# Patient Record
Sex: Female | Born: 1970 | Race: White | Hispanic: No | Marital: Married | Smoking: Former smoker
Health system: Southern US, Community
[De-identification: ages and names within clinical notes are randomized; demographics above are authoritative.]

## PROBLEM LIST (undated history)

## (undated) DIAGNOSIS — D649 Anemia, unspecified: Secondary | ICD-10-CM

## (undated) DIAGNOSIS — E119 Type 2 diabetes mellitus without complications: Secondary | ICD-10-CM

## (undated) DIAGNOSIS — I10 Essential (primary) hypertension: Secondary | ICD-10-CM

## (undated) DIAGNOSIS — E039 Hypothyroidism, unspecified: Secondary | ICD-10-CM

## (undated) DIAGNOSIS — E7800 Pure hypercholesterolemia, unspecified: Secondary | ICD-10-CM

## (undated) DIAGNOSIS — E079 Disorder of thyroid, unspecified: Secondary | ICD-10-CM

## (undated) DIAGNOSIS — K5790 Diverticulosis of intestine, part unspecified, without perforation or abscess without bleeding: Secondary | ICD-10-CM

## (undated) DIAGNOSIS — J45909 Unspecified asthma, uncomplicated: Secondary | ICD-10-CM

## (undated) DIAGNOSIS — K640 First degree hemorrhoids: Secondary | ICD-10-CM

## (undated) DIAGNOSIS — J189 Pneumonia, unspecified organism: Secondary | ICD-10-CM

## (undated) DIAGNOSIS — IMO0001 Reserved for inherently not codable concepts without codable children: Secondary | ICD-10-CM

## (undated) DIAGNOSIS — K219 Gastro-esophageal reflux disease without esophagitis: Secondary | ICD-10-CM

## (undated) DIAGNOSIS — R011 Cardiac murmur, unspecified: Secondary | ICD-10-CM

## (undated) DIAGNOSIS — E282 Polycystic ovarian syndrome: Secondary | ICD-10-CM

## (undated) HISTORY — DX: Anemia, unspecified: D64.9

## (undated) HISTORY — DX: Unspecified asthma, uncomplicated: J45.909

## (undated) HISTORY — DX: Type 2 diabetes mellitus without complications: E11.9

## (undated) HISTORY — PX: SESMOIDECTOMY: SHX6205

## (undated) HISTORY — DX: Essential (primary) hypertension: I10

## (undated) HISTORY — DX: Disorder of thyroid, unspecified: E07.9

## (undated) HISTORY — DX: Polycystic ovarian syndrome: E28.2

## (undated) HISTORY — DX: First degree hemorrhoids: K64.0

## (undated) HISTORY — DX: Diverticulosis of intestine, part unspecified, without perforation or abscess without bleeding: K57.90

## (undated) HISTORY — DX: Reserved for inherently not codable concepts without codable children: IMO0001

## (undated) HISTORY — DX: Gastro-esophageal reflux disease without esophagitis: K21.9

## (undated) HISTORY — DX: Morbid (severe) obesity due to excess calories: E66.01

## (undated) HISTORY — DX: Pure hypercholesterolemia, unspecified: E78.00

---

## 1997-08-21 HISTORY — PX: CHOLECYSTECTOMY: SHX55

## 2003-08-18 HISTORY — PX: DILATION AND CURETTAGE OF UTERUS: SHX78

## 2005-08-21 HISTORY — PX: BILATERAL CARPAL TUNNEL RELEASE: SHX6508

## 2006-08-01 ENCOUNTER — Ambulatory Visit: Payer: Self-pay | Admitting: Unknown Physician Specialty

## 2006-08-03 LAB — HM MAMMOGRAPHY

## 2008-02-11 ENCOUNTER — Ambulatory Visit: Payer: Self-pay | Admitting: General Practice

## 2008-03-18 ENCOUNTER — Ambulatory Visit: Payer: Self-pay | Admitting: General Practice

## 2010-10-16 ENCOUNTER — Ambulatory Visit: Payer: Self-pay | Admitting: Pediatrics

## 2011-07-03 DIAGNOSIS — E039 Hypothyroidism, unspecified: Secondary | ICD-10-CM | POA: Insufficient documentation

## 2011-07-03 DIAGNOSIS — J45909 Unspecified asthma, uncomplicated: Secondary | ICD-10-CM | POA: Insufficient documentation

## 2011-07-03 DIAGNOSIS — E119 Type 2 diabetes mellitus without complications: Secondary | ICD-10-CM | POA: Insufficient documentation

## 2011-07-03 DIAGNOSIS — K219 Gastro-esophageal reflux disease without esophagitis: Secondary | ICD-10-CM | POA: Insufficient documentation

## 2011-07-03 DIAGNOSIS — E282 Polycystic ovarian syndrome: Secondary | ICD-10-CM | POA: Insufficient documentation

## 2011-07-03 DIAGNOSIS — E7800 Pure hypercholesterolemia, unspecified: Secondary | ICD-10-CM | POA: Insufficient documentation

## 2011-07-03 HISTORY — DX: Pure hypercholesterolemia, unspecified: E78.00

## 2012-01-25 ENCOUNTER — Ambulatory Visit: Payer: Self-pay | Admitting: Family Medicine

## 2013-01-06 ENCOUNTER — Ambulatory Visit: Payer: Self-pay

## 2013-03-25 ENCOUNTER — Ambulatory Visit: Payer: Self-pay

## 2013-07-11 ENCOUNTER — Encounter: Payer: Self-pay | Admitting: Podiatry

## 2013-07-11 ENCOUNTER — Ambulatory Visit (INDEPENDENT_AMBULATORY_CARE_PROVIDER_SITE_OTHER): Payer: 59 | Admitting: Podiatry

## 2013-07-11 ENCOUNTER — Ambulatory Visit (INDEPENDENT_AMBULATORY_CARE_PROVIDER_SITE_OTHER): Payer: 59

## 2013-07-11 VITALS — BP 135/85 | HR 85 | Resp 16 | Ht 69.0 in | Wt 260.0 lb

## 2013-07-11 DIAGNOSIS — M79671 Pain in right foot: Secondary | ICD-10-CM

## 2013-07-11 DIAGNOSIS — M79609 Pain in unspecified limb: Secondary | ICD-10-CM

## 2013-07-11 DIAGNOSIS — M722 Plantar fascial fibromatosis: Secondary | ICD-10-CM

## 2013-07-11 MED ORDER — TRIAMCINOLONE ACETONIDE 10 MG/ML IJ SUSP
5.0000 mg | Freq: Once | INTRAMUSCULAR | Status: AC
  Administered 2013-07-11: 5 mg via INTRA_ARTICULAR

## 2013-07-11 MED ORDER — DICLOFENAC SODIUM 75 MG PO TBEC: 75.0000 mg | DELAYED_RELEASE_TABLET | Freq: Two times a day (BID) | ORAL | Status: DC
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
## 2013-07-11 NOTE — Patient Instructions (Signed)
Plantar Fasciitis (Heel Spur Syndrome) with Rehab The plantar fascia is a fibrous, ligament-like, soft-tissue structure that spans the bottom of the foot. Plantar fasciitis is a condition that causes pain in the foot due to inflammation of the tissue. SYMPTOMS   Pain and tenderness on the underneath side of the foot.  Pain that worsens with standing or walking. CAUSES  Plantar fasciitis is caused by irritation and injury to the plantar fascia on the underneath side of the foot. Common mechanisms of injury include:  Direct trauma to bottom of the foot.  Damage to a small nerve that runs under the foot where the main fascia attaches to the heel bone.  Stress placed on the plantar fascia due to bone spurs. RISK INCREASES WITH:   Activities that place stress on the plantar fascia (running, jumping, pivoting, or cutting).  Poor strength and flexibility.  Improperly fitted shoes.  Tight calf muscles.  Flat feet.  Failure to warm-up properly before activity.  Obesity. PREVENTION  Warm up and stretch properly before activity.  Allow for adequate recovery between workouts.  Maintain physical fitness:  Strength, flexibility, and endurance.  Cardiovascular fitness.  Maintain a health body weight.  Avoid stress on the plantar fascia.  Wear properly fitted shoes, including arch supports for individuals who have flat feet. PROGNOSIS  If treated properly, then the symptoms of plantar fasciitis usually resolve without surgery. However, occasionally surgery is necessary. RELATED COMPLICATIONS   Recurrent symptoms that may result in a chronic condition.  Problems of the lower back that are caused by compensating for the injury, such as limping.  Pain or weakness of the foot during push-off following surgery.  Chronic inflammation, scarring, and partial or complete fascia tear, occurring more often from repeated injections. TREATMENT  Treatment initially involves the use of  ice and medication to help reduce pain and inflammation. The use of strengthening and stretching exercises may help reduce pain with activity, especially stretches of the Achilles tendon. These exercises may be performed at home or with a therapist. Your caregiver may recommend that you use heel cups of arch supports to help reduce stress on the plantar fascia. Occasionally, corticosteroid injections are given to reduce inflammation. If symptoms persist for greater than 6 months despite non-surgical (conservative), then surgery may be recommended.  MEDICATION   If pain medication is necessary, then nonsteroidal anti-inflammatory medications, such as aspirin and ibuprofen, or other minor pain relievers, such as acetaminophen, are often recommended.  Do not take pain medication within 7 days before surgery.  Prescription pain relievers may be given if deemed necessary by your caregiver. Use only as directed and only as much as you need.  Corticosteroid injections may be given by your caregiver. These injections should be reserved for the most serious cases, because they may only be given a certain number of times. HEAT AND COLD  Cold treatment (icing) relieves pain and reduces inflammation. Cold treatment should be applied for 10 to 15 minutes every 2 to 3 hours for inflammation and pain and immediately after any activity that aggravates your symptoms. Use ice packs or massage the area with a piece of ice (ice massage).  Heat treatment may be used prior to performing the stretching and strengthening activities prescribed by your caregiver, physical therapist, or athletic trainer. Use a heat pack or soak the injury in warm water. SEEK IMMEDIATE MEDICAL CARE IF:  Treatment seems to offer no benefit, or the condition worsens.  Any medications produce adverse side effects. EXERCISES RANGE   OF MOTION (ROM) AND STRETCHING EXERCISES - Plantar Fasciitis (Heel Spur Syndrome) These exercises may help you  when beginning to rehabilitate your injury. Your symptoms may resolve with or without further involvement from your physician, physical therapist or athletic trainer. While completing these exercises, remember:   Restoring tissue flexibility helps normal motion to return to the joints. This allows healthier, less painful movement and activity.  An effective stretch should be held for at least 30 seconds.  A stretch should never be painful. You should only feel a gentle lengthening or release in the stretched tissue. RANGE OF MOTION - Toe Extension, Flexion  Sit with your right / left leg crossed over your opposite knee.  Grasp your toes and gently pull them back toward the top of your foot. You should feel a stretch on the bottom of your toes and/or foot.  Hold this stretch for __________ seconds.  Now, gently pull your toes toward the bottom of your foot. You should feel a stretch on the top of your toes and or foot.  Hold this stretch for __________ seconds. Repeat __________ times. Complete this stretch __________ times per day.  RANGE OF MOTION - Ankle Dorsiflexion, Active Assisted  Remove shoes and sit on a chair that is preferably not on a carpeted surface.  Place right / left foot under knee. Extend your opposite leg for support.  Keeping your heel down, slide your right / left foot back toward the chair until you feel a stretch at your ankle or calf. If you do not feel a stretch, slide your bottom forward to the edge of the chair, while still keeping your heel down.  Hold this stretch for __________ seconds. Repeat __________ times. Complete this stretch __________ times per day.  STRETCH  Gastroc, Standing  Place hands on wall.  Extend right / left leg, keeping the front knee somewhat bent.  Slightly point your toes inward on your back foot.  Keeping your right / left heel on the floor and your knee straight, shift your weight toward the wall, not allowing your back to  arch.  You should feel a gentle stretch in the right / left calf. Hold this position for __________ seconds. Repeat __________ times. Complete this stretch __________ times per day. STRETCH  Soleus, Standing  Place hands on wall.  Extend right / left leg, keeping the other knee somewhat bent.  Slightly point your toes inward on your back foot.  Keep your right / left heel on the floor, bend your back knee, and slightly shift your weight over the back leg so that you feel a gentle stretch deep in your back calf.  Hold this position for __________ seconds. Repeat __________ times. Complete this stretch __________ times per day. STRETCH  Gastrocsoleus, Standing  Note: This exercise can place a lot of stress on your foot and ankle. Please complete this exercise only if specifically instructed by your caregiver.   Place the ball of your right / left foot on a step, keeping your other foot firmly on the same step.  Hold on to the wall or a rail for balance.  Slowly lift your other foot, allowing your body weight to press your heel down over the edge of the step.  You should feel a stretch in your right / left calf.  Hold this position for __________ seconds.  Repeat this exercise with a slight bend in your right / left knee. Repeat __________ times. Complete this stretch __________ times per day.    STRENGTHENING EXERCISES - Plantar Fasciitis (Heel Spur Syndrome)  These exercises may help you when beginning to rehabilitate your injury. They may resolve your symptoms with or without further involvement from your physician, physical therapist or athletic trainer. While completing these exercises, remember:   Muscles can gain both the endurance and the strength needed for everyday activities through controlled exercises.  Complete these exercises as instructed by your physician, physical therapist or athletic trainer. Progress the resistance and repetitions only as guided. STRENGTH - Towel  Curls  Sit in a chair positioned on a non-carpeted surface.  Place your foot on a towel, keeping your heel on the floor.  Pull the towel toward your heel by only curling your toes. Keep your heel on the floor.  If instructed by your physician, physical therapist or athletic trainer, add ____________________ at the end of the towel. Repeat __________ times. Complete this exercise __________ times per day. STRENGTH - Ankle Inversion  Secure one end of a rubber exercise band/tubing to a fixed object (table, pole). Loop the other end around your foot just before your toes.  Place your fists between your knees. This will focus your strengthening at your ankle.  Slowly, pull your big toe up and in, making sure the band/tubing is positioned to resist the entire motion.  Hold this position for __________ seconds.  Have your muscles resist the band/tubing as it slowly pulls your foot back to the starting position. Repeat __________ times. Complete this exercises __________ times per day.  Document Released: 08/07/2005 Document Revised: 10/30/2011 Document Reviewed: 11/19/2008 [employer] Patient Information 2014 [employer]. Plantar Fasciitis Plantar fasciitis is a common condition that causes foot pain. It is soreness (inflammation) of the band of tough fibrous tissue on the bottom of the foot that runs from the heel bone (calcaneus) to the ball of the foot. The cause of this soreness may be from excessive standing, poor fitting shoes, running on hard surfaces, being overweight, having an abnormal walk, or overuse (this is common in runners) of the painful foot or feet. It is also common in aerobic exercise dancers and ballet dancers. SYMPTOMS  Most people with plantar fasciitis complain of:  Severe pain in the morning on the bottom of their foot especially when taking the first steps out of bed. This pain recedes after a few minutes of walking.  Severe pain is experienced also during walking  following a long period of inactivity.  Pain is worse when walking barefoot or up stairs DIAGNOSIS   Your caregiver will diagnose this condition by examining and feeling your foot.  Special tests such as X-rays of your foot, are usually not needed. PREVENTION   Consult a sports medicine professional before beginning a new exercise program.  Walking programs offer a good workout. With walking there is a lower chance of overuse injuries common to runners. There is less impact and less jarring of the joints.  Begin all new exercise programs slowly. If problems or pain develop, decrease the amount of time or distance until you are at a comfortable level.  Wear good shoes and replace them regularly.  Stretch your foot and the heel cords at the back of the ankle (Achilles tendon) both before and after exercise.  Run or exercise on even surfaces that are not hard. For example, asphalt is better than pavement.  Do not run barefoot on hard surfaces.  If using a treadmill, vary the incline.  Do not continue to workout if you have foot or joint   problems. Seek professional help if they do not improve. HOME CARE INSTRUCTIONS   Avoid activities that cause you pain until you recover.  Use ice or cold packs on the problem or painful areas after working out.  Only take over-the-counter or prescription medicines for pain, discomfort, or fever as directed by your caregiver.  Soft shoe inserts or athletic shoes with air or gel sole cushions may be helpful.  If problems continue or become more severe, consult a sports medicine caregiver or your own health care provider. Cortisone is a potent anti-inflammatory medication that may be injected into the painful area. You can discuss this treatment with your caregiver. MAKE SURE YOU:   Understand these instructions.  Will watch your condition.  Will get help right away if you are not doing well or get worse. Document Released: 05/02/2001 Document  Revised: 10/30/2011 Document Reviewed: 07/01/2008 Exit[employer] Patient Information 2014 [employer].  
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
## 2013-07-25 ENCOUNTER — Ambulatory Visit (INDEPENDENT_AMBULATORY_CARE_PROVIDER_SITE_OTHER): Payer: 59 | Admitting: Podiatry

## 2013-07-25 ENCOUNTER — Encounter: Payer: Self-pay | Admitting: Podiatry

## 2013-07-25 VITALS — BP 155/91 | HR 82 | Resp 16 | Ht 69.0 in | Wt 265.0 lb

## 2013-07-25 DIAGNOSIS — M722 Plantar fascial fibromatosis: Secondary | ICD-10-CM

## 2013-07-25 MED ORDER — TRIAMCINOLONE ACETONIDE 10 MG/ML IJ SUSP
10.0000 mg | Freq: Once | INTRAMUSCULAR | Status: AC
  Administered 2013-07-25: 10 mg

## 2013-07-26 NOTE — Progress Notes (Signed)
Subjective:     Patient ID: Amber Stanley, female   DOB: 07/18/1971, 42 y.o.   MRN: [id number]  HPI patient states IM improved but not well as far as my right arch goes   Review of Systems     Objective:   Physical Exam Neurovascular status intact with pain in the right arch distal to the insertion noted right foot still present but improved    Assessment:     Improving plantar fasciitis right with inflammation still present    Plan:     Reinjected the plantar fascia 3 mg Kenalog 5 mg Xylocaine Marcaine mixture and advised on stretching exercises ice and supportive shoes. Reappoint if indicate

## 2014-12-04 ENCOUNTER — Other Ambulatory Visit: Payer: Self-pay | Admitting: Family Medicine

## 2014-12-04 DIAGNOSIS — Z0000 Encounter for general adult medical examination without abnormal findings: Secondary | ICD-10-CM

## 2015-01-04 ENCOUNTER — Ambulatory Visit
Admission: RE | Admit: 2015-01-04 | Discharge: 2015-01-04 | Disposition: A | Discharge status: Home / Self Care | Payer: 59 | Source: Ambulatory Visit | Attending: Family Medicine | Admitting: Family Medicine

## 2015-01-04 DIAGNOSIS — Z1231 Encounter for screening mammogram for malignant neoplasm of breast: Secondary | ICD-10-CM | POA: Insufficient documentation

## 2015-01-04 DIAGNOSIS — Z0000 Encounter for general adult medical examination without abnormal findings: Secondary | ICD-10-CM

## 2015-03-30 ENCOUNTER — Other Ambulatory Visit: Payer: Self-pay | Admitting: Family Medicine

## 2015-03-30 NOTE — Telephone Encounter (Signed)
Pt saw Dr. Jeananne Rama for physical in Feb; routing Rx request to him

## 2015-03-30 NOTE — Telephone Encounter (Signed)
Routing to provider  

## 2015-05-18 ENCOUNTER — Other Ambulatory Visit: Payer: Self-pay | Admitting: Unknown Physician Specialty

## 2015-05-18 NOTE — Telephone Encounter (Signed)
Your patient.  Thanks 

## 2015-08-26 ENCOUNTER — Encounter: Payer: Self-pay | Admitting: Family Medicine

## 2015-08-26 ENCOUNTER — Ambulatory Visit (INDEPENDENT_AMBULATORY_CARE_PROVIDER_SITE_OTHER): Payer: 59 | Admitting: Family Medicine

## 2015-08-26 VITALS — BP 156/82 | HR 77 | Temp 97.5°F | Ht 68.0 in | Wt 253.0 lb

## 2015-08-26 DIAGNOSIS — E119 Type 2 diabetes mellitus without complications: Secondary | ICD-10-CM | POA: Diagnosis not present

## 2015-08-26 DIAGNOSIS — Z794 Long term (current) use of insulin: Secondary | ICD-10-CM

## 2015-08-26 DIAGNOSIS — R062 Wheezing: Secondary | ICD-10-CM

## 2015-08-26 DIAGNOSIS — IMO0001 Reserved for inherently not codable concepts without codable children: Secondary | ICD-10-CM

## 2015-08-26 DIAGNOSIS — E669 Obesity, unspecified: Secondary | ICD-10-CM

## 2015-08-26 DIAGNOSIS — J208 Acute bronchitis due to other specified organisms: Secondary | ICD-10-CM | POA: Diagnosis not present

## 2015-08-26 DIAGNOSIS — R030 Elevated blood-pressure reading, without diagnosis of hypertension: Secondary | ICD-10-CM

## 2015-08-26 DIAGNOSIS — J069 Acute upper respiratory infection, unspecified: Secondary | ICD-10-CM | POA: Diagnosis not present

## 2015-08-26 HISTORY — DX: Morbid (severe) obesity due to excess calories: E66.01

## 2015-08-26 MED ORDER — PREDNISONE 10 MG PO TABS: ORAL_TABLET | ORAL | Status: DC

## 2015-08-26 MED ORDER — ALBUTEROL SULFATE HFA 108 (90 BASE) MCG/ACT IN AERS: 2.0000 | INHALATION_SPRAY | RESPIRATORY_TRACT | Status: DC | PRN

## 2015-08-26 MED ORDER — ADVAIR DISKUS 100-50 MCG/DOSE IN AEPB: 1.0000 | INHALATION_SPRAY | Freq: Two times a day (BID) | RESPIRATORY_TRACT | Status: DC

## 2015-08-26 NOTE — Patient Instructions (Addendum)
Try vitamin C (orange juice if not diabetic or vitamin C tablets) and drink green tea to help your immune system during your illness Get plenty of rest and hydration  Try to use PLAIN allergy or cold medicine without the decongestant Avoid: phenylephrine, phenylpropanolamine, and pseudoephredine Try Mucinex DM for cough and to help loosen phlegm  Call us if needed  Your goal blood pressure is less than 130 mmHg on top. Try to follow the DASH guidelines (DASH stands for Dietary Approaches to Stop Hypertension) Try to limit the sodium in your diet.  Ideally, consume less than 1.5 grams (less than 1,500mg ) per day. Do not add salt when cooking or at the table.  Check the sodium amount on labels when shopping, and choose items lower in sodium when given a choice. Avoid or limit foods that already contain a lot of sodium. Eat a diet rich in fruits and vegetables and whole grains. Keep an eye on your blood pressure and call us if not at goal  [place] stands for "Dietary Approaches to Stop Hypertension." The DASH eating plan is a healthy eating plan that has been shown to reduce high blood pressure (hypertension). Additional health benefits may include reducing the risk of type 2 diabetes mellitus, heart disease, and stroke. The DASH eating plan may also help with weight loss. WHAT DO I NEED TO KNOW ABOUT THE DASH EATING PLAN? For the DASH eating plan, you will follow these general guidelines:  Choose foods with a percent daily value for sodium of less than 5% (as listed on the food label).  Use salt-free seasonings or herbs instead of table salt or sea salt.  Check with your health care provider or pharmacist before using salt substitutes.  Eat lower-sodium products, often labeled as "lower sodium" or "no salt added."  Eat fresh foods.  Eat more vegetables, fruits, and low-fat dairy products.  Choose whole grains. Look for the word "whole" as the first word in the ingredient  list.  Choose fish and skinless chicken or [place] more often than red meat. Limit fish, poultry, and meat to 6 oz (170 g) each day.  Limit sweets, desserts, sugars, and sugary drinks.  Choose heart-healthy fats.  Limit cheese to 1 oz (28 g) per day.  Eat more home-cooked food and less restaurant, buffet, and fast food.  Limit fried foods.  Cook foods using methods other than frying.  Limit canned vegetables. If you do use them, rinse them well to decrease the sodium.  When eating at a restaurant, ask that your food be prepared with less salt, or no salt if possible. WHAT FOODS CAN I EAT? Seek help from a dietitian for individual calorie needs. Grains Whole grain or whole wheat bread. Brown rice. Whole grain or whole wheat pasta. Quinoa, bulgur, and whole grain cereals. Low-sodium cereals. Corn or whole wheat flour tortillas. Whole grain cornbread. Whole grain crackers. Low-sodium crackers. Vegetables Fresh or frozen vegetables (raw, steamed, roasted, or grilled). Low-sodium or reduced-sodium tomato and vegetable juices. Low-sodium or reduced-sodium tomato sauce and paste. Low-sodium or reduced-sodium canned vegetables.  Fruits All fresh, canned (in natural juice), or frozen fruits. Meat and Other Protein Products Ground beef (85% or leaner), grass-fed beef, or beef trimmed of fat. Skinless chicken or [place]. Ground chicken or [place]. Pork trimmed of fat. All fish and seafood. Eggs. Dried beans, peas, or lentils. Unsalted nuts and seeds. Unsalted canned beans. Dairy Low-fat dairy products, such as skim or 1% milk, 2% or reduced-fat  cheeses, low-fat ricotta or cottage cheese, or plain low-fat yogurt. Low-sodium or reduced-sodium cheeses. Fats and Oils Tub margarines without trans fats. Light or reduced-fat mayonnaise and salad dressings (reduced sodium). Avocado. Safflower, olive, or canola oils. Natural peanut or almond butter. Other Unsalted popcorn and pretzels. The items listed  above may not be a complete list of recommended foods or beverages. Contact your dietitian for more options. WHAT FOODS ARE NOT RECOMMENDED? Grains White bread. White pasta. White rice. Refined cornbread. Bagels and croissants. Crackers that contain trans fat. Vegetables Creamed or fried vegetables. Vegetables in a cheese sauce. Regular canned vegetables. Regular canned tomato sauce and paste. Regular tomato and vegetable juices. Fruits Dried fruits. Canned fruit in light or heavy syrup. Fruit juice. Meat and Other Protein Products Fatty cuts of meat. Ribs, chicken wings, bacon, sausage, bologna, salami, chitterlings, fatback, hot dogs, bratwurst, and packaged luncheon meats. Salted nuts and seeds. Canned beans with salt. Dairy Whole or 2% milk, cream, half-and-half, and cream cheese. Whole-fat or sweetened yogurt. Full-fat cheeses or blue cheese. Nondairy creamers and whipped toppings. Processed cheese, cheese spreads, or cheese curds. Condiments Onion and garlic salt, seasoned salt, table salt, and sea salt. Canned and packaged gravies. Worcestershire sauce. Tartar sauce. Barbecue sauce. Teriyaki sauce. Soy sauce, including reduced sodium. Steak sauce. Fish sauce. Oyster sauce. Cocktail sauce. Horseradish. Ketchup and mustard. Meat flavorings and tenderizers. Bouillon cubes. Hot sauce. Tabasco sauce. Marinades. Taco seasonings. Relishes. Fats and Oils Butter, stick margarine, lard, shortening, ghee, and bacon fat. Coconut, palm kernel, or palm oils. Regular salad dressings. Other Pickles and olives. Salted popcorn and pretzels. The items listed above may not be a complete list of foods and beverages to avoid. Contact your dietitian for more information. WHERE CAN I FIND MORE INFORMATION? National Heart, Lung, and Blood Institute: travelstabloid.com   This information is not intended to replace advice given to you by your health care provider. Make sure you  discuss any questions you have with your health care provider.   Document Released: 07/27/2011 Document Revised: 08/28/2014 Document Reviewed: 06/11/2013 Elsevier Interactive Patient Education [employer].

## 2015-08-26 NOTE — Assessment & Plan Note (Signed)
Encouragement given, glad to hear she is going to get back into Weight Watchers

## 2015-08-26 NOTE — Progress Notes (Signed)
BP 156/82 mmHg  Pulse 77  Temp(Src) 97.5 F (36.4 C)  Ht 5\' 8"  (1.727 m)  Wt 253 lb (114.76 kg)  BMI 38.48 kg/m2  SpO2 98%  LMP 08/25/2015 (Exact Date)   Subjective:    Patient ID: Amber Stanley, female    DOB: Jan 04, 1971, 45 y.o.   MRN: [id number]  HPI: Amber Stanley is a 45 y.o. female  Chief Complaint  Patient presents with  . URI    Sick since Christmas Eve, started out as a snotty head, but now moved into chest, coughing. No fever.   She got sick christmas Eve, started just as "snot"; she gets nighttime snot; now tight and stuff in her chest and lungs; coughing and carrying on; she is wheezing; using her rescue inhaler every four hours No travel anywhere Creeping crud crept through her house before Christmas; her daughter goes to daycare (she is 56 years old) She denies body aches She denies rash Ears are not bothering her but tubes underneath are; no sore throat now, just from drainage; having some headache  She is taking tylenol for sinus/allergy she thinks; can't remember the name; does think it has a decongestant in it; she doesn't usually have high blood pressure; I reveiwed Care Everywhere; her blood pressure at endocrinologist's office in August was 112/70; she says she was really working on her obesity then, and was working [employer]; she has slacked off on that, but is going to get back into [employer] again  She has been on steroids before and they don't affect her sugars, just keep her from sleeping  She has asthma and has Advair on order, but she's been out of it; she probably hasn't had it since October or so; she knows now she can't go off of that inhaler; Malachy Mood sees her for this; got a flu shot this year  Relevant past medical, surgical, family and social history reviewed and updated as indicated Past Medical History  Diagnosis Date  . Diabetes mellitus without complication ([place])   . Asthma   . Reflux   . Hypertension   . Thyroid disease     Past Surgical History  Procedure Laterality Date  . Cesarean section    . Cholecystectomy    . Bilateral carpal tunnel release    . Sesmoidectomy      removed from foot  . Dilation and curettage of uterus     Social History  Substance Use Topics  . Smoking status: Former Smoker -- 1.50 packs/day for 20 years    Types: Cigarettes    Quit date: 09/10/2005  . Smokeless tobacco: Never Used     Comment: quit 7 years ago   . Alcohol Use: No   Interim medical history since our last visit reviewed.  Allergies and medications reviewed and updated.  Review of Systems Per HPI unless specifically indicated above     Objective:    BP 156/82 mmHg  Pulse 77  Temp(Src) 97.5 F (36.4 C)  Ht 5\' 8"  (1.727 m)  Wt 253 lb (114.76 kg)  BMI 38.48 kg/m2  SpO2 98%  LMP 08/25/2015 (Exact Date)  Wt Readings from Last 3 Encounters:  08/26/15 253 lb (114.76 kg)  07/25/13 265 lb (120.203 kg)  07/11/13 260 lb (117.935 kg)    Today's Vitals   08/26/15 1035 08/26/15 1056  BP: 156/88 156/82  Pulse: 82 77  Temp: 97.5 F (36.4 C)   Height: 5\' 8"  (1.727 m)   Weight: 253 lb (  114.76 kg)   SpO2: 98%     Physical Exam  Constitutional: She appears well-developed and well-nourished.  obese  HENT:  Head: Normocephalic and atraumatic.  Right Ear: External ear normal. No drainage. Tympanic membrane is not erythematous.  Left Ear: External ear normal. No drainage. Tympanic membrane is not erythematous.  Nose: Rhinorrhea (scant clear) present.  Mouth/Throat: Oropharynx is clear and moist. Mucous membranes are not dry. No oropharyngeal exudate, posterior oropharyngeal edema or posterior oropharyngeal erythema.  Eyes: EOM are normal. Right eye exhibits no discharge. Left eye exhibits no discharge. No scleral icterus.  Cardiovascular: Normal rate and regular rhythm.   Pulmonary/Chest: Effort normal. No respiratory distress. She has wheezes. She has no rales.  Abdominal: She exhibits no distension.   Lymphadenopathy:    She has no cervical adenopathy.  Neurological: She is alert.  Skin: Skin is warm and dry. No rash noted. No erythema.  Psychiatric: She has a normal mood and affect.      Assessment & Plan:   Problem List Items Addressed This Visit      Endocrine   Type 2 diabetes mellitus ([place])    Patient reports that steroid tapers do not routinely cause her sugars to skyrocket; will start with 50 mg taper and work down      Relevant Medications   glucagon (GLUCAGON EMERGENCY) 1 MG injection   insulin aspart (NOVOLOG) 100 UNIT/ML injection     Other   Obesity    Encouragement given, glad to hear she is going to get back into [employer]      Relevant Medications   glucagon (GLUCAGON EMERGENCY) 1 MG injection   insulin aspart (NOVOLOG) 100 UNIT/ML injection    Other Visit Diagnoses    Upper respiratory infection    -  Primary    explained viral etiology; no need for antibiotics at this time; see AVS; she is contagious, so use caution    Acute viral bronchitis        explained viral, no antibiotic needed; rest, hydration, watch for s/s of secondary bacterial infection, call if needed    Wheezing        continue inhalers; explained Advair best used regularly, not prn; rx given; short course of steroid taper given; call if needed    Elevated blood pressure        suspect related to her decongestant, as last BP at endo was normal; check BP, follow DASH guidelines; no cold medicine worth a stroke; call if not to goal        Follow up plan: No Follow-up on file.  An after-visit summary was printed and given to the patient at [facility].  Please see the patient instructions which may contain other information and recommendations beyond what is mentioned above in the assessment and plan.  Meds ordered this encounter  Medications  . Flaxseed MISC    Sig: Take by mouth daily.  Marland Kitchen glucagon (GLUCAGON EMERGENCY) 1 MG injection    Sig: Inject into the muscle.  Marland Kitchen  DISCONTD: ADVAIR DISKUS 100-50 MCG/DOSE AEPB    Sig:   . insulin aspart (NOVOLOG) 100 UNIT/ML injection    Sig: To use with insulin pump; dispense 18 vials for 3 month supply  . DISCONTD: albuterol (PROAIR HFA) 108 (90 Base) MCG/ACT inhaler    Sig:   . ADVAIR DISKUS 100-50 MCG/DOSE AEPB    Sig: Inhale 1 puff into the lungs 2 (two) times daily.    Dispense:  60 each  Refill:  1  . albuterol (PROAIR HFA) 108 (90 Base) MCG/ACT inhaler    Sig: Inhale 2 puffs into the lungs every 4 (four) hours as needed for wheezing or shortness of breath.    Dispense:  3 Inhaler    Refill:  0  . predniSONE (DELTASONE) 10 MG tablet    Sig: Take five pills by mouth today, then 4 on day 2, then 3 on day 3, then 2 on day 4, and 1 on day 5; take with food    Dispense:  15 tablet    Refill:  0

## 2015-08-26 NOTE — Assessment & Plan Note (Signed)
Patient reports that steroid tapers do not routinely cause her sugars to skyrocket; will start with 50 mg taper and work down

## 2015-11-19 ENCOUNTER — Other Ambulatory Visit: Payer: Self-pay | Admitting: Family Medicine

## 2016-01-07 ENCOUNTER — Other Ambulatory Visit: Payer: Self-pay | Admitting: Family Medicine

## 2016-01-19 ENCOUNTER — Encounter: Payer: Self-pay | Admitting: Family Medicine

## 2016-01-19 ENCOUNTER — Ambulatory Visit (INDEPENDENT_AMBULATORY_CARE_PROVIDER_SITE_OTHER): Payer: 59 | Admitting: Family Medicine

## 2016-01-19 VITALS — BP 118/68 | HR 83 | Temp 98.2°F | Resp 20 | Ht 68.0 in | Wt 267.1 lb

## 2016-01-19 DIAGNOSIS — Z1239 Encounter for other screening for malignant neoplasm of breast: Secondary | ICD-10-CM

## 2016-01-19 DIAGNOSIS — R5383 Other fatigue: Secondary | ICD-10-CM

## 2016-01-19 DIAGNOSIS — Z0000 Encounter for general adult medical examination without abnormal findings: Secondary | ICD-10-CM | POA: Diagnosis not present

## 2016-01-19 DIAGNOSIS — E7800 Pure hypercholesterolemia, unspecified: Secondary | ICD-10-CM | POA: Diagnosis not present

## 2016-01-19 DIAGNOSIS — E038 Other specified hypothyroidism: Secondary | ICD-10-CM | POA: Diagnosis not present

## 2016-01-19 DIAGNOSIS — E119 Type 2 diabetes mellitus without complications: Secondary | ICD-10-CM

## 2016-01-19 DIAGNOSIS — E034 Atrophy of thyroid (acquired): Secondary | ICD-10-CM

## 2016-01-19 NOTE — Patient Instructions (Addendum)
We'll get labs done today If you have not heard anything from my staff in a week about any orders/referrals/studies from today, please contact us here to follow-up [phone] Please do call to schedule your mammogram; the number to schedule one at either [facility] or [facility] is [phone]  Health Maintenance, Female Adopting a healthy lifestyle and getting preventive care can go a long way to promote health and wellness. Talk with your health care provider about what schedule of regular examinations is right for you. This is a good chance for you to check in with your provider about disease prevention and staying healthy. In between checkups, there are plenty of things you can do on your own. Experts have done a lot of research about which lifestyle changes and preventive measures are most likely to keep you healthy. Ask your health care provider for more information. WEIGHT AND DIET  Eat a healthy diet  Be sure to include plenty of vegetables, fruits, low-fat dairy products, and lean protein.  Do not eat a lot of foods high in solid fats, added sugars, or salt.  Get regular exercise. This is one of the most important things you can do for your health.  Most adults should exercise for at least 150 minutes each week. The exercise should increase your heart rate and make you sweat (moderate-intensity exercise).  Most adults should also do strengthening exercises at least twice a week. This is in addition to the moderate-intensity exercise.  Maintain a healthy weight  Body mass index (BMI) is a measurement that can be used to identify possible weight problems. It estimates body fat based on height and weight. Your health care provider can help determine your BMI and help you achieve or maintain a healthy weight.  For females 39 years of age and older:   A BMI below 18.5 is considered underweight.  A BMI of 18.5 to 24.9 is normal.  A BMI of 25 to  29.9 is considered overweight.  A BMI of 30 and above is considered obese.  Watch levels of cholesterol and blood lipids  You should start having your blood tested for lipids and cholesterol at 45 years of age, then have this test every 5 years.  You may need to have your cholesterol levels checked more often if:  Your lipid or cholesterol levels are high.  You are older than 45 years of age.  You are at high risk for heart disease.  CANCER SCREENING   Lung Cancer  Lung cancer screening is recommended for adults 32-62 years old who are at high risk for lung cancer because of a history of smoking.  A yearly low-dose CT scan of the lungs is recommended for people who:  Currently smoke.  Have quit within the past 15 years.  Have at least a 30-pack-year history of smoking. A pack year is smoking an average of one pack of cigarettes a day for 1 year.  Yearly screening should continue until it has been 15 years since you quit.  Yearly screening should stop if you develop a health problem that would prevent you from having lung cancer treatment.  Breast Cancer  Practice breast self-awareness. This means understanding how your breasts normally appear and feel.  It also means doing regular breast self-exams. Let your health care provider know about any changes, no matter how small.  If you are in your 20s or 30s, you should have a clinical breast exam (CBE) by a health  care provider every 1-3 years as part of a regular health exam.  If you are 48 or older, have a CBE every year. Also consider having a breast X-ray (mammogram) every year.  If you have a family history of breast cancer, talk to your health care provider about genetic screening.  If you are at high risk for breast cancer, talk to your health care provider about having an MRI and a mammogram every year.  Breast cancer gene (BRCA) assessment is recommended for women who have family members with BRCA-related  cancers. BRCA-related cancers include:  Breast.  Ovarian.  Tubal.  Peritoneal cancers.  Results of the assessment will determine the need for genetic counseling and BRCA1 and BRCA2 testing. Cervical Cancer Your health care provider may recommend that you be screened regularly for cancer of the pelvic organs (ovaries, uterus, and vagina). This screening involves a pelvic examination, including checking for microscopic changes to the surface of your cervix (Pap test). You may be encouraged to have this screening done every 3 years, beginning at age 9.  For women ages 16-65, health care providers may recommend pelvic exams and Pap testing every 3 years, or they may recommend the Pap and pelvic exam, combined with testing for human papilloma virus (HPV), every 5 years. Some types of HPV increase your risk of cervical cancer. Testing for HPV may also be done on women of any age with unclear Pap test results.  Other health care providers may not recommend any screening for nonpregnant women who are considered low risk for pelvic cancer and who do not have symptoms. Ask your health care provider if a screening pelvic exam is right for you.  If you have had past treatment for cervical cancer or a condition that could lead to cancer, you need Pap tests and screening for cancer for at least 20 years after your treatment. If Pap tests have been discontinued, your risk factors (such as having a new sexual partner) need to be reassessed to determine if screening should resume. Some women have medical problems that increase the chance of getting cervical cancer. In these cases, your health care provider may recommend more frequent screening and Pap tests. Colorectal Cancer  This type of cancer can be detected and often prevented.  Routine colorectal cancer screening usually begins at 45 years of age and continues through 45 years of age.  Your health care provider may recommend screening at an earlier  age if you have risk factors for colon cancer.  Your health care provider may also recommend using home test kits to check for hidden blood in the stool.  A small camera at the end of a tube can be used to examine your colon directly (sigmoidoscopy or colonoscopy). This is done to check for the earliest forms of colorectal cancer.  Routine screening usually begins at age 36.  Direct examination of the colon should be repeated every 5-10 years through 45 years of age. However, you may need to be screened more often if early forms of precancerous polyps or small growths are found. Skin Cancer  Check your skin from head to toe regularly.  Tell your health care provider about any new moles or changes in moles, especially if there is a change in a mole's shape or color.  Also tell your health care provider if you have a mole that is larger than the size of a pencil eraser.  Always use sunscreen. Apply sunscreen liberally and repeatedly throughout the day.  Protect  yourself by wearing long sleeves, pants, a wide-brimmed hat, and sunglasses whenever you are outside. HEART DISEASE, DIABETES, AND HIGH BLOOD PRESSURE   High blood pressure causes heart disease and increases the risk of stroke. High blood pressure is more likely to develop in:  People who have blood pressure in the high end of the normal range (130-139/85-89 mm Hg).  People who are overweight or obese.  People who are African American.  If you are 2-2 years of age, have your blood pressure checked every 3-5 years. If you are 30 years of age or older, have your blood pressure checked every year. You should have your blood pressure measured twice--once when you are at a hospital or clinic, and once when you are not at a hospital or clinic. Record the average of the two measurements. To check your blood pressure when you are not at a hospital or clinic, you can use:  An automated blood pressure machine at a pharmacy.  A home  blood pressure monitor.  If you are between 66 years and 91 years old, ask your health care provider if you should take aspirin to prevent strokes.  Have regular diabetes screenings. This involves taking a blood sample to check your fasting blood sugar level.  If you are at a normal weight and have a low risk for diabetes, have this test once every three years after 45 years of age.  If you are overweight and have a high risk for diabetes, consider being tested at a younger age or more often. PREVENTING INFECTION  Hepatitis B  If you have a higher risk for hepatitis B, you should be screened for this virus. You are considered at high risk for hepatitis B if:  You were born in a country where hepatitis B is common. Ask your health care provider which countries are considered high risk.  Your parents were born in a high-risk country, and you have not been immunized against hepatitis B (hepatitis B vaccine).  You have HIV or AIDS.  You use needles to inject street drugs.  You live with someone who has hepatitis B.  You have had sex with someone who has hepatitis B.  You get hemodialysis treatment.  You take certain medicines for conditions, including cancer, organ transplantation, and autoimmune conditions. Hepatitis C  Blood testing is recommended for:  Everyone born from [birth year] through 1965.  Anyone with known risk factors for hepatitis C. Sexually transmitted infections (STIs)  You should be screened for sexually transmitted infections (STIs) including gonorrhea and chlamydia if:  You are sexually active and are younger than 45 years of age.  You are older than 45 years of age and your health care provider tells you that you are at risk for this type of infection.  Your sexual activity has changed since you were last screened and you are at an increased risk for chlamydia or gonorrhea. Ask your health care provider if you are at risk.  If you do not have HIV, but are at  risk, it may be recommended that you take a prescription medicine daily to prevent HIV infection. This is called pre-exposure prophylaxis (PrEP). You are considered at risk if:  You are sexually active and do not regularly use condoms or know the HIV status of your partner(s).  You take drugs by injection.  You are sexually active with a partner who has HIV. Talk with your health care provider about whether you are at high risk of being infected with  HIV. If you choose to begin PrEP, you should first be tested for HIV. You should then be tested every 3 months for as long as you are taking PrEP.  PREGNANCY   If you are premenopausal and you may become pregnant, ask your health care provider about preconception counseling.  If you may become pregnant, take 400 to 800 micrograms (mcg) of folic acid every day.  If you want to prevent pregnancy, talk to your health care provider about birth control (contraception). OSTEOPOROSIS AND MENOPAUSE   Osteoporosis is a disease in which the bones lose minerals and strength with aging. This can result in serious bone fractures. Your risk for osteoporosis can be identified using a bone density scan.  If you are 71 years of age or older, or if you are at risk for osteoporosis and fractures, ask your health care provider if you should be screened.  Ask your health care provider whether you should take a calcium or vitamin D supplement to lower your risk for osteoporosis.  Menopause may have certain physical symptoms and risks.  Hormone replacement therapy may reduce some of these symptoms and risks. Talk to your health care provider about whether hormone replacement therapy is right for you.  HOME CARE INSTRUCTIONS   Schedule regular health, dental, and eye exams.  Stay current with your immunizations.   Do not use any tobacco products including cigarettes, chewing tobacco, or electronic cigarettes.  If you are pregnant, do not drink alcohol.  If  you are breastfeeding, limit how much and how often you drink alcohol.  Limit alcohol intake to no more than 1 drink per day for nonpregnant women. One drink equals 12 ounces of beer, 5 ounces of wine, or 1 ounces of hard liquor.  Do not use street drugs.  Do not share needles.  Ask your health care provider for help if you need support or information about quitting drugs.  Tell your health care provider if you often feel depressed.  Tell your health care provider if you have ever been abused or do not feel safe at home.   This information is not intended to replace advice given to you by your health care provider. Make sure you discuss any questions you have with your health care provider.   Document Released: 02/20/2011 Document Revised: 08/28/2014 Document Reviewed: 07/09/2013 Elsevier Interactive Patient Education [employer].

## 2016-01-19 NOTE — Assessment & Plan Note (Addendum)
Check today; weight loss and healthy eating encouraged

## 2016-01-19 NOTE — Assessment & Plan Note (Addendum)
Check labs at patient's request

## 2016-01-19 NOTE — Progress Notes (Signed)
Patient ID: Amber Stanley, female   DOB: 1971/03/18, 45 y.o.   MRN: [id number]   Subjective:   Amber Stanley is a 45 y.o. female here for a complete physical exam  Interim issues since last visit: none  USPSTF grade A and B recommendations Alcohol: no Depression: Depression screen [facility] 2/9 01/19/2016  Decreased Interest 0  Down, Depressed, Hopeless 0  PHQ - 2 Score 0   Hypertension: well-controlled Obesity: combination of things; not sure about thyroid; tsh normal but not getting t3 and t4 checked; gets bored easily with weight loss programs Tobacco use: quit 10 years HIV, hep B, hep C: politely declined STD testing and prevention (chl/gon/syphilis): through GYN, she's good Lipids: today Glucose: today Colorectal cancer: no 1st degree with colon cancer; one cousin younger has had colon cancer; no one else in the family, and cousin's father died of colon cancer, not related to patient Breast cancer: our office; no lumps BRCA gene screening: no fam hx of ovarian or breast cancer Intimate partner violence: no Cervical cancer screening: through GYN Lung cancer: n/a Osteoporosis: n/a Fall prevention/vitamin D: good fall precautions; not taking vit D AAA: n/a Aspirin: n/a Diet: good diet Exercise: nothing regular, but does walk some; sitting or standing at work Skin cancer: no worrisome moles  Past Medical History  Diagnosis Date  . Diabetes mellitus without complication ([place])   . Asthma   . Reflux   . Hypertension   . Thyroid disease    Past Surgical History  Procedure Laterality Date  . Cesarean section    . Cholecystectomy    . Bilateral carpal tunnel release    . Sesmoidectomy      removed from foot  . Dilation and curettage of uterus     Family History  Problem Relation Age of Onset  . Diabetes Mother   . Hypertension Mother   . Heart disease Sister   . Cancer Brother     leukemia  . Stroke Maternal Grandmother   . Diabetes Paternal Grandmother   .  Hypertension Paternal Grandmother   . Stroke Paternal Grandmother   . COPD Neg Hx    Social History  Substance Use Topics  . Smoking status: Former Smoker -- 1.50 packs/day for 20 years    Types: Cigarettes    Quit date: 09/10/2005  . Smokeless tobacco: Never Used     Comment: quit 7 years ago   . Alcohol Use: No   Review of Systems  Constitutional: Negative for fever and chills.  HENT: Negative for hearing loss.   Eyes: Negative for visual disturbance.  Respiratory: Negative for shortness of breath and wheezing.   Cardiovascular: Negative for chest pain and leg swelling.  Gastrointestinal: Negative for blood in stool.  Endocrine: Positive for polydipsia (with sugars) and polyuria.  Genitourinary: Negative for hematuria.  Allergic/Immunologic: Negative for food allergies.  Hematological: Does not bruise/bleed easily.   Objective:   Filed Vitals:   01/19/16 0820  BP: 118/68  Pulse: 83  Temp: 98.2 F (36.8 C)  Resp: 20  Height: 5' 8"  (1.727 m)  Weight: 267 lb 1 oz (121.139 kg)  SpO2: 97%   Body mass index is 40.62 kg/(m^2). Wt Readings from Last 3 Encounters:  01/19/16 267 lb 1 oz (121.139 kg)  08/26/15 253 lb (114.76 kg)  07/25/13 265 lb (120.203 kg)   Physical Exam  Constitutional: She appears well-developed and well-nourished.  Morbidly obese  HENT:  Head: Normocephalic and atraumatic.  Right Ear: Hearing, tympanic  membrane, external ear and ear canal normal.  Left Ear: Hearing, tympanic membrane, external ear and ear canal normal.  Eyes: Conjunctivae and EOM are normal. Right eye exhibits no hordeolum. Left eye exhibits no hordeolum. No scleral icterus.  Neck: Carotid bruit is not present. No thyromegaly present.  Cardiovascular: Normal rate, regular rhythm, S1 normal, S2 normal and normal heart sounds.   No extrasystoles are present.  Pulmonary/Chest: Effort normal and breath sounds normal. No respiratory distress.  Abdominal: Soft. Normal appearance and  bowel sounds are normal. She exhibits no distension, no abdominal bruit, no pulsatile midline mass and no mass. There is no hepatosplenomegaly. There is no tenderness. No hernia.  Musculoskeletal: Normal range of motion. She exhibits no edema.  Lymphadenopathy:       Head (right side): No submandibular adenopathy present.       Head (left side): No submandibular adenopathy present.    She has no cervical adenopathy.    She has no axillary adenopathy.  Neurological: She is alert. She displays no tremor. No cranial nerve deficit. She exhibits normal muscle tone. Gait normal.  Reflex Scores:      Patellar reflexes are 2+ on the right side and 2+ on the left side. Skin: Skin is warm and dry. No bruising and no ecchymosis noted. No cyanosis. No pallor.  Psychiatric: Her speech is normal and behavior is normal. Thought content normal. Her mood appears not anxious. She does not exhibit a depressed mood.   Diabetic Foot Form - Detailed   Diabetic Foot Exam - detailed  Diabetic Foot exam was performed with the following findings:  Yes 01/19/2016  8:33 AM  Visual Foot Exam completed.:  Yes  Are the toenails long?:  No  Are the toenails thick?:  No  Are the toenails ingrown?:  No  Normal Range of Motion:  Yes    Pulse Foot Exam completed.:  Yes  Right Dorsalis Pedis:  Present Left Dorsalis Pedis:  Present  Sensory Foot Exam Completed.:  Yes  Swelling:  No  Semmes-Weinstein Monofilament Test  R Site 1-Great Toe:  Pos L Site 1-Great Toe:  Pos  R Site 4:  Pos L Site 4:  Pos  R Site 5:  Pos L Site 5:  Pos       Assessment/Plan:   Problem List Items Addressed This Visit      Endocrine   Adult hypothyroidism    Check labs at patient's request      Relevant Orders   T4, free (Completed)   TSH (Completed)   T3, free (Completed)   Type 2 diabetes mellitus ([place])    Managed by endo; foot exam done today        Other   Fatigue   Relevant Orders   VITAMIN D 25 Hydroxy (Vit-D Deficiency,  Fractures) (Completed)   Vitamin B12 (Completed)   CBC with Differential/Platelet (Completed)   Hypercholesterolemia    Check today; weight loss and healthy eating encouraged      Relevant Orders   Lipid Panel w/o Chol/HDL Ratio (Completed)   Morbid obesity ([place])    See AVS; encouragement given to lose weight      Preventative health care - Primary    USPSTF grade A and B recommendations reviewed with patient; age-appropriate recommendations, preventive care, screening tests, etc discussed and encouraged; healthy living encouraged; see AVS for patient education given to patient      Relevant Orders   Comprehensive metabolic panel (Completed)   Lipid Panel w/o  Chol/HDL Ratio (Completed)    Other Visit Diagnoses    Breast cancer screening        mammogram ordered; other gyn care through gyn office    Relevant Orders    MM DIGITAL SCREENING BILATERAL        Meds ordered this encounter  Medications  . Flaxseed Oil OIL    Sig:    Orders Placed This Encounter  Procedures  . MM DIGITAL SCREENING BILATERAL    Order Specific Question:  Reason for Exam (SYMPTOM  OR DIAGNOSIS REQUIRED)    Answer:  screening    Order Specific Question:  Is the patient pregnant?    Answer:  No    Order Specific Question:  Preferred imaging location?    Answer:  [facility]  . T4, free  . TSH  . T3, free  . Comprehensive metabolic panel    Order Specific Question:  Has the patient fasted?    Answer:  Yes  . Lipid Panel w/o Chol/HDL Ratio    Order Specific Question:  Has the patient fasted?    Answer:  Yes  . VITAMIN D 25 Hydroxy (Vit-D Deficiency, Fractures)  . Vitamin B12  . CBC with Differential/Platelet    Follow up plan: Return in about 1 year (around 01/18/2017) for complete physical. An after-visit summary was printed and given to the patient at [place].  Please see the patient instructions which may contain other information and recommendations beyond what is mentioned above  in the assessment and plan.

## 2016-01-20 LAB — COMPREHENSIVE METABOLIC PANEL
ALT: 26 IU/L (ref 0–32)
AST: 20 IU/L (ref 0–40)
Albumin/Globulin Ratio: 1.6 (ref 1.2–2.2)
Albumin: 4.1 g/dL (ref 3.5–5.5)
Alkaline Phosphatase: 57 IU/L (ref 39–117)
BUN/Creatinine Ratio: 10 (ref 9–23)
BUN: 6 mg/dL (ref 6–24)
Bilirubin Total: 0.2 mg/dL (ref 0.0–1.2)
CALCIUM: 9.1 mg/dL (ref 8.7–10.2)
CHLORIDE: 99 mmol/L (ref 96–106)
CO2: 23 mmol/L (ref 18–29)
CREATININE: 0.61 mg/dL (ref 0.57–1.00)
GFR, EST AFRICAN AMERICAN: 128 mL/min/{1.73_m2} (ref >59)
GFR, EST NON AFRICAN AMERICAN: 111 mL/min/{1.73_m2} (ref >59)
GLUCOSE: 158 mg/dL — AB (ref 65–99)
Globulin, Total: 2.6 g/dL (ref 1.5–4.5)
Potassium: 4.6 mmol/L (ref 3.5–5.2)
Sodium: 138 mmol/L (ref 134–144)
TOTAL PROTEIN: 6.7 g/dL (ref 6.0–8.5)

## 2016-01-20 LAB — CBC WITH DIFFERENTIAL/PLATELET
BASOS ABS: 0.1 10*3/uL (ref 0.0–0.2)
Basos: 1 %
EOS (ABSOLUTE): 0.3 10*3/uL (ref 0.0–0.4)
Eos: 5 %
HEMOGLOBIN: 11.9 g/dL (ref 11.1–15.9)
Hematocrit: 36.1 % (ref 34.0–46.6)
Immature Grans (Abs): 0 10*3/uL (ref 0.0–0.1)
Immature Granulocytes: 0 %
LYMPHS ABS: 2.4 10*3/uL (ref 0.7–3.1)
Lymphs: 35 %
MCH: 27.5 pg (ref 26.6–33.0)
MCHC: 33 g/dL (ref 31.5–35.7)
MCV: 84 fL (ref 79–97)
MONOCYTES: 5 %
Monocytes Absolute: 0.3 10*3/uL (ref 0.1–0.9)
NEUTROS ABS: 3.6 10*3/uL (ref 1.4–7.0)
Neutrophils: 54 %
Platelets: 271 10*3/uL (ref 150–379)
RBC: 4.32 x10E6/uL (ref 3.77–5.28)
RDW: 14.2 % (ref 12.3–15.4)
WBC: 6.7 10*3/uL (ref 3.4–10.8)

## 2016-01-20 LAB — LIPID PANEL W/O CHOL/HDL RATIO
Cholesterol, Total: 161 mg/dL (ref 100–199)
HDL: 28 mg/dL — AB (ref >39)
LDL CALC: 96 mg/dL (ref 0–99)
Triglycerides: 183 mg/dL — ABNORMAL HIGH (ref 0–149)
VLDL CHOLESTEROL CAL: 37 mg/dL (ref 5–40)

## 2016-01-20 LAB — VITAMIN D 25 HYDROXY (VIT D DEFICIENCY, FRACTURES): VIT D 25 HYDROXY: 24.4 ng/mL — AB (ref 30.0–100.0)

## 2016-01-20 LAB — T4, FREE: Free T4: 1.2 ng/dL (ref 0.82–1.77)

## 2016-01-20 LAB — TSH: TSH: 1.53 u[IU]/mL (ref 0.450–4.500)

## 2016-01-20 LAB — T3, FREE: T3 FREE: 2.4 pg/mL (ref 2.0–4.4)

## 2016-01-20 LAB — VITAMIN B12: VITAMIN B 12: 405 pg/mL (ref 211–946)

## 2016-01-20 NOTE — Assessment & Plan Note (Signed)
USPSTF grade A and B recommendations reviewed with patient; age-appropriate recommendations, preventive care, screening tests, etc discussed and encouraged; healthy living encouraged; see AVS for patient education given to patient  

## 2016-01-20 NOTE — Assessment & Plan Note (Signed)
Managed by endo; foot exam done today

## 2016-01-20 NOTE — Assessment & Plan Note (Signed)
See AVS; encouragement given to lose weight

## 2016-02-07 ENCOUNTER — Ambulatory Visit
Admission: RE | Admit: 2016-02-07 | Discharge: 2016-02-07 | Disposition: A | Discharge status: Home / Self Care | Payer: 59 | Source: Ambulatory Visit | Attending: Family Medicine | Admitting: Family Medicine

## 2016-02-07 DIAGNOSIS — Z1231 Encounter for screening mammogram for malignant neoplasm of breast: Secondary | ICD-10-CM | POA: Diagnosis present

## 2016-02-23 LAB — HM PAP SMEAR: HM Pap smear: NEGATIVE

## 2016-03-10 DIAGNOSIS — Z6841 Body Mass Index (BMI) 40.0 and over, adult: Secondary | ICD-10-CM

## 2016-04-17 DIAGNOSIS — S62101A Fracture of unspecified carpal bone, right wrist, initial encounter for closed fracture: Secondary | ICD-10-CM | POA: Insufficient documentation

## 2016-05-15 ENCOUNTER — Encounter: Payer: Self-pay | Admitting: Family Medicine

## 2016-05-15 ENCOUNTER — Ambulatory Visit (INDEPENDENT_AMBULATORY_CARE_PROVIDER_SITE_OTHER): Payer: 59 | Admitting: Family Medicine

## 2016-05-15 DIAGNOSIS — Z23 Encounter for immunization: Secondary | ICD-10-CM

## 2016-05-15 NOTE — Progress Notes (Signed)
BP 140/86   Pulse 94   Temp 98.5 F (36.9 C) (Oral)   Resp 14   Ht 5\' 8"  (1.727 m)   Wt 252 lb 9.6 oz (114.6 kg)   LMP 05/14/2016   SpO2 98%   BMI 38.41 kg/m    Subjective:    Patient ID: Amber Stanley, female    DOB: 1971/06/17, 45 y.o.   MRN: [id number]  HPI: Amber Stanley is a 45 y.o. female  Chief Complaint  Patient presents with  . paperwork   Here for paperwork Her job requires weight loss; her BMI is higher than their target so she is here to discuss this She has lost 15 pounds in just under 4 months; has been trying to lose weight, just couldn't reach their cut-off in time; she cut out her carbs She drinks diet drinks; drinks coffee, half and half creamer, sugar free pumpkin spice creamer Not eating late at night Does skip breakfast She lives with someone who doesn't need to lose weight Her mother cooks low carb Patient loves cheese She has an ellipitcal at the house and walks at the house With cutting out things, that doesn't last; she did that because her A1c was too high; Dr. Gabriel Carina is working on that with her; she is doing Victoza too; her sugar dropped; she went on vacation and it messed her up and she gained back She is left-handed and broke her left wrist, sprained her right wrist and bruised tailbone; out of cast just today, now wearing brace  Depression screen [facility] 2/9 05/15/2016 01/19/2016  Decreased Interest 0 0  Down, Depressed, Hopeless 0 0  PHQ - 2 Score 0 0   Relevant past medical, surgical, family and social history reviewed Past Medical History:  Diagnosis Date  . Asthma   . Diabetes mellitus without complication ([place])   . Hypercholesterolemia 07/03/2011  . Hypertension   . Morbid obesity ([place]) 08/26/2015  . Reflux   . Thyroid disease    Past Surgical History:  Procedure Laterality Date  . BILATERAL CARPAL TUNNEL RELEASE    . CESAREAN SECTION    . CHOLECYSTECTOMY    . DILATION AND CURETTAGE OF UTERUS    . SESMOIDECTOMY     removed  from foot   Social History  Substance Use Topics  . Smoking status: Former Smoker    Packs/day: 1.50    Years: 20.00    Types: Cigarettes    Quit date: 09/10/2005  . Smokeless tobacco: Never Used     Comment: quit 7 years ago   . Alcohol use No   Interim medical history since last visit reviewed. Allergies and medications reviewed  Review of Systems Per HPI unless specifically indicated above     Objective:    BP 140/86   Pulse 94   Temp 98.5 F (36.9 C) (Oral)   Resp 14   Ht 5\' 8"  (1.727 m)   Wt 252 lb 9.6 oz (114.6 kg)   LMP 05/14/2016   SpO2 98%   BMI 38.41 kg/m   Wt Readings from Last 3 Encounters:  05/15/16 252 lb 9.6 oz (114.6 kg)  01/19/16 267 lb 1 oz (121.1 kg)  08/26/15 253 lb (114.8 kg)    Physical Exam  Constitutional: She appears well-developed and well-nourished. No distress.  Eyes: EOM are normal. No scleral icterus.  Neck: No thyromegaly present.  Cardiovascular: Normal rate and regular rhythm.   Pulmonary/Chest: Effort normal and breath sounds normal.  Abdominal: She  exhibits no distension.  Musculoskeletal:       Left wrist: She exhibits decreased range of motion. She exhibits no swelling and no deformity.  Skin: No pallor.  Psychiatric: She has a normal mood and affect. Her behavior is normal. Judgment and thought content normal.   Lab Results  Component Value Date   TSH 1.530 01/19/2016      Assessment & Plan:   Problem List Items Addressed This Visit      Other   Morbid obesity ([place])    Talked with patient about weight loss, target BMI; hurdles she faces with losing weight, support offered; troubleshooting discussed; see AVS for guidance; she will try to lose 1 pound per week; see form completed for work      Relevant Medications   Liraglutide (VICTOZA) 18 MG/3ML SOPN   HUMALOG 100 UNIT/ML injection    Other Visit Diagnoses    Needs flu shot    -  Primary   Relevant Orders   Flu Vaccine QUAD 36+ mos PF IM (Fluarix & Fluzone  Quad PF) (Completed)      Follow up plan: Return in about 12 weeks (around 08/07/2016) for follow-up.  An after-visit summary was printed and given to the patient at [place].  Please see the patient instructions which may contain other information and recommendations beyond what is mentioned above in the assessment and plan.  Meds ordered this encounter  Medications  . omeprazole (PRILOSEC) 20 MG capsule    Sig: Take 20 mg by mouth daily.  . Liraglutide (VICTOZA) 18 MG/3ML SOPN    Sig: Inject 0.6 mg into the skin daily. 1.8 units  . HUMALOG 100 UNIT/ML injection    Sig: 100 Units. pump  . Cholecalciferol (VITAMIN D3) 5000 UNIT/ML LIQD    Sig: Take 5,000 mg by mouth. 2x week  . cyanocobalamin (V-R VITAMIN B-12) 500 MCG tablet    Sig: Take 1,000 mg by mouth daily.    Orders Placed This Encounter  Procedures  . Flu Vaccine QUAD 36+ mos PF IM (Fluarix & Fluzone Quad PF)   Face-to-face time with patient was more than 15 minutes, >50% time spent counseling and coordination of care

## 2016-05-15 NOTE — Patient Instructions (Signed)
Check out the information at familydoctor.org entitled "Nutrition for Weight Loss: What You Need to Know about Fad Diets" Try to lose between 1-2 pounds per week by taking in fewer calories and burning off more calories You can succeed by limiting portions, limiting foods dense in calories and fat, becoming more active, and drinking 8 glasses of water a day (64 ounces) Don't skip meals, especially breakfast, as skipping meals may alter your metabolism Do not use over-the-counter weight loss pills or gimmicks that claim rapid weight loss A healthy BMI (or body mass index) is between 18.5 and 24.9 You can calculate your ideal BMI at the NIH website http://www.nhlbi.nih.gov/health/educational/lose_wt/BMI/bmicalc.htm  

## 2016-05-17 ENCOUNTER — Encounter: Payer: Self-pay | Admitting: Family Medicine

## 2016-05-17 NOTE — Assessment & Plan Note (Signed)
Talked with patient about weight loss, target BMI; hurdles she faces with losing weight, support offered; troubleshooting discussed; see AVS for guidance; she will try to lose 1 pound per week; see form completed for work

## 2016-05-24 ENCOUNTER — Other Ambulatory Visit: Payer: Self-pay | Admitting: Family Medicine

## 2016-06-06 ENCOUNTER — Other Ambulatory Visit: Payer: Self-pay | Admitting: Family Medicine

## 2016-06-06 NOTE — Telephone Encounter (Signed)
Rx sent 

## 2016-06-06 NOTE — Telephone Encounter (Signed)
RX requests for a patient of yours!

## 2016-08-10 ENCOUNTER — Encounter: Payer: Self-pay | Admitting: Family Medicine

## 2016-08-10 ENCOUNTER — Ambulatory Visit (INDEPENDENT_AMBULATORY_CARE_PROVIDER_SITE_OTHER): Payer: 59 | Admitting: Family Medicine

## 2016-08-10 DIAGNOSIS — E7800 Pure hypercholesterolemia, unspecified: Secondary | ICD-10-CM

## 2016-08-10 DIAGNOSIS — Z23 Encounter for immunization: Secondary | ICD-10-CM

## 2016-08-10 DIAGNOSIS — Z6837 Body mass index (BMI) 37.0-37.9, adult: Secondary | ICD-10-CM

## 2016-08-10 DIAGNOSIS — Z862 Personal history of diseases of the blood and blood-forming organs and certain disorders involving the immune mechanism: Secondary | ICD-10-CM | POA: Diagnosis not present

## 2016-08-10 DIAGNOSIS — E039 Hypothyroidism, unspecified: Secondary | ICD-10-CM | POA: Diagnosis not present

## 2016-08-10 DIAGNOSIS — E119 Type 2 diabetes mellitus without complications: Secondary | ICD-10-CM

## 2016-08-10 DIAGNOSIS — Z6830 Body mass index (BMI) 30.0-30.9, adult: Secondary | ICD-10-CM | POA: Insufficient documentation

## 2016-08-10 DIAGNOSIS — E6609 Other obesity due to excess calories: Secondary | ICD-10-CM

## 2016-08-10 DIAGNOSIS — E669 Obesity, unspecified: Secondary | ICD-10-CM | POA: Insufficient documentation

## 2016-08-10 DIAGNOSIS — IMO0001 Reserved for inherently not codable concepts without codable children: Secondary | ICD-10-CM

## 2016-08-10 NOTE — Progress Notes (Signed)
BP 122/68   Pulse 93   Temp 97.9 F (36.6 C)   Resp 16   Wt 249 lb (112.9 kg)   LMP 08/06/2016   SpO2 96%   BMI 37.86 kg/m    Subjective:    Patient ID: Amber Stanley, female    DOB: 1971/08/05, 45 y.o.   MRN: [id number]  HPI: Amber Stanley is a 45 y.o. female  Chief Complaint  Patient presents with  . Follow-up    12 weeks    Patient has been working on weight loss See last note for more documentation, what she has been trying She sees Dr. 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 Asthma   . Diabetes mellitus without complication ([place])   . Hypercholesterolemia 07/03/2011  . Hypertension   . Morbid obesity ([place]) 08/26/2015  . Obesity 08/10/2016  . Reflux   . Thyroid disease    Past Surgical History:  Procedure Laterality Date  . BILATERAL CARPAL TUNNEL RELEASE    . CESAREAN SECTION    . CHOLECYSTECTOMY    . DILATION AND CURETTAGE OF UTERUS    . SESMOIDECTOMY     removed from foot   Family History  Problem Relation Age of Onset  . Diabetes Mother   . Hypertension Mother   . Heart disease Sister   . Cancer Brother     leukemia  . Stroke Maternal Grandmother   . Diabetes Paternal Grandmother   . Hypertension Paternal  Grandmother   . Stroke Paternal Grandmother   . COPD Neg Hx    Mother: MI at age 93, maternal uncle stroke at 84, maternal uncle kidney failure at age 67  Social History  Substance Use Topics  . Smoking status: Former Smoker    Packs/day: 1.50    Years: 20.00    Types: Cigarettes    Quit date: 09/10/2005  . Smokeless tobacco: Never Used     Comment: quit 7 years ago   . Alcohol use No   Interim medical history since last visit reviewed. Allergies and medications reviewed  Review of Systems Per HPI unless specifically indicated above     Objective:    BP 122/68   Pulse 93   Temp 97.9 F (36.6 C)   Resp 16   Wt 249 lb (112.9 kg)   LMP 08/06/2016   SpO2 96%   BMI 37.86 kg/m   Wt Readings from Last 3 Encounters:  08/10/16 249 lb (112.9 kg)  05/15/16 252 lb 9.6 oz (114.6 kg)  01/19/16 267 lb 1 oz (121.1 kg)    Physical Exam  Constitutional: She appears well-developed and well-nourished. No distress.  Eyes: EOM are normal. No scleral  icterus.  Neck: No thyromegaly present.  Cardiovascular: Normal rate and regular rhythm.   Pulmonary/Chest: Effort normal and breath sounds normal.  Abdominal: She exhibits no distension.  Skin: No pallor.  Psychiatric: She has a normal mood and affect. Her behavior is normal. Judgment and thought content normal. Her mood appears not anxious.   Diabetic Foot Form - Detailed   Diabetic Foot Exam - detailed Diabetic Foot exam was performed with the following findings:  Yes 08/10/2016  9:48 PM  Visual Foot Exam completed.:  Yes  Are the toenails ingrown?:  No Normal Range of Motion:  Yes Pulse Foot Exam completed.:  Yes  Right Dorsalis Pedis:  Present Left Dorsalis Pedis:  Present  Sensory Foot Exam Completed.:  Yes Semmes-Weinstein Monofilament Test R Site 1-Great Toe:  Pos L Site 1-Great Toe:  Pos  R Site 4:  Pos L Site 4:  Pos  R Site 5:  Pos L Site 5:  Pos           Assessment & Plan:   Problem List Items Addressed This  Visit      Endocrine   Type 2 diabetes mellitus ([place])    Monitored by endo      Adult hypothyroidism    Monitored by endo        Other   Obesity    Encouragement given      Need for pneumococcal vaccination    PCV-13 offered and given today; PPSV-23 UTD      Relevant Orders   Pneumococcal conjugate vaccine 13-valent IM (Completed)   Hypercholesterolemia    Goal under 100 at least and but under 44 will be ideal given family history and diagnosis of diabetes; limit saturated fatas      Hx of microcytic hypochromic anemia    Check CBC and ferritin      Relevant Orders   CBC with Differential/Platelet (Completed)   Ferritin (Completed)   Iron Binding Cap (TIBC) (Completed)   Iron and TIBC      Follow up plan: Return in about 3 months (around 11/08/2016) for monitoring.  An after-visit summary was printed and given to the patient at [place].  Please see the patient instructions which may contain other information and recommendations beyond what is mentioned above in the assessment and plan.  No orders of the defined types were placed in this encounter.   Orders Placed This Encounter  Procedures  . Pneumococcal conjugate vaccine 13-valent IM  . CBC with Differential/Platelet  . Ferritin  . Iron Binding Cap (TIBC)  . Iron and TIBC

## 2016-08-10 NOTE — Assessment & Plan Note (Signed)
PCV-13 offered and given today; PPSV-23 UTD

## 2016-08-10 NOTE — Patient Instructions (Addendum)
Try to limit saturated fats in your diet (bologna, hot dogs, barbeque, cheeseburgers, hamburgers, steak, bacon, sausage, cheese, etc.) and get more fresh fruits, vegetables, and whole grains Fat-free or reduced fat dressing or little bit of olive oil Keep up the great effort at exercise Hydration is important We'll get labs today If you have not heard anything from my staff in a week about any orders/referrals/studies from today, please contact us here to follow-up [phone] Return in 3 months

## 2016-08-10 NOTE — Assessment & Plan Note (Signed)
Check CBC and ferritin 

## 2016-08-10 NOTE — Assessment & Plan Note (Signed)
Monitored by endo 

## 2016-08-10 NOTE — Assessment & Plan Note (Signed)
Goal under 100 at least and but under 70 will be ideal given family history and diagnosis of diabetes; limit saturated fatas

## 2016-08-10 NOTE — Assessment & Plan Note (Signed)
Encouragement given 

## 2016-08-11 LAB — CBC WITH DIFFERENTIAL/PLATELET
BASOS: 1 %
Basophils Absolute: 0 10*3/uL (ref 0.0–0.2)
EOS (ABSOLUTE): 0.2 10*3/uL (ref 0.0–0.4)
EOS: 3 %
HEMATOCRIT: 33.6 % — AB (ref 34.0–46.6)
HEMOGLOBIN: 11 g/dL — AB (ref 11.1–15.9)
IMMATURE GRANS (ABS): 0 10*3/uL (ref 0.0–0.1)
IMMATURE GRANULOCYTES: 0 %
LYMPHS: 41 %
Lymphocytes Absolute: 3.2 10*3/uL — ABNORMAL HIGH (ref 0.7–3.1)
MCH: 24.8 pg — ABNORMAL LOW (ref 26.6–33.0)
MCHC: 32.7 g/dL (ref 31.5–35.7)
MCV: 76 fL — AB (ref 79–97)
MONOCYTES: 7 %
MONOS ABS: 0.5 10*3/uL (ref 0.1–0.9)
NEUTROS PCT: 48 %
Neutrophils Absolute: 3.7 10*3/uL (ref 1.4–7.0)
Platelets: 340 10*3/uL (ref 150–379)
RBC: 4.43 x10E6/uL (ref 3.77–5.28)
RDW: 16.3 % — ABNORMAL HIGH (ref 12.3–15.4)
WBC: 7.7 10*3/uL (ref 3.4–10.8)

## 2016-08-11 LAB — FERRITIN: FERRITIN: 7 ng/mL — AB (ref 15–150)

## 2016-08-11 LAB — IRON AND TIBC
IRON: 27 ug/dL (ref 27–159)
Iron Saturation: 7 % — CL (ref 15–55)
TIBC: 384 ug/dL (ref 250–450)
UIBC: 357 ug/dL (ref 131–425)

## 2016-08-15 ENCOUNTER — Telehealth: Payer: Self-pay | Admitting: Family Medicine

## 2016-08-15 DIAGNOSIS — K219 Gastro-esophageal reflux disease without esophagitis: Secondary | ICD-10-CM

## 2016-08-15 DIAGNOSIS — D509 Iron deficiency anemia, unspecified: Secondary | ICD-10-CM | POA: Insufficient documentation

## 2016-08-15 DIAGNOSIS — D508 Other iron deficiency anemias: Secondary | ICD-10-CM

## 2016-08-15 MED ORDER — POLYSACCHARIDE IRON COMPLEX 150 MG PO CAPS: 150.0000 mg | ORAL_CAPSULE | Freq: Every day | ORAL | 1 refills | Status: DC

## 2016-08-15 NOTE — Assessment & Plan Note (Signed)
Refer for colonoscopy 

## 2016-08-15 NOTE — Telephone Encounter (Signed)
I talked with patient about low iron She is taking omeprazole She can go for a few days without taking Skip PPI Wed and Sundays Start iron pills; every other day at first and then to every day if tolerated Refer for colonoscopy (45 yo obese, iron deficiency anemia)

## 2016-08-15 NOTE — Assessment & Plan Note (Signed)
Chronic PPI therapy; may not be absorbing iron; refer to GI

## 2016-08-21 DIAGNOSIS — K5790 Diverticulosis of intestine, part unspecified, without perforation or abscess without bleeding: Secondary | ICD-10-CM

## 2016-08-21 HISTORY — DX: Diverticulosis of intestine, part unspecified, without perforation or abscess without bleeding: K57.90

## 2016-08-25 ENCOUNTER — Other Ambulatory Visit: Payer: Self-pay | Admitting: *Deleted

## 2016-08-29 ENCOUNTER — Other Ambulatory Visit: Payer: Self-pay | Admitting: *Deleted

## 2016-08-29 ENCOUNTER — Ambulatory Visit (INDEPENDENT_AMBULATORY_CARE_PROVIDER_SITE_OTHER): Payer: 59 | Admitting: Gastroenterology

## 2016-08-29 ENCOUNTER — Encounter: Payer: Self-pay | Admitting: Gastroenterology

## 2016-08-29 VITALS — BP 118/72 | HR 84 | Temp 97.4°F | Wt 251.0 lb

## 2016-08-29 DIAGNOSIS — D508 Other iron deficiency anemias: Secondary | ICD-10-CM | POA: Diagnosis not present

## 2016-08-29 NOTE — Progress Notes (Signed)
Gastroenterology Consultation  Referring Provider:     Arnetha Courser, MD Primary Care Physician:  Enid Derry, MD Primary Gastroenterologist:  Dr. Jonathon Bellows  Reason for Consultation:     Iron deficiency anemia         HPI:   Amber Stanley is a 46 y.o. y/o female referred for consultation & management  by Dr. Enid Derry, MD.     She has been referred for iron deficiency anemia. Labs 07/2016 Iron %saturation 7, ferritin 7. Hb 11, MCV 76  She was diagnosed in 07/2016 with anemia. She says that last diagnosed with anemia as a teenager. No changes in diet . Intentionally lost 18 lbs . No cancers in her family except leukemia in her brother. When she has wiped after a bowel movement , she has seen a few drops of blood , on a single occasion.   She has her periods, one every month, lasts 4 days, changes 3-4 pads not heavily soaked. No hematemesis, no blood in her urine, no nasal bleeds, no celiac disease in family. She is on oral iron presently . Does not each much meat .    Past Medical History:  Diagnosis Date  . Asthma   . Diabetes mellitus without complication ([place])   . Hypercholesterolemia 07/03/2011  . Hypertension   . Morbid obesity ([place]) 08/26/2015  . Obesity 08/10/2016  . Reflux   . Thyroid disease     Past Surgical History:  Procedure Laterality Date  . BILATERAL CARPAL TUNNEL RELEASE    . CESAREAN SECTION    . CHOLECYSTECTOMY    . DILATION AND CURETTAGE OF UTERUS    . SESMOIDECTOMY     removed from foot    Prior to Admission medications   Medication Sig Start Date End Date Taking? Authorizing Provider  ADVAIR DISKUS 100-50 MCG/DOSE AEPB Use 1 puff by mouth twice  daily 11/21/15  Yes Guadalupe Maple, MD  albuterol (PROAIR HFA) 108 (90 Base) MCG/ACT inhaler Inhale 2 puffs into the lungs every 4 (four) hours as needed for wheezing or shortness of breath. 08/26/15  Yes Arnetha Courser, MD  APPLE CIDER VINEGAR PO Take by mouth.   Yes Historical Provider, MD    Cholecalciferol (VITAMIN D3) 5000 UNIT/ML LIQD Take 5,000 mg by mouth. 2x week   Yes Historical Provider, MD  cyanocobalamin (V-R VITAMIN B-12) 500 MCG tablet Take 1,000 mg by mouth daily.   Yes Historical Provider, MD  HUMALOG 100 UNIT/ML injection 100 Units. pump 04/28/16  Yes Historical Provider, MD  Insulin Infusion Pump Supplies (PARADIGM RESERVOIR 3ML) MISC  06/04/13  Yes Historical Provider, MD  iron polysaccharides (NU-IRON) 150 MG capsule Take 1 capsule (150 mg total) by mouth daily. 08/15/16  Yes Arnetha Courser, MD  levothyroxine (SYNTHROID, LEVOTHROID) 75 MCG tablet Take 75 mcg by mouth daily before breakfast.  06/04/13  Yes Historical Provider, MD  Liraglutide (VICTOZA) 18 MG/3ML SOPN Inject 0.6 mg into the skin daily. 1.8 units 03/10/16 03/10/17 Yes Historical Provider, MD  metFORMIN (GLUCOPHAGE) 1000 MG tablet Take 1,000 mg by mouth 2 (two) times daily with a meal.  05/02/13  Yes Historical Provider, MD  metoprolol succinate (TOPROL-XL) 50 MG 24 hr tablet TAKE 1 TABLET BY MOUTH  DAILY 06/06/16  Yes Arnetha Courser, MD  montelukast (SINGULAIR) 10 MG tablet TAKE 1 TABLET BY MOUTH  DAILY 06/06/16  Yes Arnetha Courser, MD  omeprazole (PRILOSEC) 20 MG capsule Take 20 mg by mouth daily.  Yes Historical Provider, MD  ONE TOUCH ULTRA TEST test strip  07/08/13  Yes Historical Provider, MD    Family History  Problem Relation Age of Onset  . Diabetes Mother   . Hypertension Mother   . Heart disease Sister   . Cancer Brother     leukemia  . Stroke Maternal Grandmother   . Diabetes Paternal Grandmother   . Hypertension Paternal Grandmother   . Stroke Paternal Grandmother   . COPD Neg Hx      Social History  Substance Use Topics  . Smoking status: Former Smoker    Packs/day: 1.50    Years: 20.00    Types: Cigarettes    Quit date: 09/10/2005  . Smokeless tobacco: Never Used     Comment: quit 7 years ago   . Alcohol use No    Allergies as of 08/29/2016 - Review Complete 08/29/2016   Allergen Reaction Noted  . Adhesive [tape] Itching 07/11/2013    Review of Systems:    All systems reviewed and negative except where noted in HPI.   Physical Exam:  BP 118/72   Pulse 84   Temp 97.4 F (36.3 C)   Wt 251 lb (113.9 kg)   LMP 08/06/2016   BMI 38.16 kg/m  Patient's last menstrual period was 08/06/2016. Psych:  Alert and cooperative. Normal mood and affect. General:   Alert,  Well-developed, well-nourished, pleasant and cooperative in NAD Head:  Normocephalic and atraumatic. Eyes:  Sclera clear, no icterus.   Conjunctiva pink. Ears:  Normal auditory acuity. Nose:  No deformity, discharge, or lesions. Mouth:  No deformity or lesions,oropharynx pink & moist. Neck:  Supple; no masses or thyromegaly. Lungs:  Respirations even and unlabored.  Clear throughout to auscultation.   No wheezes, crackles, or rhonchi. No acute distress. Heart:  Regular rate and rhythm; no murmurs, clicks, rubs, or gallops. Abdomen:  Normal bowel sounds.  No bruits.  Soft, non-tender and non-distended without masses, hepatosplenomegaly or hernias noted.  No guarding or rebound tenderness.    Psych:  Alert and cooperative. Normal mood and affect.  Imaging Studies: No results found.  Assessment and Plan:   Amber Stanley is a 46 y.o. y/o female has been referred for iron deficiency anemia with no overt blood loss  Plan  1. EGD+ colonoscopy and if negative will need small bowel capsule study.   Follow up in 2 months  Dr Jonathon Bellows MD

## 2016-08-31 LAB — URINALYSIS, ROUTINE W REFLEX MICROSCOPIC
BILIRUBIN UA: NEGATIVE
Glucose, UA: NEGATIVE
KETONES UA: NEGATIVE
Leukocytes, UA: NEGATIVE
Nitrite, UA: NEGATIVE
PH UA: 6 (ref 5.0–7.5)
PROTEIN UA: NEGATIVE
RBC UA: NEGATIVE
Specific Gravity, UA: 1.011 (ref 1.005–1.030)
UUROB: 0.2 mg/dL (ref 0.2–1.0)

## 2016-08-31 LAB — FOLATE: Folate: 12 ng/mL (ref >3.0)

## 2016-08-31 LAB — CELIAC DISEASE PANEL
Endomysial IgA: NEGATIVE
IgA/Immunoglobulin A, Serum: 212 mg/dL (ref 87–352)
Transglutaminase IgA: <2 U/mL (ref 0–3)

## 2016-08-31 LAB — VITAMIN B12: Vitamin B-12: 950 pg/mL (ref 232–1245)

## 2016-09-04 ENCOUNTER — Telehealth: Payer: Self-pay

## 2016-09-04 NOTE — Telephone Encounter (Signed)
The notification/prior authorization case information was transmitted on 09/04/2016 at 10:02 AM CST. The notification/prior authorization reference number is [id number].

## 2016-09-05 ENCOUNTER — Encounter: Payer: Self-pay | Admitting: Anesthesiology

## 2016-09-05 ENCOUNTER — Ambulatory Visit
Admission: RE | Admit: 2016-09-05 | Discharge: 2016-09-05 | Disposition: A | Discharge status: Home / Self Care | Payer: 59 | Source: Ambulatory Visit | Attending: Gastroenterology | Admitting: Gastroenterology

## 2016-09-05 ENCOUNTER — Encounter
Admission: RE | Disposition: A | Discharge status: Home / Self Care | Payer: Self-pay | Source: Ambulatory Visit | Attending: Gastroenterology

## 2016-09-05 ENCOUNTER — Ambulatory Visit: Payer: 59 | Admitting: Anesthesiology

## 2016-09-05 DIAGNOSIS — Z9049 Acquired absence of other specified parts of digestive tract: Secondary | ICD-10-CM | POA: Diagnosis not present

## 2016-09-05 DIAGNOSIS — J45909 Unspecified asthma, uncomplicated: Secondary | ICD-10-CM | POA: Diagnosis not present

## 2016-09-05 DIAGNOSIS — I10 Essential (primary) hypertension: Secondary | ICD-10-CM | POA: Diagnosis not present

## 2016-09-05 DIAGNOSIS — E079 Disorder of thyroid, unspecified: Secondary | ICD-10-CM | POA: Diagnosis not present

## 2016-09-05 DIAGNOSIS — D509 Iron deficiency anemia, unspecified: Secondary | ICD-10-CM | POA: Insufficient documentation

## 2016-09-05 DIAGNOSIS — Z79899 Other long term (current) drug therapy: Secondary | ICD-10-CM | POA: Insufficient documentation

## 2016-09-05 DIAGNOSIS — Z833 Family history of diabetes mellitus: Secondary | ICD-10-CM | POA: Diagnosis not present

## 2016-09-05 DIAGNOSIS — Z91048 Other nonmedicinal substance allergy status: Secondary | ICD-10-CM | POA: Insufficient documentation

## 2016-09-05 DIAGNOSIS — K648 Other hemorrhoids: Secondary | ICD-10-CM | POA: Diagnosis not present

## 2016-09-05 DIAGNOSIS — Z794 Long term (current) use of insulin: Secondary | ICD-10-CM | POA: Insufficient documentation

## 2016-09-05 DIAGNOSIS — Z806 Family history of leukemia: Secondary | ICD-10-CM | POA: Diagnosis not present

## 2016-09-05 DIAGNOSIS — K640 First degree hemorrhoids: Secondary | ICD-10-CM | POA: Diagnosis not present

## 2016-09-05 DIAGNOSIS — Z87891 Personal history of nicotine dependence: Secondary | ICD-10-CM | POA: Diagnosis not present

## 2016-09-05 DIAGNOSIS — Z6836 Body mass index (BMI) 36.0-36.9, adult: Secondary | ICD-10-CM | POA: Diagnosis not present

## 2016-09-05 DIAGNOSIS — K5730 Diverticulosis of large intestine without perforation or abscess without bleeding: Secondary | ICD-10-CM

## 2016-09-05 DIAGNOSIS — Z823 Family history of stroke: Secondary | ICD-10-CM | POA: Diagnosis not present

## 2016-09-05 DIAGNOSIS — E7800 Pure hypercholesterolemia, unspecified: Secondary | ICD-10-CM | POA: Diagnosis not present

## 2016-09-05 DIAGNOSIS — K219 Gastro-esophageal reflux disease without esophagitis: Secondary | ICD-10-CM | POA: Insufficient documentation

## 2016-09-05 DIAGNOSIS — E119 Type 2 diabetes mellitus without complications: Secondary | ICD-10-CM | POA: Insufficient documentation

## 2016-09-05 DIAGNOSIS — Z8249 Family history of ischemic heart disease and other diseases of the circulatory system: Secondary | ICD-10-CM | POA: Diagnosis not present

## 2016-09-05 HISTORY — PX: COLONOSCOPY WITH PROPOFOL: SHX5780

## 2016-09-05 HISTORY — PX: ESOPHAGOGASTRODUODENOSCOPY (EGD) WITH PROPOFOL: SHX5813

## 2016-09-05 LAB — GLUCOSE, CAPILLARY
GLUCOSE-CAPILLARY: 86 mg/dL (ref 65–99)
GLUCOSE-CAPILLARY: 94 mg/dL (ref 65–99)

## 2016-09-05 SURGERY — ESOPHAGOGASTRODUODENOSCOPY (EGD) WITH PROPOFOL
Anesthesia: General

## 2016-09-05 MED ORDER — PHENYLEPHRINE HCL 10 MG/ML IJ SOLN
INTRAMUSCULAR | Status: DC | PRN
  Administered 2016-09-05: 100 ug via INTRAVENOUS

## 2016-09-05 MED ORDER — LACTATED RINGERS IV SOLN
INTRAVENOUS | Status: DC | PRN
  Administered 2016-09-05: 09:00:00 via INTRAVENOUS

## 2016-09-05 MED ORDER — PROPOFOL 10 MG/ML IV BOLUS
INTRAVENOUS | Status: DC | PRN
  Administered 2016-09-05: 50 mg via INTRAVENOUS
  Administered 2016-09-05: 60 mg via INTRAVENOUS
  Administered 2016-09-05: 80 mg via INTRAVENOUS

## 2016-09-05 MED ORDER — PROPOFOL 500 MG/50ML IV EMUL
INTRAVENOUS | Status: AC
  Filled 2016-09-05: qty 50

## 2016-09-05 MED ORDER — PROPOFOL 500 MG/50ML IV EMUL
INTRAVENOUS | Status: DC | PRN
  Administered 2016-09-05: 185 ug/kg/min via INTRAVENOUS

## 2016-09-05 MED ORDER — IPRATROPIUM-ALBUTEROL 0.5-2.5 (3) MG/3ML IN SOLN
3.0000 mL | Freq: Once | RESPIRATORY_TRACT | Status: AC
  Administered 2016-09-05: 3 mL via RESPIRATORY_TRACT
  Filled 2016-09-05: qty 3

## 2016-09-05 MED ORDER — SODIUM CHLORIDE 0.9 % IV SOLN
INTRAVENOUS | Status: DC
  Administered 2016-09-05: 1000 mL via INTRAVENOUS

## 2016-09-05 NOTE — Anesthesia Postprocedure Evaluation (Signed)
Anesthesia Post Note  Patient: Amber Stanley  Procedure(s) Performed: Procedure(s) (LRB): ESOPHAGOGASTRODUODENOSCOPY (EGD) WITH PROPOFOL (N/A) COLONOSCOPY WITH PROPOFOL (N/A)  Patient location during evaluation: Endoscopy Anesthesia Type: General Level of consciousness: awake and alert Pain management: pain level controlled Vital Signs Assessment: post-procedure vital signs reviewed and stable Respiratory status: spontaneous breathing, nonlabored ventilation, respiratory function stable and patient connected to nasal cannula oxygen Cardiovascular status: blood pressure returned to baseline and stable Postop Assessment: no signs of nausea or vomiting Anesthetic complications: no     Last Vitals:  Vitals:   09/05/16 0953 09/05/16 1003  BP: 108/81 117/87  Pulse: 88 87  Resp: 12 15  Temp:      Last Pain:  Vitals:   09/05/16 0933  TempSrc: Tympanic                 Precious Haws Kenly Henckel

## 2016-09-05 NOTE — Op Note (Signed)
[facility] Gastroenterology Patient Name: Amber Stanley Procedure Date: 09/05/2016 8:44 AM MRN: [id number] Account #: [id number] Date of Birth: 1971-01-17 Admit Type: Outpatient Age: 45 Room: [facility] ENDO ROOM 1 Gender: Female Note Status: Finalized Procedure:            Colonoscopy Indications:          Iron deficiency anemia Providers:            Jonathon Bellows MD, MD Referring MD:         Arnetha Courser (Referring MD) Medicines:            Monitored Anesthesia Care Complications:        No immediate complications. Procedure:            Pre-Anesthesia Assessment:                       - Prior to the procedure, a History and Physical was                        performed, and patient medications, allergies and                        sensitivities were reviewed. The patient's tolerance of                        previous anesthesia was reviewed.                       - The risks and benefits of the procedure and the                        sedation options and risks were discussed with the                        patient. All questions were answered and informed                        consent was obtained.                       - The risks and benefits of the procedure and the                        sedation options and risks were discussed with the                        patient. All questions were answered and informed                        consent was obtained.                       - ASA Grade Assessment: III - A patient with severe                        systemic disease.                       After obtaining informed consent, the colonoscope was  passed under direct vision. Throughout the procedure,                        the patient's blood pressure, pulse, and oxygen                        saturations were monitored continuously. The                        Colonoscope was introduced through the anus and                        advanced to  the the terminal ileum. The quality of the                        bowel preparation was excellent. Findings:      Two small-mouthed diverticula were found in the sigmoid colon.      Non-bleeding internal hemorrhoids were found during retroflexion. The       hemorrhoids were small and Grade I (internal hemorrhoids that do not       prolapse).      The exam was otherwise without abnormality on direct and retroflexion       views. Impression:           - Diverticulosis in the sigmoid colon.                       - Non-bleeding internal hemorrhoids.                       - The examination was otherwise normal on direct and                        retroflexion views.                       - No specimens collected. Recommendation:       - Patient has a contact number available for                        emergencies. The signs and symptoms of potential                        delayed complications were discussed with the patient.                        Return to normal activities tomorrow. Written discharge                        instructions were provided to the patient.                       - Resume previous diet.                       - Continue present medications.                       - Discharge patient to home (with escort).                       - To visualize the small bowel, perform video  capsule                        endoscopy in 3 months.                       - Return to my office as previously scheduled. Procedure Code(s):    --- Professional ---                       [phone], Colonoscopy, flexible; diagnostic, including                        collection of specimen(s) by brushing or washing, when                        performed (separate procedure) Diagnosis Code(s):    --- Professional ---                       K64.0, First degree hemorrhoids                       D50.9, Iron deficiency anemia, unspecified                       K57.30, Diverticulosis of large intestine  without                        perforation or abscess without bleeding CPT copyright 2016 American Medical Association. All rights reserved. The codes documented in this report are preliminary and upon coder review may  be revised to meet current compliance requirements. Jonathon Bellows, MD Jonathon Bellows MD, MD 09/05/2016 9:29:22 AM This report has been signed electronically. Number of Addenda: 0 Note Initiated On: 09/05/2016 8:44 AM Scope Withdrawal Time: 0 hours 13 minutes 53 seconds  Total Procedure Duration: 0 hours 18 minutes 29 seconds       [facility]

## 2016-09-05 NOTE — Transfer of Care (Signed)
Immediate Anesthesia Transfer of Care Note  Patient: Amber Stanley  Procedure(s) Performed: Procedure(s): ESOPHAGOGASTRODUODENOSCOPY (EGD) WITH PROPOFOL (N/A) COLONOSCOPY WITH PROPOFOL (N/A)  Patient Location: PACU  Anesthesia Type:General  Level of Consciousness: awake and sedated  Airway & Oxygen Therapy: Patient Spontanous Breathing and Patient connected to nasal cannula oxygen  Post-op Assessment: Report given to RN and Post -op Vital signs reviewed and stable  Post vital signs: Reviewed and stable  Last Vitals:  Vitals:   09/05/16 0826 09/05/16 0933  BP: 124/86   Pulse: 88 100  Resp: 16 13  Temp:  36.3 C    Last Pain:  Vitals:   09/05/16 0933  TempSrc: Tympanic         Complications: No apparent anesthesia complications

## 2016-09-05 NOTE — H&P (Signed)
Amber Bellows MD [address] [place], [place] [address] Phone: [phone] Fax : [phone]  Primary Care Physician:  Enid Derry, MD Primary Gastroenterologist:  Dr. Jonathon Stanley   Pre-Procedure History & Physical: HPI:  Amber Stanley is a 46 y.o. female is here for an endoscopy and colonoscopy.   Past Medical History:  Diagnosis Date  . Asthma   . Diabetes mellitus without complication ([place])   . Hypercholesterolemia 07/03/2011  . Hypertension   . Morbid obesity ([place]) 08/26/2015  . Obesity 08/10/2016  . Reflux   . Thyroid disease     Past Surgical History:  Procedure Laterality Date  . BILATERAL CARPAL TUNNEL RELEASE    . CESAREAN SECTION    . CHOLECYSTECTOMY    . DILATION AND CURETTAGE OF UTERUS    . SESMOIDECTOMY     removed from foot    Prior to Admission medications   Medication Sig Start Date End Date Taking? Authorizing Provider  ADVAIR DISKUS 100-50 MCG/DOSE AEPB Use 1 puff by mouth twice  daily 11/21/15  Yes Guadalupe Maple, MD  albuterol (PROAIR HFA) 108 (90 Base) MCG/ACT inhaler Inhale 2 puffs into the lungs every 4 (four) hours as needed for wheezing or shortness of breath. 08/26/15  Yes Arnetha Courser, MD  APPLE CIDER VINEGAR PO Take by mouth.   Yes Historical Provider, MD  Cholecalciferol (VITAMIN D3) 5000 UNIT/ML LIQD Take 5,000 mg by mouth. 2x week   Yes Historical Provider, MD  cyanocobalamin (V-R VITAMIN B-12) 500 MCG tablet Take 1,000 mg by mouth daily.   Yes Historical Provider, MD  HUMALOG 100 UNIT/ML injection 100 Units. pump 04/28/16  Yes Historical Provider, MD  Insulin Infusion Pump Supplies (PARADIGM RESERVOIR 3ML) MISC  06/04/13  Yes Historical Provider, MD  iron polysaccharides (NU-IRON) 150 MG capsule Take 1 capsule (150 mg total) by mouth daily. 08/15/16  Yes Arnetha Courser, MD  levothyroxine (SYNTHROID, LEVOTHROID) 75 MCG tablet Take 75 mcg by mouth daily before breakfast.  06/04/13  Yes Historical Provider, MD  Liraglutide (VICTOZA)  18 MG/3ML SOPN Inject 0.6 mg into the skin daily. 1.8 units 03/10/16 03/10/17 Yes Historical Provider, MD  metFORMIN (GLUCOPHAGE) 1000 MG tablet Take 1,000 mg by mouth 2 (two) times daily with a meal.  05/02/13  Yes Historical Provider, MD  metoprolol succinate (TOPROL-XL) 50 MG 24 hr tablet TAKE 1 TABLET BY MOUTH  DAILY 06/06/16  Yes Arnetha Courser, MD  montelukast (SINGULAIR) 10 MG tablet TAKE 1 TABLET BY MOUTH  DAILY 06/06/16  Yes Arnetha Courser, MD  omeprazole (PRILOSEC) 20 MG capsule Take 20 mg by mouth daily.   Yes Historical Provider, MD  ONE TOUCH ULTRA TEST test strip  07/08/13  Yes Historical Provider, MD    Allergies as of 08/29/2016 - Review Complete 08/29/2016  Allergen Reaction Noted  . Adhesive [tape] Itching 07/11/2013    Family History  Problem Relation Age of Onset  . Diabetes Mother   . Hypertension Mother   . Heart disease Sister   . Cancer Brother     leukemia  . Stroke Maternal Grandmother   . Diabetes Paternal Grandmother   . Hypertension Paternal Grandmother   . Stroke Paternal Grandmother   . COPD Neg Hx     Social History   Social History  . Marital status: Married    Spouse name: N/A  . Number of children: N/A  . Years of education: N/A   Occupational History  . Not on file.  Social History Main Topics  . Smoking status: Former Smoker    Packs/day: 1.50    Years: 20.00    Types: Cigarettes    Quit date: 09/10/2005  . Smokeless tobacco: Never Used     Comment: quit 7 years ago   . Alcohol use No  . Drug use: No  . Sexual activity: Not on file   Other Topics Concern  . Not on file   Social History Narrative  . No narrative on file    Review of Systems: See HPI, otherwise negative ROS  Physical Exam: LMP 08/06/2016  General:   Alert,  pleasant and cooperative in NAD Head:  Normocephalic and atraumatic. Neck:  Supple; no masses or thyromegaly. Lungs:  Clear throughout to auscultation.    Heart:  Regular rate and rhythm. Abdomen:   Soft, nontender and nondistended. Normal bowel sounds, without guarding, and without rebound.   Neurologic:  Alert and  oriented x4;  grossly normal neurologically.  Impression/Plan: Amber Stanley is here for an endoscopy and colonoscopy to be performed for iron deficiency anemia  Risks, benefits, limitations, and alternatives regarding  endoscopy and colonoscopy have been reviewed with the patient.  Questions have been answered.  All parties agreeable.   Amber Bellows, MD  09/05/2016, 8:25 AM

## 2016-09-05 NOTE — Anesthesia Preprocedure Evaluation (Signed)
Anesthesia Evaluation  Patient identified by MRN, date of birth, ID band Patient awake    Reviewed: Allergy & Precautions, H&P , NPO status , Patient's Chart, lab work & pertinent test results  History of Anesthesia Complications Negative for: history of anesthetic complications  Airway Mallampati: III  TM Distance: >3 FB Neck ROM: full    Dental  (+) Poor Dentition, Chipped   Pulmonary neg shortness of breath, asthma , former smoker,    Pulmonary exam normal breath sounds clear to auscultation       Cardiovascular Exercise Tolerance: Good hypertension, (-) angina(-) Past MI and (-) DOE Normal cardiovascular exam Rhythm:regular Rate:Normal     Neuro/Psych negative neurological ROS  negative psych ROS   GI/Hepatic Neg liver ROS, GERD  Controlled,  Endo/Other  diabetes, Type 2Hypothyroidism   Renal/GU negative Renal ROS  negative genitourinary   Musculoskeletal   Abdominal   Peds  Hematology negative hematology ROS (+)   Anesthesia Other Findings Past Medical History: No date: Asthma No date: Diabetes mellitus without complication ([place]) [id number]: Hypercholesterolemia No date: Hypertension 08/26/2015: Morbid obesity ([place]) 08/10/2016: Obesity No date: Reflux No date: Thyroid disease  Past Surgical History: No date: BILATERAL CARPAL TUNNEL RELEASE No date: CESAREAN SECTION No date: CHOLECYSTECTOMY No date: DILATION AND CURETTAGE OF UTERUS No date: SESMOIDECTOMY     Comment: removed from foot  BMI    Body Mass Index:  36.49 kg/m      Reproductive/Obstetrics negative OB ROS                             Anesthesia Physical Anesthesia Plan  ASA: III  Anesthesia Plan: General   Post-op Pain Management:    Induction:   Airway Management Planned:   Additional Equipment:   Intra-op Plan:   Post-operative Plan:   Informed Consent: I have reviewed the patients History and  Physical, chart, labs and discussed the procedure including the risks, benefits and alternatives for the proposed anesthesia with the patient or authorized representative who has indicated his/her understanding and acceptance.   Dental Advisory Given  Plan Discussed with: Anesthesiologist, CRNA and Surgeon  Anesthesia Plan Comments:         Anesthesia Quick Evaluation

## 2016-09-05 NOTE — Op Note (Signed)
[facility] Gastroenterology Patient Name: Amber Stanley Procedure Date: 09/05/2016 8:44 AM MRN: [id number] Account #: [id number] Date of Birth: 18-Jun-1971 Admit Type: Outpatient Age: 46 Room: [facility] ENDO ROOM 1 Gender: Female Note Status: Finalized Procedure:            Upper GI endoscopy Indications:          Iron deficiency anemia Providers:            Jonathon Bellows MD, MD Referring MD:         Arnetha Courser (Referring MD) Medicines:            Monitored Anesthesia Care Complications:        No immediate complications. Procedure:            Pre-Anesthesia Assessment:                       - Prior to the procedure, a History and Physical was                        performed, and patient medications, allergies and                        sensitivities were reviewed. The patient's tolerance of                        previous anesthesia was reviewed.                       - The risks and benefits of the procedure and the                        sedation options and risks were discussed with the                        patient. All questions were answered and informed                        consent was obtained.                       - The risks and benefits of the procedure and the                        sedation options and risks were discussed with the                        patient. All questions were answered and informed                        consent was obtained.                       - ASA Grade Assessment: III - A patient with severe                        systemic disease.                       After obtaining informed consent, the endoscope was  passed under direct vision. Throughout the procedure,                        the patient's blood pressure, pulse, and oxygen                        saturations were monitored continuously. The Endoscope                        was introduced through the mouth, and advanced to the          third part of duodenum. The upper GI endoscopy was                        accomplished with ease. The patient tolerated the                        procedure well. Findings:      The stomach was normal.      The esophagus was normal.      The examined duodenum was normal. Biopsies for histology were taken with       a cold forceps for evaluation of celiac disease. Impression:           - Normal stomach.                       - Normal esophagus.                       - Normal examined duodenum. Biopsied. Recommendation:       - Patient has a contact number available for                        emergencies. The signs and symptoms of potential                        delayed complications were discussed with the patient.                        Return to normal activities tomorrow. Written discharge                        instructions were provided to the patient.                       - Resume previous diet.                       - Continue present medications.                       - Await pathology results.                       - Discharge patient to home (with escort).                       - Return to my office as previously scheduled. Procedure Code(s):    --- Professional ---                       [phone], Esophagogastroduodenoscopy, flexible, transoral;  with biopsy, single or multiple Diagnosis Code(s):    --- Professional ---                       D50.9, Iron deficiency anemia, unspecified CPT copyright 2016 American Medical Association. All rights reserved. The codes documented in this report are preliminary and upon coder review may  be revised to meet current compliance requirements. Jonathon Bellows, MD Jonathon Bellows MD, MD 09/05/2016 9:06:11 AM This report has been signed electronically. Number of Addenda: 0 Note Initiated On: 09/05/2016 8:44 AM      [facility]

## 2016-09-06 LAB — SURGICAL PATHOLOGY

## 2016-09-08 ENCOUNTER — Encounter: Payer: Self-pay | Admitting: Gastroenterology

## 2016-09-13 ENCOUNTER — Encounter: Payer: Self-pay | Admitting: Gastroenterology

## 2016-09-21 DIAGNOSIS — E119 Type 2 diabetes mellitus without complications: Secondary | ICD-10-CM | POA: Diagnosis not present

## 2016-10-06 DIAGNOSIS — E039 Hypothyroidism, unspecified: Secondary | ICD-10-CM | POA: Diagnosis not present

## 2016-10-06 DIAGNOSIS — E119 Type 2 diabetes mellitus without complications: Secondary | ICD-10-CM | POA: Diagnosis not present

## 2016-10-17 ENCOUNTER — Other Ambulatory Visit: Payer: Self-pay | Admitting: Family Medicine

## 2016-10-17 NOTE — Telephone Encounter (Signed)
Last (acute) OV: 08/26/15 Last routine OV: 12/04/14 None on file.

## 2016-11-15 ENCOUNTER — Ambulatory Visit: Payer: 59 | Admitting: Family Medicine

## 2016-11-21 ENCOUNTER — Encounter: Payer: Self-pay | Admitting: Family Medicine

## 2016-11-21 ENCOUNTER — Ambulatory Visit (INDEPENDENT_AMBULATORY_CARE_PROVIDER_SITE_OTHER): Payer: 59 | Admitting: Family Medicine

## 2016-11-21 DIAGNOSIS — E119 Type 2 diabetes mellitus without complications: Secondary | ICD-10-CM

## 2016-11-21 DIAGNOSIS — Z6841 Body Mass Index (BMI) 40.0 and over, adult: Secondary | ICD-10-CM

## 2016-11-21 NOTE — Progress Notes (Signed)
BP 118/72   Pulse 94   Temp 98.5 F (36.9 C) (Oral)   Wt 253 lb 14.4 oz (115.2 kg)   LMP 10/27/2016   SpO2 98%   BMI 38.61 kg/m    Subjective:    Patient ID: Amber Stanley, female    DOB: [birth year], 46 y.o.   MRN: [id number]  HPI: Amber Stanley is a 46 y.o. female  Chief Complaint  Patient presents with  . Follow-up    monitor weight    Type 2 diabetes; managed by endocrinologist; no problems with feet  Obesity; gained a little weight since early January visit; eats breakfast late around 9:30 or 10:00 (light) but gets up at 4:30 am; does not eat breakfast right away; takes thyroid med right before leaving; might change that up; lunch between 12 and 1:30, normal lunch; brings food sometimes and goes out, just depends; [place] [employer] subs, mayo, [place] sub, cheese; eats cheese as snack Not interested in seeing nutritionist if insurance won't pay for it; then realized she has met her deductible and would like to go  On victoza for diabetes; can't see a benefit; Dr. Gabriel Carina manages diabetes Lab Results  Component Value Date   TSH 1.530 01/19/2016  discussed topiramate for weight loss; however, not on OCPs, not trying to get pregnant but not trying to prevent pregnancy, and would have baby if she got pregnant, so she decided not to take the topamax  Depression screen [facility] 2/9 11/21/2016 08/10/2016 05/15/2016 01/19/2016  Decreased Interest 0 0 0 0  Down, Depressed, Hopeless 0 0 0 0  PHQ - 2 Score 0 0 0 0   Relevant past medical, surgical, family and social history reviewed Past Medical History:  Diagnosis Date  . Asthma   . Diabetes mellitus without complication ([place])   . Hypercholesterolemia 07/03/2011  . Hypertension   . Morbid obesity ([place]) 08/26/2015  . Obesity 08/10/2016  . Reflux   . Thyroid disease    Past Surgical History:  Procedure Laterality Date  . BILATERAL CARPAL TUNNEL RELEASE    . CESAREAN SECTION    . CHOLECYSTECTOMY    . COLONOSCOPY WITH PROPOFOL N/A  09/05/2016   Procedure: COLONOSCOPY WITH PROPOFOL;  Surgeon: Jonathon Bellows, MD;  Location: [facility] ENDOSCOPY;  Service: Endoscopy;  Laterality: N/A;  . DILATION AND CURETTAGE OF UTERUS    . ESOPHAGOGASTRODUODENOSCOPY (EGD) WITH PROPOFOL N/A 09/05/2016   Procedure: ESOPHAGOGASTRODUODENOSCOPY (EGD) WITH PROPOFOL;  Surgeon: Jonathon Bellows, MD;  Location: [facility] ENDOSCOPY;  Service: Endoscopy;  Laterality: N/A;  . SESMOIDECTOMY     removed from foot   Social History  Substance Use Topics  . Smoking status: Former Smoker    Packs/day: 1.50    Years: 20.00    Types: Cigarettes    Quit date: 09/10/2005  . Smokeless tobacco: Never Used     Comment: quit 7 years ago   . Alcohol use No   Interim medical history since last visit reviewed. Allergies and medications reviewed  Review of Systems Per HPI unless specifically indicated above     Objective:    BP 118/72   Pulse 94   Temp 98.5 F (36.9 C) (Oral)   Wt 253 lb 14.4 oz (115.2 kg)   LMP 10/27/2016   SpO2 98%   BMI 38.61 kg/m   Wt Readings from Last 3 Encounters:  11/21/16 253 lb 14.4 oz (115.2 kg)  09/05/16 240 lb (108.9 kg)  08/29/16 251 lb (113.9 kg)    Physical Exam  Constitutional: She appears well-developed and well-nourished.  obese  HENT:  Mouth/Throat: Mucous membranes are normal.  Eyes: EOM are normal. No scleral icterus.  Cardiovascular: Normal rate and regular rhythm.   Pulmonary/Chest: Effort normal and breath sounds normal.  Abdominal: She exhibits no distension.  Musculoskeletal: She exhibits no edema.  Neurological: She is alert.  Psychiatric: She has a normal mood and affect. Her behavior is normal.   Diabetic Foot Form - Detailed   Diabetic Foot Exam - detailed Diabetic Foot exam was performed with the following findings:  Yes 11/21/2016 10:58 AM  Visual Foot Exam completed.:  Yes  Are the toenails ingrown?:  No Normal Range of Motion:  Yes Pulse Foot Exam completed.:  Yes  Right Dorsalis Pedis:  Present Left  Dorsalis Pedis:  Present  Sensory Foot Exam Completed.:  Yes Swelling:  No Semmes-Weinstein Monofilament Test R Site 1-Great Toe:  Pos L Site 1-Great Toe:  Pos  R Site 4:  Pos L Site 4:  Pos  R Site 5:  Pos L Site 5:  Pos          Assessment & Plan:   Problem List Items Addressed This Visit      Endocrine   Type 2 diabetes mellitus ([place])    Weight loss would benefit sugars; refer for counseling with nutritionist      Relevant Orders   Ambulatory referral to diabetic education     Other   Morbid obesity with BMI of 40.0-44.9, adult (HCC)    BMI > 35 plus diabetes; refer to nutritionist; see AVS      Relevant Orders   Ambulatory referral to diabetic education      Follow up plan: Return in about 3 months (around 02/20/2017) for follow-up.  An after-visit summary was printed and given to the patient at [facility].  Please see the patient instructions which may contain other information and recommendations beyond what is mentioned above in the assessment and plan.  No orders of the defined types were placed in this encounter.  Orders Placed This Encounter  Procedures  . Ambulatory referral to diabetic education

## 2016-11-21 NOTE — Assessment & Plan Note (Signed)
Weight loss would benefit sugars; refer for counseling with nutritionist

## 2016-11-21 NOTE — Assessment & Plan Note (Addendum)
BMI > 35 plus diabetes; refer to nutritionist; see AVS; talked withpatient about options for weight loss including topiramate, but she is not trying to prevent pregnancy and would have a baby if she did conceive, so not the best option; she'll try limiting portions, getting more active; she has a fit bit; don't skip meals; encouragement given; return in 3 months for recheck

## 2016-11-21 NOTE — Patient Instructions (Signed)
Check out the information at familydoctor.org entitled "Nutrition for Weight Loss: What You Need to Know about Fad Diets" Try to lose between 1-2 pounds per week by taking in fewer calories and burning off more calories You can succeed by limiting portions, limiting foods dense in calories and fat, becoming more active, and drinking 8 glasses of water a day (64 ounces) Don't skip meals, especially breakfast, as skipping meals may alter your metabolism Do not use over-the-counter weight loss pills or gimmicks that claim rapid weight loss A healthy BMI (or body mass index) is between 18.5 and 24.9 You can calculate your ideal BMI at the [place] website [employer]fr Start moving more Eat a little something in the morning Trim off the excess empty calories Consider hypnosis

## 2017-01-16 DIAGNOSIS — E039 Hypothyroidism, unspecified: Secondary | ICD-10-CM | POA: Diagnosis not present

## 2017-01-16 DIAGNOSIS — E119 Type 2 diabetes mellitus without complications: Secondary | ICD-10-CM | POA: Diagnosis not present

## 2017-01-19 DIAGNOSIS — E039 Hypothyroidism, unspecified: Secondary | ICD-10-CM | POA: Diagnosis not present

## 2017-01-19 DIAGNOSIS — Z9641 Presence of insulin pump (external) (internal): Secondary | ICD-10-CM | POA: Diagnosis not present

## 2017-01-19 DIAGNOSIS — E119 Type 2 diabetes mellitus without complications: Secondary | ICD-10-CM | POA: Diagnosis not present

## 2017-01-22 ENCOUNTER — Encounter: Payer: 59 | Admitting: Family Medicine

## 2017-01-31 ENCOUNTER — Other Ambulatory Visit: Payer: Self-pay

## 2017-01-31 ENCOUNTER — Encounter: Payer: Self-pay | Admitting: Family Medicine

## 2017-01-31 ENCOUNTER — Ambulatory Visit (INDEPENDENT_AMBULATORY_CARE_PROVIDER_SITE_OTHER): Payer: 59 | Admitting: Family Medicine

## 2017-01-31 VITALS — BP 116/68 | HR 79 | Temp 98.3°F | Resp 16 | Ht 67.8 in | Wt 247.4 lb

## 2017-01-31 DIAGNOSIS — E7800 Pure hypercholesterolemia, unspecified: Secondary | ICD-10-CM | POA: Diagnosis not present

## 2017-01-31 DIAGNOSIS — D508 Other iron deficiency anemias: Secondary | ICD-10-CM | POA: Diagnosis not present

## 2017-01-31 DIAGNOSIS — Z1231 Encounter for screening mammogram for malignant neoplasm of breast: Secondary | ICD-10-CM | POA: Diagnosis not present

## 2017-01-31 DIAGNOSIS — Z0000 Encounter for general adult medical examination without abnormal findings: Secondary | ICD-10-CM

## 2017-01-31 DIAGNOSIS — Z6837 Body mass index (BMI) 37.0-37.9, adult: Secondary | ICD-10-CM | POA: Diagnosis not present

## 2017-01-31 DIAGNOSIS — Z1239 Encounter for other screening for malignant neoplasm of breast: Secondary | ICD-10-CM

## 2017-01-31 NOTE — Patient Instructions (Addendum)
Have labs done please If you have not heard anything from my staff in a week about any orders/referrals/studies from today, please contact us here to follow-up [phone]  Health Maintenance, Female Adopting a healthy lifestyle and getting preventive care can go a long way to promote health and wellness. Talk with your health care provider about what schedule of regular examinations is right for you. This is a good chance for you to check in with your provider about disease prevention and staying healthy. In between checkups, there are plenty of things you can do on your own. Experts have done a lot of research about which lifestyle changes and preventive measures are most likely to keep you healthy. Ask your health care provider for more information. Weight and diet Eat a healthy diet  Be sure to include plenty of vegetables, fruits, low-fat dairy products, and lean protein.  Do not eat a lot of foods high in solid fats, added sugars, or salt.  Get regular exercise. This is one of the most important things you can do for your health. ? Most adults should exercise for at least 150 minutes each week. The exercise should increase your heart rate and make you sweat (moderate-intensity exercise). ? Most adults should also do strengthening exercises at least twice a week. This is in addition to the moderate-intensity exercise.  Maintain a healthy weight  Body mass index (BMI) is a measurement that can be used to identify possible weight problems. It estimates body fat based on height and weight. Your health care provider can help determine your BMI and help you achieve or maintain a healthy weight.  For females 23 years of age and older: ? A BMI below 18.5 is considered underweight. ? A BMI of 18.5 to 24.9 is normal. ? A BMI of 25 to 29.9 is considered overweight. ? A BMI of 30 and above is considered obese.  Watch levels of cholesterol and blood lipids  You should start having your blood  tested for lipids and cholesterol at 46 years of age, then have this test every 5 years.  You may need to have your cholesterol levels checked more often if: ? Your lipid or cholesterol levels are high. ? You are older than 46 years of age. ? You are at high risk for heart disease.  Cancer screening Lung Cancer  Lung cancer screening is recommended for adults 64-85 years old who are at high risk for lung cancer because of a history of smoking.  A yearly low-dose CT scan of the lungs is recommended for people who: ? Currently smoke. ? Have quit within the past 15 years. ? Have at least a 30-pack-year history of smoking. A pack year is smoking an average of one pack of cigarettes a day for 1 year.  Yearly screening should continue until it has been 15 years since you quit.  Yearly screening should stop if you develop a health problem that would prevent you from having lung cancer treatment.  Breast Cancer  Practice breast self-awareness. This means understanding how your breasts normally appear and feel.  It also means doing regular breast self-exams. Let your health care provider know about any changes, no matter how small.  If you are in your 20s or 30s, you should have a clinical breast exam (CBE) by a health care provider every 1-3 years as part of a regular health exam.  If you are 24 or older, have a CBE every year. Also consider having a breast  X-ray (mammogram) every year.  If you have a family history of breast cancer, talk to your health care provider about genetic screening.  If you are at high risk for breast cancer, talk to your health care provider about having an MRI and a mammogram every year.  Breast cancer gene (BRCA) assessment is recommended for women who have family members with BRCA-related cancers. BRCA-related cancers include: ? Breast. ? Ovarian. ? Tubal. ? Peritoneal cancers.  Results of the assessment will determine the need for genetic counseling and  BRCA1 and BRCA2 testing.  Cervical Cancer Your health care provider may recommend that you be screened regularly for cancer of the pelvic organs (ovaries, uterus, and vagina). This screening involves a pelvic examination, including checking for microscopic changes to the surface of your cervix (Pap test). You may be encouraged to have this screening done every 3 years, beginning at age 41.  For women ages 53-65, health care providers may recommend pelvic exams and Pap testing every 3 years, or they may recommend the Pap and pelvic exam, combined with testing for human papilloma virus (HPV), every 5 years. Some types of HPV increase your risk of cervical cancer. Testing for HPV may also be done on women of any age with unclear Pap test results.  Other health care providers may not recommend any screening for nonpregnant women who are considered low risk for pelvic cancer and who do not have symptoms. Ask your health care provider if a screening pelvic exam is right for you.  If you have had past treatment for cervical cancer or a condition that could lead to cancer, you need Pap tests and screening for cancer for at least 20 years after your treatment. If Pap tests have been discontinued, your risk factors (such as having a new sexual partner) need to be reassessed to determine if screening should resume. Some women have medical problems that increase the chance of getting cervical cancer. In these cases, your health care provider may recommend more frequent screening and Pap tests.  Colorectal Cancer  This type of cancer can be detected and often prevented.  Routine colorectal cancer screening usually begins at 46 years of age and continues through 46 years of age.  Your health care provider may recommend screening at an earlier age if you have risk factors for colon cancer.  Your health care provider may also recommend using home test kits to check for hidden blood in the stool.  A small camera  at the end of a tube can be used to examine your colon directly (sigmoidoscopy or colonoscopy). This is done to check for the earliest forms of colorectal cancer.  Routine screening usually begins at age 69.  Direct examination of the colon should be repeated every 5-10 years through 46 years of age. However, you may need to be screened more often if early forms of precancerous polyps or small growths are found.  Skin Cancer  Check your skin from head to toe regularly.  Tell your health care provider about any new moles or changes in moles, especially if there is a change in a mole's shape or color.  Also tell your health care provider if you have a mole that is larger than the size of a pencil eraser.  Always use sunscreen. Apply sunscreen liberally and repeatedly throughout the day.  Protect yourself by wearing long sleeves, pants, a wide-brimmed hat, and sunglasses whenever you are outside.  Heart disease, diabetes, and high blood pressure  High blood  pressure causes heart disease and increases the risk of stroke. High blood pressure is more likely to develop in: ? People who have blood pressure in the high end of the normal range (130-139/85-89 mm Hg). ? People who are overweight or obese. ? People who are African American.  If you are 67-37 years of age, have your blood pressure checked every 3-5 years. If you are 29 years of age or older, have your blood pressure checked every year. You should have your blood pressure measured twice-once when you are at a hospital or clinic, and once when you are not at a hospital or clinic. Record the average of the two measurements. To check your blood pressure when you are not at a hospital or clinic, you can use: ? An automated blood pressure machine at a pharmacy. ? A home blood pressure monitor.  If you are between 6 years and 14 years old, ask your health care provider if you should take aspirin to prevent strokes.  Have regular diabetes  screenings. This involves taking a blood sample to check your fasting blood sugar level. ? If you are at a normal weight and have a low risk for diabetes, have this test once every three years after 46 years of age. ? If you are overweight and have a high risk for diabetes, consider being tested at a younger age or more often. Preventing infection Hepatitis B  If you have a higher risk for hepatitis B, you should be screened for this virus. You are considered at high risk for hepatitis B if: ? You were born in a country where hepatitis B is common. Ask your health care provider which countries are considered high risk. ? Your parents were born in a high-risk country, and you have not been immunized against hepatitis B (hepatitis B vaccine). ? You have HIV or AIDS. ? You use needles to inject street drugs. ? You live with someone who has hepatitis B. ? You have had sex with someone who has hepatitis B. ? You get hemodialysis treatment. ? You take certain medicines for conditions, including cancer, organ transplantation, and autoimmune conditions.  Hepatitis C  Blood testing is recommended for: ? Everyone born from [birth year] through 1965. ? Anyone with known risk factors for hepatitis C.  Sexually transmitted infections (STIs)  You should be screened for sexually transmitted infections (STIs) including gonorrhea and chlamydia if: ? You are sexually active and are younger than 46 years of age. ? You are older than 46 years of age and your health care provider tells you that you are at risk for this type of infection. ? Your sexual activity has changed since you were last screened and you are at an increased risk for chlamydia or gonorrhea. Ask your health care provider if you are at risk.  If you do not have HIV, but are at risk, it may be recommended that you take a prescription medicine daily to prevent HIV infection. This is called pre-exposure prophylaxis (PrEP). You are considered at risk  if: ? You are sexually active and do not regularly use condoms or know the HIV status of your partner(s). ? You take drugs by injection. ? You are sexually active with a partner who has HIV.  Talk with your health care provider about whether you are at high risk of being infected with HIV. If you choose to begin PrEP, you should first be tested for HIV. You should then be tested every 3 months for as  long as you are taking PrEP. Pregnancy  If you are premenopausal and you may become pregnant, ask your health care provider about preconception counseling.  If you may become pregnant, take 400 to 800 micrograms (mcg) of folic acid every day.  If you want to prevent pregnancy, talk to your health care provider about birth control (contraception). Osteoporosis and menopause  Osteoporosis is a disease in which the bones lose minerals and strength with aging. This can result in serious bone fractures. Your risk for osteoporosis can be identified using a bone density scan.  If you are 94 years of age or older, or if you are at risk for osteoporosis and fractures, ask your health care provider if you should be screened.  Ask your health care provider whether you should take a calcium or vitamin D supplement to lower your risk for osteoporosis.  Menopause may have certain physical symptoms and risks.  Hormone replacement therapy may reduce some of these symptoms and risks. Talk to your health care provider about whether hormone replacement therapy is right for you. Follow these instructions at home:  Schedule regular health, dental, and eye exams.  Stay current with your immunizations.  Do not use any tobacco products including cigarettes, chewing tobacco, or electronic cigarettes.  If you are pregnant, do not drink alcohol.  If you are breastfeeding, limit how much and how often you drink alcohol.  Limit alcohol intake to no more than 1 drink per day for nonpregnant women. One drink  equals 12 ounces of beer, 5 ounces of wine, or 1 ounces of hard liquor.  Do not use street drugs.  Do not share needles.  Ask your health care provider for help if you need support or information about quitting drugs.  Tell your health care provider if you often feel depressed.  Tell your health care provider if you have ever been abused or do not feel safe at home. This information is not intended to replace advice given to you by your health care provider. Make sure you discuss any questions you have with your health care provider. Document Released: 02/20/2011 Document Revised: 01/13/2016 Document Reviewed: 05/11/2015 Elsevier Interactive Patient Education  [employer].

## 2017-01-31 NOTE — Assessment & Plan Note (Signed)
Check CBC and ferritin 

## 2017-01-31 NOTE — Progress Notes (Signed)
Patient ID: Amber Stanley, female   DOB: 08-10-1971, 46 y.o.   MRN: [id number]   Subjective:   Amber Stanley is a 46 y.o. female here for a complete physical exam  USPSTF grade A and B recommendations Depression:  Depression screen [facility] 2/9 11/21/2016 08/10/2016 05/15/2016 01/19/2016  Decreased Interest 0 0 0 0  Down, Depressed, Hopeless 0 0 0 0  PHQ - 2 Score 0 0 0 0  PHQ-2 not done today, but MD talked w/patient, she is not depressed  Hypertension: BP Readings from Last 3 Encounters:  01/31/17 116/68  11/21/16 118/72  09/05/16 117/87   Obesity: lost 6.5 pounds since last visit, 2+ months, doing Weight Watchers; father is 270 pounds in his 56s and healthy  Wt Readings from Last 3 Encounters:  01/31/17 247 lb 6.4 oz (112.2 kg)  11/21/16 253 lb 14.4 oz (115.2 kg)  09/05/16 240 lb (108.9 kg)   BMI Readings from Last 3 Encounters:  01/31/17 37.84 kg/m  11/21/16 38.61 kg/m  09/05/16 36.49 kg/m    Alcohol: no Tobacco use: former, quit with chantix, over 10 years ago HIV, hep B, hep C: declined STD testing and prevention (chl/gon/syphilis): declined Intimate partner violence: no abuse Breast cancer: done by Midwife BRCA gene screening: done by Midwife Cervical cancer screening: done by Midwife Osteoporosis: n/a Fall prevention/vitamin D: taking 10k per week Lipids:  Lab Results  Component Value Date   CHOL 171 01/31/2017   CHOL 161 01/19/2016   Lab Results  Component Value Date   HDL 31 (L) 01/31/2017   HDL 28 (L) 01/19/2016   Lab Results  Component Value Date   LDLCALC 100 (H) 01/31/2017   LDLCALC 96 01/19/2016   Lab Results  Component Value Date   TRIG 200 (H) 01/31/2017   TRIG 183 (H) 01/19/2016  not completely fasting; sandwich (wheat) chicken and cheese (sliced cheese), veggie straws, strawberries, no sugary drink Had labs done May 2018 with [facility] Hx of low iron and CBC; had endoscopy and colonoscopy  Lab Results  Component Value Date    CHOLHDL 5.5 (H) 01/31/2017   No results found for: LDLDIRECT Glucose:  Glucose  Date Value Ref Range Status  01/19/2016 158 (H) 65 - 99 mg/dL Final   Glucose-Capillary  Date Value Ref Range Status  09/05/2016 94 65 - 99 mg/dL Final  09/05/2016 86 65 - 99 mg/dL Final  diabetes managed by endo   Past Medical History:  Diagnosis Date  . Asthma   . Diabetes mellitus without complication ([place])   . Hypercholesterolemia 07/03/2011  . Hypertension   . Morbid obesity ([place]) 08/26/2015  . Obesity 08/10/2016  . Reflux   . Thyroid disease    Past Surgical History:  Procedure Laterality Date  . BILATERAL CARPAL TUNNEL RELEASE    . CESAREAN SECTION    . CHOLECYSTECTOMY    . COLONOSCOPY WITH PROPOFOL N/A 09/05/2016   Procedure: COLONOSCOPY WITH PROPOFOL;  Surgeon: Jonathon Bellows, MD;  Location: [facility] ENDOSCOPY;  Service: Endoscopy;  Laterality: N/A;  . DILATION AND CURETTAGE OF UTERUS    . ESOPHAGOGASTRODUODENOSCOPY (EGD) WITH PROPOFOL N/A 09/05/2016   Procedure: ESOPHAGOGASTRODUODENOSCOPY (EGD) WITH PROPOFOL;  Surgeon: Jonathon Bellows, MD;  Location: [facility] ENDOSCOPY;  Service: Endoscopy;  Laterality: N/A;  . SESMOIDECTOMY     removed from foot   Family History  Problem Relation Age of Onset  . Diabetes Mother   . Hypertension Mother   . Heart disease Sister   . Cancer Brother  leukemia  . Stroke Maternal Grandmother   . Hypertension Maternal Grandmother   . Diabetes Paternal Grandmother   . Hypertension Paternal Grandmother   . Stroke Paternal Grandmother        mini strokes  . Ulcers Maternal Grandfather   . COPD Neg Hx    Social History  Substance Use Topics  . Smoking status: Former Smoker    Packs/day: 1.50    Years: 20.00    Types: Cigarettes    Quit date: 09/10/2005  . Smokeless tobacco: Never Used     Comment: quit 7 years ago   . Alcohol use No   Review of Systems  Objective:   Vitals:   01/31/17 1411  BP: 116/68  Pulse: 79  Resp: 16  Temp: 98.3 F (36.8 C)   TempSrc: Oral  SpO2: 99%  Weight: 247 lb 6.4 oz (112.2 kg)  Height: 5' 7.8" (1.722 m)   Body mass index is 37.84 kg/m. Wt Readings from Last 3 Encounters:  01/31/17 247 lb 6.4 oz (112.2 kg)  11/21/16 253 lb 14.4 oz (115.2 kg)  09/05/16 240 lb (108.9 kg)   Physical Exam  Constitutional: She appears well-developed and well-nourished.  obese  HENT:  Head: Normocephalic and atraumatic.  Right Ear: Hearing, tympanic membrane, external ear and ear canal normal.  Left Ear: Hearing, tympanic membrane, external ear and ear canal normal.  Eyes: Conjunctivae and EOM are normal. Right eye exhibits no hordeolum. Left eye exhibits no hordeolum. No scleral icterus.  Neck: Carotid bruit is not present. No thyromegaly present.  Cardiovascular: Normal rate, regular rhythm, S1 normal, S2 normal and normal heart sounds.   No extrasystoles are present.  Pulmonary/Chest: Effort normal and breath sounds normal. No respiratory distress.  Abdominal: Soft. Normal appearance and bowel sounds are normal. She exhibits no distension, no abdominal bruit, no pulsatile midline mass and no mass. There is no hepatosplenomegaly. There is no tenderness. No hernia.  Musculoskeletal: Normal range of motion. She exhibits no edema.  Lymphadenopathy:       Head (right side): No submandibular adenopathy present.       Head (left side): No submandibular adenopathy present.    She has no cervical adenopathy.  Neurological: She is alert. She displays no tremor. No cranial nerve deficit. She exhibits normal muscle tone. Gait normal.  Reflex Scores:      Patellar reflexes are 2+ on the right side and 2+ on the left side. Skin: Skin is warm and dry. No bruising and no ecchymosis noted. No cyanosis. No pallor.  Psychiatric: Her speech is normal and behavior is normal. Thought content normal. Her mood appears not anxious. She does not exhibit a depressed mood.   Diabetic Foot Form - Detailed   Diabetic Foot Exam -  detailed Diabetic Foot exam was performed with the following findings:  Yes 02/05/2017  2:38 PM  Visual Foot Exam completed.:  Yes  Can the patient see the bottom of their feet?:  Yes Are the shoes appropriate in style and fit?:  No Pulse Foot Exam completed.:  Yes  Right Dorsalis Pedis:  Present Left Dorsalis Pedis:  Present  Sensory Foot Exam Completed.:  Yes Semmes-Weinstein Monofilament Test R Site 1-Great Toe:  Pos L Site 1-Great Toe:  Pos        Assessment/Plan:   Problem List Items Addressed This Visit      Other   Preventative health care - Primary    USPSTF grade A and B recommendations reviewed with patient;  age-appropriate recommendations, preventive care, screening tests, etc discussed and encouraged; healthy living encouraged; see AVS for patient education given to patient       RESOLVED: Obesity    Praise given for weight loss      Morbid obesity ([place])    BMI > 35 plus diabetes; encouragement given for weight loss      Iron deficiency anemia    Check CBC and ferritin      Relevant Orders   CBC with Differential/Platelet (Completed)   Ferritin (Completed)   High cholesterol    Check lipids today      Relevant Orders   Lipid panel (Completed)    Other Visit Diagnoses    Screening for breast cancer       Relevant Orders   MM Digital Screening       Meds ordered this encounter  Medications  . ranitidine (ZANTAC) 75 MG tablet    Sig: Take 75 mg by mouth 2 (two) times daily.   Orders Placed This Encounter  Procedures  . MM Digital Screening    Standing Status:   Future    Standing Expiration Date:   04/02/2018    Order Specific Question:   Reason for Exam (SYMPTOM  OR DIAGNOSIS REQUIRED)    Answer:   yearly screening for breast cancer    Order Specific Question:   Is the patient pregnant?    Answer:   No    Order Specific Question:   Preferred imaging location?    Answer:   [facility]  . Lipid panel  . CBC with Differential/Platelet   . Ferritin    Follow up plan: Return in about 1 year (around 01/31/2018) for complete physical.  An After Visit Summary was printed and given to the patient.

## 2017-01-31 NOTE — Assessment & Plan Note (Signed)
USPSTF grade A and B recommendations reviewed with patient; age-appropriate recommendations, preventive care, screening tests, etc discussed and encouraged; healthy living encouraged; see AVS for patient education given to patient  

## 2017-01-31 NOTE — Telephone Encounter (Signed)
Patient states she was getting at [facility]

## 2017-01-31 NOTE — Assessment & Plan Note (Signed)
Praise given for weight loss 

## 2017-01-31 NOTE — Assessment & Plan Note (Signed)
Check lipids today 

## 2017-02-01 LAB — CBC WITH DIFFERENTIAL/PLATELET
Basophils Absolute: 0 10*3/uL (ref 0.0–0.2)
Basos: 1 %
EOS (ABSOLUTE): 0.1 10*3/uL (ref 0.0–0.4)
EOS: 2 %
HEMATOCRIT: 38.5 % (ref 34.0–46.6)
Hemoglobin: 12.2 g/dL (ref 11.1–15.9)
Immature Grans (Abs): 0 10*3/uL (ref 0.0–0.1)
Immature Granulocytes: 0 %
LYMPHS ABS: 3.2 10*3/uL — AB (ref 0.7–3.1)
Lymphs: 42 %
MCH: 25.7 pg — ABNORMAL LOW (ref 26.6–33.0)
MCHC: 31.7 g/dL (ref 31.5–35.7)
MCV: 81 fL (ref 79–97)
MONOS ABS: 0.6 10*3/uL (ref 0.1–0.9)
Monocytes: 8 %
Neutrophils Absolute: 3.5 10*3/uL (ref 1.4–7.0)
Neutrophils: 47 %
Platelets: 297 10*3/uL (ref 150–379)
RBC: 4.75 x10E6/uL (ref 3.77–5.28)
RDW: 17.7 % — ABNORMAL HIGH (ref 12.3–15.4)
WBC: 7.5 10*3/uL (ref 3.4–10.8)

## 2017-02-01 LAB — LIPID PANEL
CHOL/HDL RATIO: 5.5 ratio — AB (ref 0.0–4.4)
Cholesterol, Total: 171 mg/dL (ref 100–199)
HDL: 31 mg/dL — AB (ref >39)
LDL Calculated: 100 mg/dL — ABNORMAL HIGH (ref 0–99)
Triglycerides: 200 mg/dL — ABNORMAL HIGH (ref 0–149)
VLDL CHOLESTEROL CAL: 40 mg/dL (ref 5–40)

## 2017-02-01 LAB — FERRITIN: Ferritin: 15 ng/mL (ref 15–150)

## 2017-02-05 NOTE — Assessment & Plan Note (Signed)
BMI > 35 plus diabetes; encouragement given for weight loss

## 2017-02-20 ENCOUNTER — Ambulatory Visit: Payer: 59 | Admitting: Family Medicine

## 2017-03-02 ENCOUNTER — Ambulatory Visit: Payer: 59 | Admitting: Family Medicine

## 2017-03-13 ENCOUNTER — Ambulatory Visit
Admission: RE | Admit: 2017-03-13 | Discharge: 2017-03-13 | Disposition: A | Discharge status: Home / Self Care | Payer: 59 | Source: Ambulatory Visit | Attending: Family Medicine | Admitting: Family Medicine

## 2017-03-13 DIAGNOSIS — Z1239 Encounter for other screening for malignant neoplasm of breast: Secondary | ICD-10-CM

## 2017-03-13 DIAGNOSIS — Z1231 Encounter for screening mammogram for malignant neoplasm of breast: Secondary | ICD-10-CM | POA: Diagnosis not present

## 2017-03-14 ENCOUNTER — Encounter: Payer: Self-pay | Admitting: Family Medicine

## 2017-05-07 ENCOUNTER — Encounter: Payer: Self-pay | Admitting: Certified Nurse Midwife

## 2017-05-07 ENCOUNTER — Ambulatory Visit (INDEPENDENT_AMBULATORY_CARE_PROVIDER_SITE_OTHER): Payer: 59 | Admitting: Certified Nurse Midwife

## 2017-05-07 VITALS — BP 130/80 | HR 94 | Ht 67.0 in | Wt 256.0 lb

## 2017-05-07 DIAGNOSIS — Z01419 Encounter for gynecological examination (general) (routine) without abnormal findings: Secondary | ICD-10-CM | POA: Diagnosis not present

## 2017-05-07 DIAGNOSIS — Z124 Encounter for screening for malignant neoplasm of cervix: Secondary | ICD-10-CM | POA: Diagnosis not present

## 2017-05-07 NOTE — Progress Notes (Signed)
Gynecology Annual Exam  PCP: Arnetha Courser, MD  Chief Complaint:  Chief Complaint  Patient presents with  . Gynecologic Exam    History of Present Illness:Amber Stanley is a 46 year old Caucasian/White female, G1 P1001, who presents for her annual gyn exam.   Her menses are regular and her LMP was 05/04/2017. They occur every 1 month, they last 3-4 days, are medium flow, and are without clots.  She has had no spotting. She is still having mid cycle cramping and premenstraul cramping that starts 1 week before her menses and lasts thru her menses. Occasionally takes ibuprofen 400 mgm which usually helps.   The patient's past medical history is notable for a history of IDDM (has insulin pump), obesity, asthma, GERD, and hypothyroidism.  Since her last annual GYN exam dated 02/23/2016 , she has had no other significant changes in her health history. She has lost 9# since her last annual and her current BMI=40.10.  She is seriously considering bariatric surgery.  She is sexually active. She is currently using natural family planning for contraception.  Her most recent pap smear was obtained 02/23/2016 and was NIL. Her most recent mammogram was obtained this year 03/13/2017 and was negative ( ordered by PCP ). She had a colonoscopy and EGD 09/05/2016 for anemia work up. Results: diverticulosis  There is no family history of breast cancer.  There is no family history of ovarian cancer.  The patient does do monthly self breast exams.  The patient does not smoke. Former smoker.  The patient does rarely drink alcohol.  The patient does not use illegal drugs.  The patient does not exercise. The patient does get adequate calcium in her diet.  She had a recent cholesterol screen in 2018 that was abnormal and with an elevated triglycerides and decreased HDL.    The patient denies current symptoms of depression.    Review of Systems: Review of Systems  Constitutional: Negative for chills,  fever and weight loss.  HENT: Negative for congestion, sinus pain and sore throat.   Eyes: Negative for blurred vision and pain.  Respiratory: Negative for hemoptysis, shortness of breath and wheezing.   Cardiovascular: Negative for chest pain, palpitations and leg swelling.  Gastrointestinal: Negative for abdominal pain, blood in stool, diarrhea, heartburn, nausea and vomiting.  Genitourinary: Negative for dysuria, frequency, hematuria and urgency.  Musculoskeletal: Negative for back pain, joint pain and myalgias.  Skin: Negative for itching and rash.  Neurological: Negative for dizziness, tingling and headaches.  Endo/Heme/Allergies: Negative for environmental allergies and polydipsia. Does not bruise/bleed easily.       Negative for hirsutism   Psychiatric/Behavioral: Negative for depression. The patient is not nervous/anxious and does not have insomnia.     Past Medical History:  Past Medical History:  Diagnosis Date  . Anemia   . Asthma   . Diabetes mellitus without complication ([place])   . Diverticulosis 08/2016  . Hypercholesterolemia 07/03/2011  . Hypertension   . Morbid obesity ([place]) 08/26/2015  . Polycystic ovaries   . Reflux   . Thyroid disease     Past Surgical History:  Past Surgical History:  Procedure Laterality Date  . BILATERAL CARPAL TUNNEL RELEASE  2007  . CESAREAN SECTION  10/09/2010   FTP after IOL then had a PPH at [facility]  . CHOLECYSTECTOMY  1999  . COLONOSCOPY WITH PROPOFOL N/A 09/05/2016   Procedure: COLONOSCOPY WITH PROPOFOL;  Surgeon: Jonathon Bellows, MD;  Location: [facility] ENDOSCOPY;  Service: Endoscopy;  Laterality: N/A;. Diverticulosis  . DILATION AND CURETTAGE OF UTERUS  08/18/2003   endometrial polyps and hyperplasia  . ESOPHAGOGASTRODUODENOSCOPY (EGD) WITH PROPOFOL N/A 09/05/2016   Procedure: ESOPHAGOGASTRODUODENOSCOPY (EGD) WITH PROPOFOL;  Surgeon: Jonathon Bellows, MD;  Location: [facility] ENDOSCOPY;  Service: Endoscopy;  Laterality: N/A;  . SESMOIDECTOMY Left     fractured sesamoid bone removed from foot    Family History:  Family History  Problem Relation Age of Onset  . Diabetes Mother   . Hypertension Mother   . Heart attack Mother 7       died from MI  . Heart disease Sister 101  . Cancer Brother 93       leukemia  . Stroke Maternal Grandmother   . Hypertension Maternal Grandmother   . Diabetes Paternal Grandmother   . Hypertension Paternal Grandmother   . Stroke Paternal Grandmother        mini strokes  . Ulcers Maternal Grandfather   . Lymphoma Cousin   . Hypertension Maternal Uncle   . Kidney failure Maternal Uncle   . Thyroid disease Paternal Aunt   . Multiple sclerosis Paternal Aunt   . COPD Neg Hx   . Breast cancer Neg Hx     Social History:  Social History   Social History  . Marital status: Married    Spouse name: Grayland Ormond  . Number of children: 1  . Years of education: N/A   Occupational History  . Not on file.   Social History Main Topics  . Smoking status: Former Smoker    Packs/day: 1.50    Years: 20.00    Types: Cigarettes    Quit date: 09/10/2005  . Smokeless tobacco: Never Used     Comment: quit 7 years ago   . Alcohol use Yes     Comment: rarely  . Drug use: No  . Sexual activity: Yes    Partners: Male    Birth control/ protection: Rhythm   Other Topics Concern  . Not on file   Social History Narrative  . No narrative on file    Allergies:  Allergies  Allergen Reactions  . Adhesive [Tape] Itching    Skin peels off     Medications: Prior to Admission medications   Medication Sig Start Date End Date Taking? Authorizing Provider  ADVAIR DISKUS 100-50 MCG/DOSE AEPB INHALE 1 PUFF BY MOUTH  TWICE DAILY 10/17/16  Yes Lada, Satira Anis, MD  albuterol (PROAIR HFA) 108 (90 Base) MCG/ACT inhaler Inhale 2 puffs into the lungs every 4 (four) hours as needed for wheezing or shortness of breath. 08/26/15  Yes Lada, Satira Anis, MD  cyanocobalamin (V-R VITAMIN B-12) 500 MCG tablet Take 1,000 mg by mouth  daily.   Yes [provider]  HUMALOG 100 UNIT/ML injection 100 Units. pump 04/28/16  Yes [provider]  Insulin Infusion Pump Supplies (PARADIGM RESERVOIR 3ML) MISC  06/04/13  Yes [provider]  Insulin Pen Needle (FIFTY50 PEN NEEDLES) 32G X 4 MM MISC Inject into the skin. 03/20/17  Yes [provider]  iron polysaccharides (NU-IRON) 150 MG capsule Take 1 capsule (150 mg total) by mouth daily. 08/15/16  Yes Lada, Satira Anis, MD  levothyroxine (SYNTHROID, LEVOTHROID) 75 MCG tablet TAKE 1 TABLET BY MOUTH ONCE DAILY ON AN EMPTY STOMACH  WITH A GLASS OF WATER 30-60 MINUTES BEFORE BREAKFAST 03/16/17  Yes [provider]  metFORMIN (GLUCOPHAGE) 1000 MG tablet TAKE 1 TABLET BY MOUTH TWO  TIMES DAILY WITH MEALS  03/20/17  Yes [provider]  metoprolol succinate (TOPROL-XL) 50 MG 24 hr tablet TAKE 1 TABLET BY MOUTH  DAILY 10/17/16  Yes Lada, Satira Anis, MD  montelukast (SINGULAIR) 10 MG tablet TAKE 1 TABLET BY MOUTH  DAILY 10/17/16  Yes Lada, Satira Anis, MD  omeprazole (PRILOSEC) 20 MG capsule Take 20 mg by mouth daily.   Yes [provider]  ONE TOUCH ULTRA TEST test strip  07/08/13  Yes [provider]  VICTOZA 18 MG/3ML SOPN  03/16/17  Yes [provider]  Vitamin D, Ergocalciferol, (DRISDOL) 50000 units CAPS capsule Take 50,000 Units by mouth 2 (two) times a week.   Yes [provider]  liraglutide (VICTOZA) 18 MG/3ML SOPN Inject 1.8 mg into the skin daily.  03/10/16 03/10/17  [provider]    Physical Exam Vitals:BP 130/80   Pulse 94   Ht 5\' 7"  (1.702 m)   Wt 256 lb (116.1 kg)   LMP 05/04/2017 (Exact Date)   BMI 40.10 kg/m   General: WF in NAD, presents with her 47 year old daughter HEENT: normocephalic, anicteric Neck: no thyroid enlargement, no palpable nodules, no cervical lymphadenopathy  Pulmonary: No increased work of breathing, CTAB Cardiovascular: RRR, without murmur  Breast: Breast  symmetrical, no tenderness, no palpable nodules or masses, no skin or nipple retraction present, no nipple discharge.  No axillary, infraclavicular or supraclavicular lymphadenopathy. Abdomen: Soft, non-tender, non-distended.  Umbilicus without lesions.  No hepatomegaly or masses palpable. No evidence of hernia. Genitourinary:  External: Normal external female genitalia. Some varicose veins noted. Normal urethral meatus, normal Bartholin's and Skene's glands.    Vagina: Normal vaginal mucosa, no evidence of prolapse.    Cervix: Grossly normal in appearance, no bleeding, non-tender  Uterus: Anteverted and deviated to left, normal size, shape, and consistency, mobile, and non-tender  Adnexa: No adnexal masses, non-tender  Rectal: deferred  Lymphatic: no evidence of inguinal lymphadenopathy Extremities: no edema, erythema, or tenderness Neurologic: Grossly intact Psychiatric: mood appropriate, affect full     Assessment: 46 y.o. G1P1001 annual gyn exam  Plan:    1) Breast cancer screening - recommend monthly self breast exam and annual screening mammograms.   2) Colon cancer screening-done 08/2016  3) Cervical cancer screening - Pap was done. ASCCP guidelines and rational discussed.  Patient opts for yearly screening interval  4) Contraception - has been discussed in past/ desires to continue with NFP  5) Routine healthcare maintenance including cholesterol  screening managed by PCP   6) RTO 1 year and prn  Dalia Heading, CNM

## 2017-05-08 ENCOUNTER — Encounter: Payer: Self-pay | Admitting: Certified Nurse Midwife

## 2017-05-08 LAB — IGP,RFX APTIMA HPV ALL PTH: PAP Smear Comment: 0

## 2017-05-10 ENCOUNTER — Ambulatory Visit (INDEPENDENT_AMBULATORY_CARE_PROVIDER_SITE_OTHER): Payer: 59 | Admitting: Family Medicine

## 2017-05-10 ENCOUNTER — Encounter: Payer: Self-pay | Admitting: Family Medicine

## 2017-05-10 DIAGNOSIS — Z23 Encounter for immunization: Secondary | ICD-10-CM

## 2017-05-10 DIAGNOSIS — K219 Gastro-esophageal reflux disease without esophagitis: Secondary | ICD-10-CM

## 2017-05-10 DIAGNOSIS — Z6835 Body mass index (BMI) 35.0-35.9, adult: Secondary | ICD-10-CM

## 2017-05-10 DIAGNOSIS — E119 Type 2 diabetes mellitus without complications: Secondary | ICD-10-CM

## 2017-05-10 NOTE — Assessment & Plan Note (Signed)
Managed by endocrinologist

## 2017-05-10 NOTE — Assessment & Plan Note (Signed)
I do believe that significant weight loss would help her condition, as she pursues bariatric surgery

## 2017-05-10 NOTE — Patient Instructions (Signed)
Check out the information at familydoctor.org entitled "Nutrition for Weight Loss: What You Need to Know about Fad Diets" Try to lose between 1-2 pounds per week by taking in fewer calories and burning off more calories You can succeed by limiting portions, limiting foods dense in calories and fat, becoming more active, and drinking 8 glasses of water a day (64 ounces) Don't skip meals, especially breakfast, as skipping meals may alter your metabolism Do not use over-the-counter weight loss pills or gimmicks that claim rapid weight loss A healthy BMI (or body mass index) is between 18.5 and 24.9 You can calculate your ideal BMI at the NIH website http://www.nhlbi.nih.gov/health/educational/lose_wt/BMI/bmicalc.htm  

## 2017-05-10 NOTE — Progress Notes (Signed)
BP 132/74 (BP Location: Left Arm, Patient Position: Sitting, Cuff Size: Large)   Pulse 97   Temp 98.3 F (36.8 C) (Oral)   Resp 16   Ht 5\' 7"  (1.702 m)   Wt 255 lb 1.6 oz (115.7 kg)   LMP 05/04/2017 (Exact Date)   SpO2 96%   BMI 39.95 kg/m    Subjective:    Patient ID: Amber Stanley, female    DOB: 1971/01/07, 46 y.o.   MRN: [id number]  HPI: Amber Stanley is a 46 y.o. female  Chief Complaint  Patient presents with  . Follow-up    3 mo  . Weight Check    HPI Sees Dr. Gabriel Carina for diabetes; no symptoms of low sugars HTN, systolic just a few points up; does have headaches, but no chest pain; it's sinus headaches Obesity; she has to do the appeal again because she did not meet the goal; she has a consultation appointment with bariatric surgeon She sees them on Tuesday She says when she thinks of weight loss surgery, she thinks of someone who is morbidly obese with fat hanging over their knees She is serious about losing weight and would love to come down or off of diabetes medicines She has done some reading Mother and two uncles have all passed away at "young ages" with diabetes We reviewed together the last notes from Sept 2017, Dec 2017, April 2018, June 2018, and Sept 2018 She did Weight Watchers; started that in May 2018 Peak weight was 275.75 pounds in 1998-ish Related co-mordibities includes type 2 diabetes, high blood pressure, high triglycerides, knee problems, GERD She has hypothyroidism and that is managed by Dr. Gabriel Carina as well  Depression screen [facility] 2/9 05/10/2017 11/21/2016 08/10/2016 05/15/2016 01/19/2016  Decreased Interest 0 0 0 0 0  Down, Depressed, Hopeless 0 0 0 0 0  PHQ - 2 Score 0 0 0 0 0    Relevant past medical, surgical, family and social history reviewed Past Medical History:  Diagnosis Date  . Anemia   . Asthma   . Diabetes mellitus without complication ([place])   . Diverticulosis 08/2016  . Hypercholesterolemia 07/03/2011  . Hypertension   .  Morbid obesity ([place]) 08/26/2015  . Polycystic ovaries   . Reflux   . Thyroid disease    Past Surgical History:  Procedure Laterality Date  . BILATERAL CARPAL TUNNEL RELEASE  2007  . CESAREAN SECTION  10/09/2010   FTP after IOL then had a PPH at [facility]  . CHOLECYSTECTOMY  1999  . COLONOSCOPY WITH PROPOFOL N/A 09/05/2016   Procedure: COLONOSCOPY WITH PROPOFOL;  Surgeon: Jonathon Bellows, MD;  Location: [facility] ENDOSCOPY;  Service: Endoscopy;  Laterality: N/A;. Diverticulosis  . DILATION AND CURETTAGE OF UTERUS  08/18/2003   endometrial polyps and hyperplasia  . ESOPHAGOGASTRODUODENOSCOPY (EGD) WITH PROPOFOL N/A 09/05/2016   Procedure: ESOPHAGOGASTRODUODENOSCOPY (EGD) WITH PROPOFOL;  Surgeon: Jonathon Bellows, MD;  Location: [facility] ENDOSCOPY;  Service: Endoscopy;  Laterality: N/A;  . SESMOIDECTOMY Left    fractured sesamoid bone removed from foot   Family History  Problem Relation Age of Onset  . Diabetes Mother   . Hypertension Mother   . Heart attack Mother 68       died from MI  . Heart disease Sister 71  . Cancer Brother 51       leukemia  . Stroke Maternal Grandmother   . Hypertension Maternal Grandmother   . Diabetes Paternal Grandmother   . Hypertension Paternal Grandmother   . Stroke  Paternal Grandmother        mini strokes  . Ulcers Maternal Grandfather   . Lymphoma Cousin   . Hypertension Maternal Uncle   . Kidney failure Maternal Uncle   . Thyroid disease Paternal Aunt   . Multiple sclerosis Paternal Aunt   . COPD Neg Hx   . Breast cancer Neg Hx    Social History   Social History  . Marital status: Married    Spouse name: Grayland Ormond  . Number of children: 1  . Years of education: N/A   Occupational History  . Not on file.   Social History Main Topics  . Smoking status: Former Smoker    Packs/day: 1.50    Years: 20.00    Types: Cigarettes    Quit date: 09/10/2005  . Smokeless tobacco: Never Used     Comment: quit 7 years ago   . Alcohol use Yes     Comment: rarely  . Drug  use: No  . Sexual activity: Yes    Partners: Male    Birth control/ protection: Rhythm   Other Topics Concern  . Not on file   Social History Narrative  . No narrative on file    Interim medical history since last visit reviewed. Allergies and medications reviewed  Review of Systems Per HPI unless specifically indicated above     Objective:    BP 132/74 (BP Location: Left Arm, Patient Position: Sitting, Cuff Size: Large)   Pulse 97   Temp 98.3 F (36.8 C) (Oral)   Resp 16   Ht 5\' 7"  (1.702 m)   Wt 255 lb 1.6 oz (115.7 kg)   LMP 05/04/2017 (Exact Date)   SpO2 96%   BMI 39.95 kg/m   Wt Readings from Last 3 Encounters:  05/10/17 255 lb 1.6 oz (115.7 kg)  05/07/17 256 lb (116.1 kg)  01/31/17 247 lb 6.4 oz (112.2 kg)    Physical Exam  Constitutional: She appears well-developed and well-nourished.  Morbidly obese  HENT:  Mouth/Throat: Mucous membranes are normal.  Eyes: EOM are normal. No scleral icterus.  Cardiovascular: Normal rate and regular rhythm.   Pulmonary/Chest: Effort normal and breath sounds normal.  Abdominal: She exhibits no distension.  Musculoskeletal: She exhibits no edema.  Neurological: She is alert.  Psychiatric: She has a normal mood and affect. Her behavior is normal.   Diabetic Foot Form - Detailed   Diabetic Foot Exam - detailed Diabetic Foot exam was performed with the following findings:  Yes 05/10/2017  3:40 PM  Visual Foot Exam completed.:  Yes  Pulse Foot Exam completed.:  Yes  Right Dorsalis Pedis:  Present Left Dorsalis Pedis:  Present  Sensory Foot Exam Completed.:  Yes Semmes-Weinstein Monofilament Test R Site 1-Great Toe:  Pos L Site 1-Great Toe:  Pos          Assessment & Plan:   Problem List Items Addressed This Visit      Digestive   GERD (gastroesophageal reflux disease)    I do believe that significant weight loss would help her condition, as she pursues bariatric surgery        Endocrine   Type 2 diabetes  mellitus ([place])    Managed by endocrinologist        Other   Morbid obesity ([place]) - Primary    BMI > 35 with diabetes; explained diagnosis; I fully support her in her desire to pursue bariatric surgery; see AVS; she will see bariatric surgeon next week; see form  for appeal       Other Visit Diagnoses    Need for influenza vaccination       Relevant Orders   Flu Vaccine QUAD 6+ mos PF IM (Fluarix Quad PF) (Completed)       Follow up plan: Return in about 1 month (around 06/09/2017) for follow-up visit with Dr. Sanda Klein.  An after-visit summary was printed and given to the patient at [facility].  Please see the patient instructions which may contain other information and recommendations beyond what is mentioned above in the assessment and plan.  No orders of the defined types were placed in this encounter.   Orders Placed This Encounter  Procedures  . Flu Vaccine QUAD 6+ mos PF IM (Fluarix Quad PF)

## 2017-05-10 NOTE — Assessment & Plan Note (Signed)
BMI > 35 with diabetes; explained diagnosis; I fully support her in her desire to pursue bariatric surgery; see AVS; she will see bariatric surgeon next week; see form for appeal

## 2017-05-15 DIAGNOSIS — E1169 Type 2 diabetes mellitus with other specified complication: Secondary | ICD-10-CM | POA: Diagnosis not present

## 2017-05-16 DIAGNOSIS — E119 Type 2 diabetes mellitus without complications: Secondary | ICD-10-CM | POA: Diagnosis not present

## 2017-05-16 DIAGNOSIS — E039 Hypothyroidism, unspecified: Secondary | ICD-10-CM | POA: Diagnosis not present

## 2017-05-17 ENCOUNTER — Other Ambulatory Visit: Payer: Self-pay | Admitting: General Surgery

## 2017-05-22 DIAGNOSIS — E039 Hypothyroidism, unspecified: Secondary | ICD-10-CM | POA: Diagnosis not present

## 2017-05-22 DIAGNOSIS — E119 Type 2 diabetes mellitus without complications: Secondary | ICD-10-CM | POA: Diagnosis not present

## 2017-05-22 DIAGNOSIS — Z9641 Presence of insulin pump (external) (internal): Secondary | ICD-10-CM | POA: Diagnosis not present

## 2017-05-30 ENCOUNTER — Encounter: Payer: Self-pay | Admitting: Registered"

## 2017-05-30 ENCOUNTER — Ambulatory Visit
Admission: RE | Admit: 2017-05-30 | Discharge: 2017-05-30 | Disposition: A | Discharge status: Home / Self Care | Payer: 59 | Source: Ambulatory Visit | Attending: General Surgery | Admitting: General Surgery

## 2017-05-30 ENCOUNTER — Encounter: Payer: 59 | Attending: General Surgery | Admitting: Registered"

## 2017-05-30 DIAGNOSIS — Z79899 Other long term (current) drug therapy: Secondary | ICD-10-CM | POA: Diagnosis not present

## 2017-05-30 DIAGNOSIS — Z87891 Personal history of nicotine dependence: Secondary | ICD-10-CM | POA: Insufficient documentation

## 2017-05-30 DIAGNOSIS — Z794 Long term (current) use of insulin: Secondary | ICD-10-CM | POA: Insufficient documentation

## 2017-05-30 DIAGNOSIS — E7800 Pure hypercholesterolemia, unspecified: Secondary | ICD-10-CM | POA: Insufficient documentation

## 2017-05-30 DIAGNOSIS — Z8249 Family history of ischemic heart disease and other diseases of the circulatory system: Secondary | ICD-10-CM | POA: Insufficient documentation

## 2017-05-30 DIAGNOSIS — Z6839 Body mass index (BMI) 39.0-39.9, adult: Secondary | ICD-10-CM | POA: Diagnosis not present

## 2017-05-30 DIAGNOSIS — K5790 Diverticulosis of intestine, part unspecified, without perforation or abscess without bleeding: Secondary | ICD-10-CM | POA: Diagnosis not present

## 2017-05-30 DIAGNOSIS — Z833 Family history of diabetes mellitus: Secondary | ICD-10-CM | POA: Diagnosis not present

## 2017-05-30 DIAGNOSIS — E119 Type 2 diabetes mellitus without complications: Secondary | ICD-10-CM | POA: Diagnosis not present

## 2017-05-30 DIAGNOSIS — Z713 Dietary counseling and surveillance: Secondary | ICD-10-CM | POA: Diagnosis not present

## 2017-05-30 DIAGNOSIS — K219 Gastro-esophageal reflux disease without esophagitis: Secondary | ICD-10-CM | POA: Diagnosis not present

## 2017-05-30 DIAGNOSIS — Z8379 Family history of other diseases of the digestive system: Secondary | ICD-10-CM | POA: Insufficient documentation

## 2017-05-30 DIAGNOSIS — Z9884 Bariatric surgery status: Secondary | ICD-10-CM | POA: Diagnosis not present

## 2017-05-30 NOTE — Progress Notes (Signed)
Pre-Op Assessment Visit:  Pre-Operative RYGB Surgery  Medical Nutrition Therapy:  Appt start time: 2:05  End time:  3:05  Patient was seen on 05/30/2017 for Pre-Operative Nutrition Assessment. Assessment and letter of approval faxed to [facility] Bariatric Surgery Program coordinator on 05/30/2017.   Pt expectation of surgery: to no longer on insulin, to be healthy  Pt expectation of Dietitian: information what to eat/not to eat, guidance  Start weight at [facility]: 253.6 BMI: 39.72   Pt states she checks her BS 1-2x/day: FBS (130-215) and before meals. Pt states when she eats snacks at night   Per insurance, pt needs 6 SWL visits prior to surgery. Pt states she has already have done visits with PCP.    24 hr Dietary Recall: First Meal: sometimes skips; grits Snack: cheez-its Second Meal: [employer] mac, onion burger, fries Snack: none Third Meal: grits Snack: none Beverages: diet soda, coffee, water  Encouraged to engage in 75 minutes of moderate physical activity including cardiovascular and weight baring weekly  Handouts given during visit include:  . Pre-Op Goals . Bariatric Surgery Protein Shakes . Vitamin and Mineral Recommendations  During the appointment today the following Pre-Op Goals were reviewed with the patient: . Maintain or lose weight as instructed by your surgeon . Make healthy food choices . Begin to limit portion sizes . Limited concentrated sugars and fried foods . Keep fat/sugar in the single digits per serving on food labels . Practice CHEWING your food  (aim for 30 chews per bite or until applesauce consistency) . Practice not drinking 15 minutes before, during, and 30 minutes after each meal/snack . Avoid all carbonated beverages  . Avoid/limit caffeinated beverages  . Avoid all sugar-sweetened beverages . Consume 3 meals per day; eat every 3-5 hours . Make a list of non-food related activities . Aim for 64-100 ounces of FLUID daily   . Aim for at least 60-80 grams of PROTEIN daily . Look for a liquid protein source that contain ?15 g protein and ?5 g carbohydrate  (ex: shakes, drinks, shots) . Physical activity is an important part of a healthy lifestyle so keep it moving!  Follow diet recommendations listed below Energy and Macronutrient Recommendations: Calories: 1800 Carbohydrate: 200 Protein: 135 Fat: 50  Demonstrated degree of understanding via:  Teach Back   Teaching Method Utilized:  Visual Auditory Hands on  Barriers to learning/adherence to lifestyle change: none  Patient to call the Nutrition and Diabetes Education Services to enroll in Pre-Op and Post-Op Nutrition Education when surgery date is scheduled.

## 2017-06-04 ENCOUNTER — Encounter: Payer: Self-pay | Admitting: Family Medicine

## 2017-06-04 ENCOUNTER — Ambulatory Visit
Admission: RE | Admit: 2017-06-04 | Discharge: 2017-06-04 | Disposition: A | Discharge status: Home / Self Care | Payer: 59 | Source: Ambulatory Visit | Attending: Family Medicine | Admitting: Family Medicine

## 2017-06-04 ENCOUNTER — Other Ambulatory Visit: Payer: Self-pay | Admitting: Family Medicine

## 2017-06-04 ENCOUNTER — Ambulatory Visit (INDEPENDENT_AMBULATORY_CARE_PROVIDER_SITE_OTHER): Payer: 59 | Admitting: Family Medicine

## 2017-06-04 VITALS — BP 130/80 | HR 89 | Temp 98.1°F | Resp 16 | Ht 67.0 in | Wt 247.7 lb

## 2017-06-04 DIAGNOSIS — J45901 Unspecified asthma with (acute) exacerbation: Secondary | ICD-10-CM

## 2017-06-04 DIAGNOSIS — R0689 Other abnormalities of breathing: Secondary | ICD-10-CM | POA: Diagnosis not present

## 2017-06-04 DIAGNOSIS — J181 Lobar pneumonia, unspecified organism: Principal | ICD-10-CM

## 2017-06-04 DIAGNOSIS — R0789 Other chest pain: Secondary | ICD-10-CM

## 2017-06-04 DIAGNOSIS — J9811 Atelectasis: Secondary | ICD-10-CM | POA: Diagnosis not present

## 2017-06-04 DIAGNOSIS — E119 Type 2 diabetes mellitus without complications: Secondary | ICD-10-CM | POA: Diagnosis not present

## 2017-06-04 DIAGNOSIS — J189 Pneumonia, unspecified organism: Secondary | ICD-10-CM

## 2017-06-04 DIAGNOSIS — R0602 Shortness of breath: Secondary | ICD-10-CM | POA: Diagnosis not present

## 2017-06-04 LAB — SPECIMEN STATUS REPORT

## 2017-06-04 MED ORDER — ALBUTEROL SULFATE (2.5 MG/3ML) 0.083% IN NEBU: 2.5000 mg | INHALATION_SOLUTION | Freq: Once | RESPIRATORY_TRACT | Status: DC

## 2017-06-04 MED ORDER — LEVOFLOXACIN 500 MG PO TABS: 500.0000 mg | ORAL_TABLET | Freq: Every day | ORAL | 0 refills | Status: DC

## 2017-06-04 MED ORDER — PREDNISONE 10 MG PO TABS: 10.0000 mg | ORAL_TABLET | Freq: Every day | ORAL | 0 refills | Status: DC

## 2017-06-04 NOTE — Progress Notes (Signed)
Name: Amber Stanley   MRN: [id number]    DOB: June 10, 1971   Date:06/04/2017       Progress Note  Subjective  Chief Complaint  Chief Complaint  Patient presents with  . Asthma    HPI  Pt presents with complaint of asthma exacerbation. She has been using her albuterol inhaler Q4H for 2 days.  She also takes Advair daily. She has had an upper respiratory infection for about a week - sinus pressure, nasal congestion at night only.  Over the last two days she has had progressively worsening shortness of breath, chest tightness and moderate chest pressure; also endorses some subjective fevers and chills.  No jaw/arm/back pain; denies NVD or abdominal pain, no urinary symptoms.  She had a routine chest Xray on 05/30/2017, but was feeling well that day; ECG also performed that day and showed NSR.  Recent lipid panel showed hypertriglyceridemia; she does not take a statin.  Pt has T2DM, has taken prednisone in the past and done well on it; checks BG's at home and they have been running in the 130's-140's.  Patient Active Problem List   Diagnosis Date Noted  . First degree hemorrhoids   . Diverticulosis of large intestine without diverticulitis   . Iron deficiency anemia 08/15/2016  . Hx of microcytic hypochromic anemia 08/10/2016  . Need for pneumococcal vaccination 08/10/2016  . Closed fracture of right wrist 04/17/2016  . Fatigue 01/19/2016  . Preventative health care 01/19/2016  . Morbid obesity ([place]) 08/26/2015  . Hypothyroidism 07/03/2011  . High cholesterol 07/03/2011  . Type 2 diabetes mellitus ([place]) 07/03/2011  . GERD (gastroesophageal reflux disease) 07/03/2011  . History of asthma 07/03/2011  . PCOS (polycystic ovarian syndrome) 07/03/2011    Social History  Substance Use Topics  . Smoking status: Former Smoker    Packs/day: 1.50    Years: 20.00    Types: Cigarettes    Quit date: 09/10/2005  . Smokeless tobacco: Never Used     Comment: quit 7 years ago   . Alcohol use Yes      Comment: rarely     Current Outpatient Prescriptions:  .  ADVAIR DISKUS 100-50 MCG/DOSE AEPB, INHALE 1 PUFF BY MOUTH  TWICE DAILY, Disp: 180 each, Rfl: 3 .  albuterol (PROAIR HFA) 108 (90 Base) MCG/ACT inhaler, Inhale 2 puffs into the lungs every 4 (four) hours as needed for wheezing or shortness of breath., Disp: 3 Inhaler, Rfl: 0 .  cyanocobalamin (V-R VITAMIN B-12) 500 MCG tablet, Take 1,000 mg by mouth daily., Disp: , Rfl:  .  HUMALOG 100 UNIT/ML injection, 100 Units. pump, Disp: , Rfl:  .  Insulin Infusion Pump Supplies (PARADIGM RESERVOIR 3ML) MISC, , Disp: , Rfl:  .  Insulin Pen Needle (FIFTY50 PEN NEEDLES) 32G X 4 MM MISC, Inject into the skin., Disp: , Rfl:  .  iron polysaccharides (NU-IRON) 150 MG capsule, Take 1 capsule (150 mg total) by mouth daily., Disp: 30 capsule, Rfl: 1 .  levothyroxine (SYNTHROID, LEVOTHROID) 75 MCG tablet, TAKE 1 TABLET BY MOUTH ONCE DAILY ON AN EMPTY STOMACH  WITH A GLASS OF WATER 30-60 MINUTES BEFORE BREAKFAST, Disp: , Rfl:  .  metFORMIN (GLUCOPHAGE) 1000 MG tablet, TAKE 1 TABLET BY MOUTH TWO  TIMES DAILY WITH MEALS, Disp: , Rfl:  .  metoprolol succinate (TOPROL-XL) 50 MG 24 hr tablet, TAKE 1 TABLET BY MOUTH  DAILY, Disp: 90 tablet, Rfl: 3 .  montelukast (SINGULAIR) 10 MG tablet, TAKE 1 TABLET BY MOUTH  DAILY, Disp: 90 tablet, Rfl: 3 .  omeprazole (PRILOSEC) 20 MG capsule, Take 20 mg by mouth daily., Disp: , Rfl:  .  ONE TOUCH ULTRA TEST test strip, , Disp: , Rfl:  .  VICTOZA 18 MG/3ML SOPN, , Disp: , Rfl:  .  Vitamin D, Ergocalciferol, (DRISDOL) 50000 units CAPS capsule, Take 50,000 Units by mouth 2 (two) times a week., Disp: , Rfl:  .  liraglutide (VICTOZA) 18 MG/3ML SOPN, Inject 1.8 mg into the skin daily. , Disp: , Rfl:   Allergies  Allergen Reactions  . Adhesive [Tape] Itching    Skin peels off     ROS  Ten systems reviewed and is negative except as mentioned in HPI  Objective  Vitals:   06/04/17 0906  BP: 130/80  Pulse: 89  Resp:  16  Temp: 98.1 F (36.7 C)  TempSrc: Oral  SpO2: 96%  Weight: 247 lb 11.2 oz (112.4 kg)  Height: _0  (1.702 m)   Body mass index is 38.8 kg/m.  Nursing Note and Vital Signs reviewed.  Physical Exam  Constitutional: Patient appears well-developed and well-nourished. Obese. No distress.  HEENT: head atraumatic, normocephalic, pupils equal and reactive to light, EOM's intact, TM's without erythema or bulging, no maxillary or frontal sinus pain on palpation, neck supple without lymphadenopathy, oropharynx pink and moist without exudate Cardiovascular: Tachycardic rate, regular rhythm, S1/S2 present.  No murmur or rub heard. No BLE edema. Pulmonary/Chest: Effort normal - RR approximately 20-24 and regular; and breath sounds diminished throughout with no wheezes, crackles, or rhonchi. No respiratory distress or retractions are evident at this time.  Dry cough intermittent during examination. Psychiatric: Patient has a normal mood and affect. behavior is normal. Judgment and thought content normal.  Recent Results (from the past 2160 hour(s))  IGP,rfx Aptima HPV all pth     Status: None   Collection Time: 05/07/17  9:41 AM  Result Value Ref Range   DIAGNOSIS: Comment     Comment: NEGATIVE FOR INTRAEPITHELIAL LESION AND MALIGNANCY.   Specimen adequacy: Comment     Comment: Satisfactory for evaluation. Endocervical and/or squamous metaplastic cells (endocervical component) are present.    Clinician Provided ICD10 Comment     Comment: Z01.419 Z12.4    Performed by: Comment     Comment: Collene Schlichter, Cytotechnologist (ASCP)   PAP Smear Comment .    Note: Comment     Comment: The Pap smear is a screening test designed to aid in the detection of premalignant and malignant conditions of the uterine cervix.  It is not a diagnostic procedure and should not be used as the sole means of detecting cervical cancer.  Both false-positive and false-negative reports do occur.    Test  Methodology Comment     Comment: This liquid based ThinPrep(R) pap test was screened with the use of an image guided system.    PAP Reflex Comment     Comment: The HPV DNA reflex criteria were not met with this specimen result therefore, no HPV testing was performed.      Assessment & Plan  1. Chest tightness or pressure - EKG 12-Lead -- sinus tachycardia; aside from rate, ECG appears unremarkable when compared to her ECG on 05/30/2017. - CBC w/Diff/Platelet - Troponin I - DG Chest 2 View; Future - Strongly advised patient to go to ER for further evaluation of chest pressure; she refuses to go due to finances, but is willing to have stat labs performed today along with ECG and  chest Xray. If any indication of cardiac issues or worsening respiratory function, pt agrees to present for emergency care.  2. Moderate asthma with acute exacerbation, unspecified whether persistent - predniSONE (DELTASONE) 10 MG tablet; Take 1 tablet (10 mg total) by mouth daily with breakfast. Day1:5tabs, Day2:4tabs, Day3:3tabs, Day4:2, Day5:1tab  Dispense: 15 tablet; Refill: 0 - Comprehensive metabolic panel - CBC w/Diff/Platelet - Troponin I - Advised we will await chest Xray and labs to determine if antibiotics are needed.   - Pt will check fasting BG daily, and if >200, she will call office for additional instructions.  She is instructed to drink plenty of fluids and follow a diabetic diet. - Nebulizer treatment is discontinued due to patient's tachycardia prior to treatment at 122bpm and sinus rhythm. Pt is agreeable to this plan.  Also advised to space Albuterol inhaler use to Q5-6 hours unless needed for shortness of breath.  3. Decreased breath sounds - DG Chest 2 View; Future - CBC w/Diff/Platelet - Troponin I  -Red flags and when to present for emergency care or RTC including fever >101.61F, chest pain/worsening chest tightness/pressure/shortness of breath, new/worsening/un-resolving symptoms,  reviewed with patient at time of visit. Follow up and care instructions discussed and provided in AVS.  Plan of care is discussed with PCP, Dr. Sanda Klein, who also agrees patient should present for emergency care.  However she is agreeable to this alternative plan of care as the patient adamantly refuses emergency care.

## 2017-06-04 NOTE — Patient Instructions (Addendum)
If FASTING blood sugar goes above 200, please call our clinic for additional instruction.  Drink plenty of water, eat healthy with low carbohydrate, low sugar foods.  Activity only as tolerated. Asthma, Adult Asthma is a condition of the lungs in which the airways tighten and narrow. Asthma can make it hard to breathe. Asthma cannot be cured, but medicine and lifestyle changes can help control it. Asthma may be started (triggered) by:  Animal skin flakes (dander).  Dust.  Cockroaches.  Pollen.  Mold.  Smoke.  Cleaning products.  Hair sprays or aerosol sprays.  Paint fumes or strong smells.  Cold air, weather changes, and winds.  Crying or laughing hard.  Stress.  Certain medicines or drugs.  Foods, such as dried fruit, potato chips, and sparkling grape juice.  Infections or conditions (colds, flu).  Exercise.  Certain medical conditions or diseases.  Exercise or tiring activities.  Follow these instructions at home:  Take medicine as told by your doctor.  Use a peak flow meter as told by your doctor. A peak flow meter is a tool that measures how well the lungs are working.  Record and keep track of the peak flow meter's readings.  Understand and use the asthma action plan. An asthma action plan is a written plan for taking care of your asthma and treating your attacks.  To help prevent asthma attacks: ? Do not smoke. Stay away from secondhand smoke. ? Change your heating and air conditioning filter often. ? Limit your use of fireplaces and wood stoves. ? Get rid of pests (such as roaches and mice) and their droppings. ? Throw away plants if you see mold on them. ? Clean your floors. Dust regularly. Use cleaning products that do not smell. ? Have someone vacuum when you are not home. Use a vacuum cleaner with a HEPA filter if possible. ? Replace carpet with wood, tile, or vinyl flooring. Carpet can trap animal skin flakes and dust. ? Use allergy-proof  pillows, mattress covers, and box spring covers. ? Wash bed sheets and blankets every week in hot water and dry them in a dryer. ? Use blankets that are made of polyester or cotton. ? Clean bathrooms and kitchens with bleach. If possible, have someone repaint the walls in these rooms with mold-resistant paint. Keep out of the rooms that are being cleaned and painted. ? Wash hands often. Contact a doctor if:  You have make a whistling sound when breaking (wheeze), have shortness of breath, or have a cough even if taking medicine to prevent attacks.  The colored mucus you cough up (sputum) is thicker than usual.  The colored mucus you cough up changes from clear or white to yellow, green, gray, or bloody.  You have problems from the medicine you are taking such as: ? A rash. ? Itching. ? Swelling. ? Trouble breathing.  You need reliever medicines more than 2-3 times a week.  Your peak flow measurement is still at 50-79% of your personal best after following the action plan for 1 hour.  You have a fever. Get help right away if:  You seem to be worse and are not responding to medicine during an asthma attack.  You are short of breath even at rest.  You get short of breath when doing very little activity.  You have trouble eating, drinking, or talking.  You have chest pain.  You have a fast heartbeat.  Your lips or fingernails start to turn blue.  You are light-headed,  dizzy, or faint.  Your peak flow is less than 50% of your personal best. This information is not intended to replace advice given to you by your health care provider. Make sure you discuss any questions you have with your health care provider. Document Released: 01/24/2008 Document Revised: 01/13/2016 Document Reviewed: 03/06/2013 Elsevier Interactive Patient Education  2017 [employer].

## 2017-06-05 ENCOUNTER — Other Ambulatory Visit: Payer: Self-pay | Admitting: Family Medicine

## 2017-06-05 ENCOUNTER — Ambulatory Visit (INDEPENDENT_AMBULATORY_CARE_PROVIDER_SITE_OTHER): Payer: 59 | Admitting: Family Medicine

## 2017-06-05 ENCOUNTER — Encounter: Payer: Self-pay | Admitting: Family Medicine

## 2017-06-05 VITALS — BP 120/68 | HR 114 | Temp 98.5°F | Resp 16 | Ht 67.0 in | Wt 245.3 lb

## 2017-06-05 DIAGNOSIS — R0602 Shortness of breath: Secondary | ICD-10-CM | POA: Diagnosis not present

## 2017-06-05 DIAGNOSIS — J181 Lobar pneumonia, unspecified organism: Principal | ICD-10-CM

## 2017-06-05 DIAGNOSIS — E119 Type 2 diabetes mellitus without complications: Secondary | ICD-10-CM

## 2017-06-05 DIAGNOSIS — J189 Pneumonia, unspecified organism: Secondary | ICD-10-CM

## 2017-06-05 MED ORDER — ALBUTEROL SULFATE HFA 108 (90 BASE) MCG/ACT IN AERS: 2.0000 | INHALATION_SPRAY | RESPIRATORY_TRACT | 0 refills | Status: AC | PRN

## 2017-06-05 NOTE — Patient Instructions (Addendum)

## 2017-06-05 NOTE — Progress Notes (Addendum)
Name: Amber Stanley   MRN: [id number]    DOB: May 25, 1971   Date:06/05/2017       Progress Note  Subjective  Chief Complaint  Chief Complaint  Patient presents with  . Follow-up    HPI  Pt presents for PNA follow up - right middle lobe density found yesterday on Xray.  She started Levaquin and prednisone and is feeling much better today.  Heart rate is down to 114bpm today, and she is no longer having chest pain or pressure.  Still having mild to moderate SOB with exertion.  Denies fevers/chills. BG today was 180 fasting.  If above 200, she will call our clinic for further instruction.  She is having some diarrhea likely related to ABX - discussed probiotics and yogurt with probiotic - she will try this.  Declines nausea medication today but will call if needed.  Has only used albuterol once today and has not felt that she needs it more than that - she does need a refill today.  Patient Active Problem List   Diagnosis Date Noted  . First degree hemorrhoids   . Diverticulosis of large intestine without diverticulitis   . Iron deficiency anemia 08/15/2016  . Hx of microcytic hypochromic anemia 08/10/2016  . Need for pneumococcal vaccination 08/10/2016  . Closed fracture of right wrist 04/17/2016  . Fatigue 01/19/2016  . Preventative health care 01/19/2016  . Morbid obesity ([place]) 08/26/2015  . Hypothyroidism 07/03/2011  . High cholesterol 07/03/2011  . Type 2 diabetes mellitus ([place]) 07/03/2011  . GERD (gastroesophageal reflux disease) 07/03/2011  . History of asthma 07/03/2011  . PCOS (polycystic ovarian syndrome) 07/03/2011    Social History  Substance Use Topics  . Smoking status: Former Smoker    Packs/day: 1.50    Years: 20.00    Types: Cigarettes    Quit date: 09/10/2005  . Smokeless tobacco: Never Used     Comment: quit 7 years ago   . Alcohol use Yes     Comment: rarely     Current Outpatient Prescriptions:  .  ADVAIR DISKUS 100-50 MCG/DOSE AEPB, INHALE 1 PUFF BY  MOUTH  TWICE DAILY, Disp: 180 each, Rfl: 3 .  albuterol (PROAIR HFA) 108 (90 Base) MCG/ACT inhaler, Inhale 2 puffs into the lungs every 4 (four) hours as needed for wheezing or shortness of breath., Disp: 3 Inhaler, Rfl: 0 .  cyanocobalamin (V-R VITAMIN B-12) 500 MCG tablet, Take 1,000 mg by mouth daily., Disp: , Rfl:  .  HUMALOG 100 UNIT/ML injection, 100 Units. pump, Disp: , Rfl:  .  Insulin Infusion Pump Supplies (PARADIGM RESERVOIR 3ML) MISC, , Disp: , Rfl:  .  Insulin Pen Needle (FIFTY50 PEN NEEDLES) 32G X 4 MM MISC, Inject into the skin., Disp: , Rfl:  .  iron polysaccharides (NU-IRON) 150 MG capsule, Take 1 capsule (150 mg total) by mouth daily., Disp: 30 capsule, Rfl: 1 .  levofloxacin (LEVAQUIN) 500 MG tablet, Take 1 tablet (500 mg total) by mouth daily., Disp: 7 tablet, Rfl: 0 .  levothyroxine (SYNTHROID, LEVOTHROID) 75 MCG tablet, TAKE 1 TABLET BY MOUTH ONCE DAILY ON AN EMPTY STOMACH  WITH A GLASS OF WATER 30-60 MINUTES BEFORE BREAKFAST, Disp: , Rfl:  .  metFORMIN (GLUCOPHAGE) 1000 MG tablet, TAKE 1 TABLET BY MOUTH TWO  TIMES DAILY WITH MEALS, Disp: , Rfl:  .  metoprolol succinate (TOPROL-XL) 50 MG 24 hr tablet, TAKE 1 TABLET BY MOUTH  DAILY, Disp: 90 tablet, Rfl: 3 .  montelukast (SINGULAIR) 10  MG tablet, TAKE 1 TABLET BY MOUTH  DAILY, Disp: 90 tablet, Rfl: 3 .  omeprazole (PRILOSEC) 20 MG capsule, Take 20 mg by mouth daily., Disp: , Rfl:  .  ONE TOUCH ULTRA TEST test strip, , Disp: , Rfl:  .  predniSONE (DELTASONE) 10 MG tablet, Take 1 tablet (10 mg total) by mouth daily with breakfast. Day1:5tabs, Day2:4tabs, Day3:3tabs, Day4:2, Day5:1tab, Disp: 15 tablet, Rfl: 0 .  VICTOZA 18 MG/3ML SOPN, , Disp: , Rfl:  .  Vitamin D, Ergocalciferol, (DRISDOL) 50000 units CAPS capsule, Take 50,000 Units by mouth 2 (two) times a week., Disp: , Rfl:  .  liraglutide (VICTOZA) 18 MG/3ML SOPN, Inject 1.8 mg into the skin daily. , Disp: , Rfl:   Allergies  Allergen Reactions  . Adhesive [Tape] Itching     Skin peels off     ROS  Constitutional: Negative for fever or weight change.  Respiratory: Negative for cough and shortness of breath.   Cardiovascular: Negative for chest pain or palpitations.  Gastrointestinal: Negative for abdominal pain, no bowel changes.  Musculoskeletal: Negative for gait problem or joint swelling.  Skin: Negative for rash.  Neurological: Negative for dizziness or headache.  No other specific complaints in a complete review of systems (except as listed in HPI above).  Objective  Vitals:   06/05/17 0949  BP: 120/68  Pulse: (!) 114  Resp: 16  Temp: 98.5 F (36.9 C)  TempSrc: Oral  SpO2: 96%  Weight: 245 lb 4.8 oz (111.3 kg)  Height: _0  (1.702 m)   Body mass index is 38.42 kg/m.  Nursing Note and Vital Signs reviewed.  HR decreased from yesterday's visit, secondary to illness.  Using less albuterol today than the last several days.  Physical Exam Constitutional: Patient appears well-developed and well-nourished. Obese No distress.  HEENT: head atraumatic, normocephalic Cardiovascular: Normal rate, regular rhythm, S1/S2 present.  No murmur or rub heard. No BLE edema. Pulmonary/Chest: Effort normal and breath sounds are slightly diminished throughout with mild expiratory wheeze in the RUL. No respiratory distress or retractions. Psychiatric: Patient has a normal mood and affect. behavior is normal. Judgment and thought content normal.  Recent Results (from the past 2160 hour(s))  IGP,rfx Aptima HPV all pth     Status: None   Collection Time: 05/07/17  9:41 AM  Result Value Ref Range   DIAGNOSIS: Comment     Comment: NEGATIVE FOR INTRAEPITHELIAL LESION AND MALIGNANCY.   Specimen adequacy: Comment     Comment: Satisfactory for evaluation. Endocervical and/or squamous metaplastic cells (endocervical component) are present.    Clinician Provided ICD10 Comment     Comment: Z01.419 Z12.4    Performed by: Comment     Comment: Collene Schlichter,  Cytotechnologist (ASCP)   PAP Smear Comment .    Note: Comment     Comment: The Pap smear is a screening test designed to aid in the detection of premalignant and malignant conditions of the uterine cervix.  It is not a diagnostic procedure and should not be used as the sole means of detecting cervical cancer.  Both false-positive and false-negative reports do occur.    Test Methodology Comment     Comment: This liquid based ThinPrep(R) pap test was screened with the use of an image guided system.    PAP Reflex Comment     Comment: The HPV DNA reflex criteria were not met with this specimen result therefore, no HPV testing was performed.   Comprehensive metabolic panel  Status: Abnormal   Collection Time: 06/04/17 10:00 AM  Result Value Ref Range   Glucose 148 (H) 65 - 99 mg/dL   BUN 4 (L) 6 - 24 mg/dL   Creatinine, Ser 0.75 0.57 - 1.00 mg/dL   GFR calc non Af Amer 96 >59 mL/min/1.73   GFR calc Af Amer 111 >59 mL/min/1.73   BUN/Creatinine Ratio 5 (L) 9 - 23   Sodium 137 134 - 144 mmol/L   Potassium 4.1 3.5 - 5.2 mmol/L   Chloride 98 96 - 106 mmol/L   CO2 21 20 - 29 mmol/L   Calcium 9.1 8.7 - 10.2 mg/dL   Total Protein 7.3 6.0 - 8.5 g/dL   Albumin 4.4 3.5 - 5.5 g/dL   Globulin, Total 2.9 1.5 - 4.5 g/dL   Albumin/Globulin Ratio 1.5 1.2 - 2.2   Bilirubin Total 0.4 0.0 - 1.2 mg/dL   Alkaline Phosphatase 67 39 - 117 IU/L   AST 18 0 - 40 IU/L   ALT 21 0 - 32 IU/L  CBC w/Diff/Platelet     Status: None   Collection Time: 06/04/17 10:00 AM  Result Value Ref Range   WBC 7.6 3.4 - 10.8 x10E3/uL   RBC 4.74 3.77 - 5.28 x10E6/uL   Hemoglobin 12.7 11.1 - 15.9 g/dL   Hematocrit 37.7 34.0 - 46.6 %   MCV 80 79 - 97 fL   MCH 26.8 26.6 - 33.0 pg   MCHC 33.7 31.5 - 35.7 g/dL   RDW 14.8 12.3 - 15.4 %   Platelets 276 150 - 379 x10E3/uL   Neutrophils 78 Not Estab. %   Lymphs 15 Not Estab. %   Monocytes 7 Not Estab. %   Eos 0 Not Estab. %   Basos 0 Not Estab. %   Neutrophils Absolute  5.9 1.4 - 7.0 x10E3/uL   Lymphocytes Absolute 1.2 0.7 - 3.1 x10E3/uL   Monocytes Absolute 0.6 0.1 - 0.9 x10E3/uL   EOS (ABSOLUTE) 0.0 0.0 - 0.4 x10E3/uL   Basophils Absolute 0.0 0.0 - 0.2 x10E3/uL   Immature Granulocytes 0 Not Estab. %   Immature Grans (Abs) 0.0 0.0 - 0.1 x10E3/uL  Troponin I     Status: None   Collection Time: 06/04/17 10:00 AM  Result Value Ref Range   Troponin I <0.01 0.00 - 0.04 ng/mL  Specimen status report     Status: None (Preliminary result)   Collection Time: 06/04/17 10:00 AM  Result Value Ref Range   specimen status report Comment     Comment: NTI Plasma     Assessment & Plan  1. Pneumonia of right middle lobe due to infectious organism ([place]) - albuterol (PROAIR HFA) 108 (90 Base) MCG/ACT inhaler; Inhale 2 puffs into the lungs every 4 (four) hours as needed for wheezing or shortness of breath.  Dispense: 1 Inhaler; Refill: 0 - Future order to be placed for Chest Xray to follow up on PNA in 4 weeks.  2. Type 2 diabetes mellitus without complication, without long-term current use of insulin ([place]) - Continuing checking BG's - if greater than 200, will call clinic for additional details.  3. Shortness of breath - albuterol (PROAIR HFA) 108 (90 Base) MCG/ACT inhaler; Inhale 2 puffs into the lungs every 4 (four) hours as needed for wheezing or shortness of breath.  Dispense: 1 Inhaler; Refill: 0  -Red flags and when to present for emergency care or RTC including fever >101.54F, chest pain, shortness of breath, new/worsening/un-resolving symptoms, reviewed with patient at time  of visit. Follow up and care instructions discussed and provided in AVS.

## 2017-06-08 ENCOUNTER — Ambulatory Visit (INDEPENDENT_AMBULATORY_CARE_PROVIDER_SITE_OTHER): Payer: 59 | Admitting: Family Medicine

## 2017-06-08 ENCOUNTER — Encounter: Payer: Self-pay | Admitting: Family Medicine

## 2017-06-08 VITALS — BP 134/74 | HR 98 | Ht 67.0 in | Wt 252.4 lb

## 2017-06-08 DIAGNOSIS — J181 Lobar pneumonia, unspecified organism: Secondary | ICD-10-CM

## 2017-06-08 DIAGNOSIS — K219 Gastro-esophageal reflux disease without esophagitis: Secondary | ICD-10-CM

## 2017-06-08 DIAGNOSIS — E7800 Pure hypercholesterolemia, unspecified: Secondary | ICD-10-CM

## 2017-06-08 DIAGNOSIS — E034 Atrophy of thyroid (acquired): Secondary | ICD-10-CM

## 2017-06-08 DIAGNOSIS — J189 Pneumonia, unspecified organism: Secondary | ICD-10-CM

## 2017-06-08 DIAGNOSIS — E119 Type 2 diabetes mellitus without complications: Secondary | ICD-10-CM

## 2017-06-08 LAB — CBC WITH DIFFERENTIAL/PLATELET
BASOS ABS: 0 10*3/uL (ref 0.0–0.2)
BASOS: 0 %
EOS (ABSOLUTE): 0 10*3/uL (ref 0.0–0.4)
EOS: 0 %
HEMOGLOBIN: 12.7 g/dL (ref 11.1–15.9)
Hematocrit: 37.7 % (ref 34.0–46.6)
IMMATURE GRANS (ABS): 0 10*3/uL (ref 0.0–0.1)
Immature Granulocytes: 0 %
LYMPHS: 15 %
Lymphocytes Absolute: 1.2 10*3/uL (ref 0.7–3.1)
MCH: 26.8 pg (ref 26.6–33.0)
MCHC: 33.7 g/dL (ref 31.5–35.7)
MCV: 80 fL (ref 79–97)
MONOCYTES: 7 %
Monocytes Absolute: 0.6 10*3/uL (ref 0.1–0.9)
NEUTROS ABS: 5.9 10*3/uL (ref 1.4–7.0)
Neutrophils: 78 %
Platelets: 276 10*3/uL (ref 150–379)
RBC: 4.74 x10E6/uL (ref 3.77–5.28)
RDW: 14.8 % (ref 12.3–15.4)
WBC: 7.6 10*3/uL (ref 3.4–10.8)

## 2017-06-08 LAB — COMPREHENSIVE METABOLIC PANEL
ALT: 21 IU/L (ref 0–32)
AST: 18 IU/L (ref 0–40)
Albumin/Globulin Ratio: 1.5 (ref 1.2–2.2)
Albumin: 4.4 g/dL (ref 3.5–5.5)
Alkaline Phosphatase: 67 IU/L (ref 39–117)
BUN / CREAT RATIO: 5 — AB (ref 9–23)
BUN: 4 mg/dL — AB (ref 6–24)
Bilirubin Total: 0.4 mg/dL (ref 0.0–1.2)
CALCIUM: 9.1 mg/dL (ref 8.7–10.2)
CO2: 21 mmol/L (ref 20–29)
CREATININE: 0.75 mg/dL (ref 0.57–1.00)
Chloride: 98 mmol/L (ref 96–106)
GFR calc non Af Amer: 96 mL/min/{1.73_m2} (ref >59)
GFR, EST AFRICAN AMERICAN: 111 mL/min/{1.73_m2} (ref >59)
GLUCOSE: 148 mg/dL — AB (ref 65–99)
Globulin, Total: 2.9 g/dL (ref 1.5–4.5)
Potassium: 4.1 mmol/L (ref 3.5–5.2)
Sodium: 137 mmol/L (ref 134–144)
TOTAL PROTEIN: 7.3 g/dL (ref 6.0–8.5)

## 2017-06-08 LAB — TROPONIN I

## 2017-06-08 NOTE — Patient Instructions (Addendum)
Please do have a follow-up chest xray done the week of November 5-12 and we'll make sure the pneumonia is gone You are going do great!

## 2017-06-08 NOTE — Assessment & Plan Note (Signed)
I sincerely believe that significant weight loss will improve this condition and prevent or at least delay any complications from diabetes including heart attack and stroke

## 2017-06-08 NOTE — Assessment & Plan Note (Addendum)
TSH already checked by surgeon per patient

## 2017-06-08 NOTE — Progress Notes (Signed)
BP 134/74 (BP Location: Left Arm, Patient Position: Sitting, Cuff Size: Large)   Pulse 98   Ht 5\' 7"  (1.702 m)   Wt 252 lb 6.4 oz (114.5 kg)   LMP 05/25/2017   BMI 39.53 kg/m    Subjective:    Patient ID: Amber Stanley, female    DOB: 11/30/1970, 46 y.o.   MRN: [id number]  HPI: Amber Stanley is a 46 y.o. female  Chief Complaint  Patient presents with  . Weight Check    Planning weight loss surgery     HPI Patient is here for scheduled obesity discussion, but since I've seen her, she actually developed pneumonia (RML by CXR) Treated with antibiotics, still taking levaquin She was having trouble breathing, not really trouble she clarified, more like breath wasn't going all the way in Had productive cough after starting medicine, but not before then Feeling much better Using albuterol That was not first round of pneumonia Already had pneumonia vaccine PCV-13 in 2017 and thinks had the PPSV-23 in 2012 at [facility] she might have picked it up in the hospital when she went for upper GI (?)  She has obesity and is planning for bariatric surgery; see the forms brought in today See the letter for additional information and documentation including co-morbidities and attempts tried; co-mordibities include type 2 diabetes, GERD She reports knee pain, but not sure if arthritis She was over 200 pounds even when she graduated from high school; she weighed 216 pounds when she got married in 1993, and had lost weight for the wedding even Both mother and father are heavy-set She will go to the presurgery class and sleep study; already saw psychologist  Depression screen [facility] 2/9 06/08/2017 05/30/2017 05/10/2017 11/21/2016 08/10/2016  Decreased Interest 0 0 0 0 0  Down, Depressed, Hopeless 0 0 0 0 0  PHQ - 2 Score 0 0 0 0 0   Relevant past medical, surgical, family and social history reviewed Past Medical History:  Diagnosis Date  . Anemia   . Asthma   . Diabetes mellitus  without complication ([place])   . Diverticulosis 08/2016  . Hypercholesterolemia 07/03/2011  . Hypertension   . Morbid obesity ([place]) 08/26/2015  . Polycystic ovaries   . Reflux   . Thyroid disease    Past Surgical History:  Procedure Laterality Date  . BILATERAL CARPAL TUNNEL RELEASE  2007  . CESAREAN SECTION  10/09/2010   FTP after IOL then had a PPH at [facility]  . CHOLECYSTECTOMY  1999  . COLONOSCOPY WITH PROPOFOL N/A 09/05/2016   Procedure: COLONOSCOPY WITH PROPOFOL;  Surgeon: Jonathon Bellows, MD;  Location: [facility] ENDOSCOPY;  Service: Endoscopy;  Laterality: N/A;. Diverticulosis  . DILATION AND CURETTAGE OF UTERUS  08/18/2003   endometrial polyps and hyperplasia  . ESOPHAGOGASTRODUODENOSCOPY (EGD) WITH PROPOFOL N/A 09/05/2016   Procedure: ESOPHAGOGASTRODUODENOSCOPY (EGD) WITH PROPOFOL;  Surgeon: Jonathon Bellows, MD;  Location: [facility] ENDOSCOPY;  Service: Endoscopy;  Laterality: N/A;  . SESMOIDECTOMY Left    fractured sesamoid bone removed from foot   Family History  Problem Relation Age of Onset  . Diabetes Mother   . Hypertension Mother   . Heart attack Mother 84       died from MI  . Heart disease Sister 42  . Cancer Brother 60       leukemia  . Stroke Maternal Grandmother   . Hypertension Maternal Grandmother   . Diabetes Paternal Grandmother   . Hypertension Paternal Grandmother   . Stroke  Paternal Grandmother        mini strokes  . Ulcers Maternal Grandfather   . Lymphoma Cousin   . Hypertension Maternal Uncle   . Kidney failure Maternal Uncle   . Thyroid disease Paternal Aunt   . Multiple sclerosis Paternal Aunt   . COPD Neg Hx   . Breast cancer Neg Hx    Social History   Social History  . Marital status: Married    Spouse name: Grayland Ormond  . Number of children: 1  . Years of education: N/A   Occupational History  . Not on file.   Social History Main Topics  . Smoking status: Former Smoker    Packs/day: 1.50    Years: 20.00    Types: Cigarettes    Quit date: 09/10/2005  .  Smokeless tobacco: Never Used     Comment: quit 7 years ago   . Alcohol use Yes     Comment: rarely  . Drug use: No  . Sexual activity: Yes    Partners: Male    Birth control/ protection: Rhythm   Other Topics Concern  . Not on file   Social History Narrative  . No narrative on file   Interim medical history since last visit reviewed. Allergies and medications reviewed  Review of Systems Per HPI unless specifically indicated above     Objective:    BP 134/74 (BP Location: Left Arm, Patient Position: Sitting, Cuff Size: Large)   Pulse 98   Ht 5\' 7"  (1.702 m)   Wt 252 lb 6.4 oz (114.5 kg)   LMP 05/25/2017   BMI 39.53 kg/m   Wt Readings from Last 3 Encounters:  06/08/17 252 lb 6.4 oz (114.5 kg)  06/05/17 245 lb 4.8 oz (111.3 kg)  06/04/17 247 lb 11.2 oz (112.4 kg)    Physical Exam  Constitutional: She appears well-developed and well-nourished.  Morbidly obese  HENT:  Mouth/Throat: Mucous membranes are normal.  Eyes: EOM are normal. No scleral icterus.  Cardiovascular: Normal rate and regular rhythm.   Pulmonary/Chest: Effort normal. No accessory muscle usage. No tachypnea. No respiratory distress. She has no decreased breath sounds. She has no wheezes. She has rhonchi in the right middle field.  Abdominal: She exhibits no distension.  Musculoskeletal: She exhibits no edema.  Lymphadenopathy:    She has no cervical adenopathy.  Neurological: She is alert.  Skin: She is not diaphoretic. No pallor.  Psychiatric: She has a normal mood and affect. Her behavior is normal.   Diabetic Foot Form - Detailed   Diabetic Foot Exam - detailed Diabetic Foot exam was performed with the following findings:  Yes 06/08/2017  4:00 PM  Visual Foot Exam completed.:  Yes  Pulse Foot Exam completed.:  Yes  Right Dorsalis Pedis:  Present Left Dorsalis Pedis:  Present  Sensory Foot Exam Completed.:  Yes Semmes-Weinstein Monofilament Test R Site 1-Great Toe:  Pos L Site 1-Great Toe:   Pos            Assessment & Plan:   Problem List Items Addressed This Visit      Digestive   GERD (gastroesophageal reflux disease) (Chronic)    I believe significant weight loss should help this condition        Endocrine   Type 2 diabetes mellitus ([place]) (Chronic)    I sincerely believe that significant weight loss will improve this condition and prevent or at least delay any complications from diabetes including heart attack and stroke  Hypothyroidism (Chronic)    TSH already checked by surgeon per patient        Other   RESOLVED: Need for pneumococcal vaccination   Morbid obesity ([place]) (Chronic)    BMI > 35 plus diabetes; seeking surgical treatment for this disease; I believe significant weight loss will help her diabetes, GERD, other chronic health issues; today, we discussed decreasing total calories, and she'll try to pack her lunch instead of eating fast food      High cholesterol    TG was 200; recheck from surgeon was better; expect weight loss to help this condition       Other Visit Diagnoses    Pneumonia of right middle lobe due to infectious organism ([place])    -  Primary   improving clinically; cont abx, risk of c diff discussed, f/u cxr 3 weeks   Relevant Medications   budesonide-formoterol (SYMBICORT) 160-4.5 MCG/ACT inhaler      Follow up plan: No Follow-up on file.  An after-visit summary was printed and given to the patient at [facility].  Please see the patient instructions which may contain other information and recommendations beyond what is mentioned above in the assessment and plan.  Meds ordered this encounter  Medications  . Flaxseed Oil (LINSEED OIL) OIL    Sig: Use.  Marland Kitchen DISCONTD: IRON PO    Sig: Take 150 mg by mouth 2 (two) times a week.   . budesonide-formoterol (SYMBICORT) 160-4.5 MCG/ACT inhaler    Sig: Reported on 12/13/2015  . Multiple Vitamin (MULTIVITAMIN) tablet    Sig: Take 1 tablet by mouth daily.  . Multiple  Vitamins-Minerals (EMERGEN-C IMMUNE PO)    Sig: Take by mouth.    No orders of the defined types were placed in this encounter.

## 2017-06-08 NOTE — Assessment & Plan Note (Signed)
TG was 200; recheck from surgeon was better; expect weight loss to help this condition

## 2017-06-08 NOTE — Assessment & Plan Note (Addendum)
BMI > 35 plus diabetes; seeking surgical treatment for this disease; I believe significant weight loss will help her diabetes, GERD, other chronic health issues; today, we discussed decreasing total calories, and she'll try to pack her lunch instead of eating fast food

## 2017-06-08 NOTE — Assessment & Plan Note (Signed)
I believe significant weight loss should help this condition

## 2017-06-18 ENCOUNTER — Encounter: Payer: 59 | Admitting: Registered"

## 2017-06-18 DIAGNOSIS — E119 Type 2 diabetes mellitus without complications: Secondary | ICD-10-CM

## 2017-06-19 NOTE — Progress Notes (Signed)
  Pre-Operative Nutrition Class:  Appt start time: 8:15   End time:  9:15.  Patient was seen on 06/18/2017 for Pre-Operative [facility]ric Surgery [facility]   Surgery date: 08/07/2017 Surgery type: RYGB [facility] weight [facility]: 253.6 Weight today: 254.1   Samples given per MNT protocol. Patient educated on appropriate usage: Bariatric Advantage Multivitamin Lot # [id number] Exp: 02/2018  Bariatric Advantage Calcium Citrate (chocolate) Lot # [id number] Exp: 05/24/2018  Bariatric Advantage Calcium Citrate (lemon) Lot # [id number] Exp: 05/03/2018  Renee Pain Protein Shake Lot # [id number] Exp: 01/13/2018    The following the learning objectives were met by the patient during this course:  Identify Pre-Op Dietary Goals and will begin 2 weeks pre-operatively  Identify appropriate sources of fluids and proteins   State protein recommendations and appropriate sources pre and post-operatively  Identify Post-Operative Dietary Goals and will follow for 2 weeks post-operatively  Identify appropriate multivitamin and calcium sources  Describe the need for physical activity post-operatively and will follow MD recommendations  State when to call healthcare provider regarding medication questions or post-operative complications  Handouts given during class include:  Pre-Op Bariatric Surgery Diet Handout  Protein Shake Handout  Post-Op Bariatric Surgery Nutrition Handout  BELT Program Information Flyer  Support Group Information Flyer  [facility] Bariatric Supplements Price List  Follow-Up Plan: Patient will follow-up at [facility] 2 weeks post operatively for diet advancement per MD.

## 2017-06-21 DIAGNOSIS — J189 Pneumonia, unspecified organism: Secondary | ICD-10-CM

## 2017-06-21 HISTORY — DX: Pneumonia, unspecified organism: J18.9

## 2017-06-28 DIAGNOSIS — G4730 Sleep apnea, unspecified: Secondary | ICD-10-CM | POA: Diagnosis not present

## 2017-06-29 ENCOUNTER — Ambulatory Visit
Admission: RE | Admit: 2017-06-29 | Discharge: 2017-06-29 | Disposition: A | Discharge status: Home / Self Care | Payer: 59 | Source: Ambulatory Visit | Attending: Family Medicine | Admitting: Family Medicine

## 2017-06-29 DIAGNOSIS — J189 Pneumonia, unspecified organism: Secondary | ICD-10-CM | POA: Diagnosis not present

## 2017-06-29 DIAGNOSIS — J181 Lobar pneumonia, unspecified organism: Secondary | ICD-10-CM | POA: Insufficient documentation

## 2017-06-29 DIAGNOSIS — R918 Other nonspecific abnormal finding of lung field: Secondary | ICD-10-CM | POA: Insufficient documentation

## 2017-07-04 DIAGNOSIS — G4730 Sleep apnea, unspecified: Secondary | ICD-10-CM | POA: Diagnosis not present

## 2017-07-11 ENCOUNTER — Institutional Professional Consult (permissible substitution): Payer: 59 | Admitting: Neurology

## 2017-07-18 DIAGNOSIS — J019 Acute sinusitis, unspecified: Secondary | ICD-10-CM | POA: Diagnosis not present

## 2017-07-27 DIAGNOSIS — K219 Gastro-esophageal reflux disease without esophagitis: Secondary | ICD-10-CM | POA: Diagnosis not present

## 2017-07-27 DIAGNOSIS — G478 Other sleep disorders: Secondary | ICD-10-CM | POA: Diagnosis not present

## 2017-07-27 DIAGNOSIS — E039 Hypothyroidism, unspecified: Secondary | ICD-10-CM | POA: Diagnosis not present

## 2017-08-01 ENCOUNTER — Ambulatory Visit: Payer: Self-pay | Admitting: General Surgery

## 2017-08-01 DIAGNOSIS — M25561 Pain in right knee: Secondary | ICD-10-CM | POA: Diagnosis not present

## 2017-08-01 DIAGNOSIS — I10 Essential (primary) hypertension: Secondary | ICD-10-CM | POA: Diagnosis not present

## 2017-08-01 NOTE — H&P (Signed)
Amber Stanley 08/01/2017 10:33 AM Location: [facility] Patient #: [id number] DOB: July 27, 1971 Married / Language: English / Race: White Female  History of Present Illness Amber Stanley; 08/01/2017 2:07 PM) The patient is a 46 year old female who presents for a bariatric surgery evaluation. She comes in today for a preoperative appointment. I initially met her September 25. Her weight at that time was 258 pounds. She did develop pneumonia and her right long in October. She states that she is recovered. She denies any fever or chills. She still has occasional cough. She denies any shorts of breath or dyspnea on exertion. She did have to home sleep studies which were unremarkable. Her upper GI was unremarkable. Bariatric evaluation labs revealed a hemoglobin A1c of 7.2. She did also have some dyslipidemia. Otherwise she denies any medical changes. She denies any tobacco use. She denies any chest pain, chest pressure, shorts of breath, orthopnea or dyspnea on exertion. She denies any abdominal pain, melena or hematochezia. She reports a good appetite.   05/15/17 She is referred by Dr Sanda Klein for evaluation of weight loss surgery. She completed our Neurosurgeon. Right now she is undecided about what type of procedure. One of her main goals is to be able to get off of insulin. Despite numerous attempts for sustained weight loss she has been unsuccessful. She has tried Weight Watchers, Slim fast, Metabolife, as well as supervised weight loss programs-all without any long-term success.  Her comorbidities include hypertension, gastroesophageal reflux, chronic bilateral knee pain, diabetes mellitus type 2. There is also concern for possible obstructive sleep apnea. Her stop bang questionnaire was positive. Dr Gabriel Carina manages her diabetes at [facility] clinic.  Marland Kitchen She has had surgery for carpal tunnel. She states that she is been a diabetic for 15 years and is on insulin pump.  She denies any issues with severe headaches, TIAs or amaurosis fugax. She quit smoking over 11 years ago. She denies any significant alcohol intake and denies any drug use.  Update June 07, 2017 I was able to get access to her upper endoscopy and colonoscopy records. She had these done in January. Her upper endoscopy was normal no evidence of abnormalities. She had a few small diverticuli in her sigmoid colon.   Problem List/Past Medical Amber Hiss M. Redmond Pulling, Stanley; 08/01/2017 2:08 PM) BILATERAL CHRONIC KNEE PAIN (M25.561, M25.562) HYPERTENSION, ESSENTIAL (I10) SNORING (R06.83) MORBID OBESITY (E66.01)  Past Surgical History Amber Hiss M. Redmond Pulling, Stanley; 08/01/2017 2:08 PM) Cesarean Section - 1 Foot Surgery Left. Gallbladder Surgery - Laparoscopic Oral Surgery  Diagnostic Studies History Amber Hiss M. Redmond Pulling, Stanley; 08/01/2017 2:08 PM) Colonoscopy within last year Mammogram within last year Pap Smear 1-5 years ago  Allergies Amber Hiss M. Redmond Pulling, Stanley; 08/01/2017 2:08 PM) No Known Drug Allergies 05/15/2017  Medication History Amber Hiss M. Redmond Pulling, Stanley; 08/01/2017 2:08 PM) OxyCODONE HCl (5MG/5ML Solution, 5-10 Milliliter Oral every four hours, as needed, Taken starting 08/01/2017) Active. Zofran (4MG Tablet, 1 (one) Tablet Oral every eight hours, as needed, Taken starting 08/01/2017) Active. Advair Diskus (100-50MCG/DOSE Aero Pow Br Act, Inhalation) Active. Levothyroxine Sodium (75MCG Tablet, Oral) Active. HumaLOG (100UNIT/ML Solution, Subcutaneous) Active. MetFORMIN HCl (1000MG Tablet, Oral) Active. Metoprolol Succinate ER (50MG Tablet ER 24HR, Oral) Active. Montelukast Sodium (10MG Tablet, Oral) Active. Victoza (18MG/3ML Soln Pen-inj, Subcutaneous) Active. Omeprazole (20MG Tablet DR, Oral) Active. Vitamin B Complex (Oral) Active. Vitamin D3 (5000UNIT Tablet, Oral) Active. Iron (325 (65 Fe)MG Tablet, Oral) Active.  Social History Amber Hiss M. Redmond Pulling, Stanley; 08/01/2017 2:08 PM) Alcohol  use Occasional alcohol use. Caffeine use Carbonated beverages, Coffee, Tea. No drug use Tobacco use Former smoker.  Family History Amber Hiss M. Redmond Pulling, Stanley; 08/01/2017 2:08 PM) Alcohol Abuse Family Members In General. Arthritis Family Members In General, Father, Mother. Cancer Brother. Cerebrovascular Accident Family Members In General. Colon Polyps Mother. Diabetes Mellitus Family Members In General, Mother. Heart Disease Family Members In General, Mother, Sister. Hypertension Family Members In General, Mother. Ischemic Bowel Disease Mother. Kidney Disease Family Members In General. Melanoma Family Members In General. Migraine Headache Family Members In General. Respiratory Condition Family Members In General. Thyroid problems Family Members In General.  Pregnancy / Birth History Amber Ruff. Redmond Pulling, Stanley; 08/01/2017 2:08 PM) Age at menarche 41 years. Contraceptive History Oral contraceptives. Gravida 1 Maternal age 66-40 Para 1  Other Problems Amber Hiss M. Redmond Pulling, Stanley; 08/01/2017 2:08 PM) Asthma Back Pain Cholelithiasis Diverticulosis Heart murmur Hypercholesterolemia Migraine Headache GASTROESOPHAGEAL REFLUX DISEASE, ESOPHAGITIS PRESENCE NOT SPECIFIED (K21.9) DIABETES MELLITUS TYPE 2 IN OBESE (E11.69)     Review of Systems Amber Hiss M. Madisun Hargrove Stanley; 08/01/2017 2:05 PM) General Present- Night Sweats. Not Present- Appetite Loss, Chills, Fatigue, Fever, Weight Gain and Weight Loss. Skin Not Present- Change in Wart/Mole, Dryness, Hives, Jaundice, New Lesions, Non-Healing Wounds, Rash and Ulcer. HEENT Present- Sinus Pain and Wears glasses/contact lenses. Not Present- Earache, Hearing Loss, Hoarseness, Nose Bleed, Oral Ulcers, Ringing in the Ears, Seasonal Allergies, Sore Throat, Visual Disturbances and Yellow Eyes. Respiratory Present- Snoring. Not Present- Bloody sputum, Chronic Cough, Difficulty Breathing and Wheezing. Breast Not Present- Breast Mass, Breast  Pain, Nipple Discharge and Skin Changes. Cardiovascular Not Present- Chest Pain, Difficulty Breathing Lying Down, Leg Cramps, Palpitations, Rapid Heart Rate, Shortness of Breath and Swelling of Extremities. Gastrointestinal Present- Indigestion. Not Present- Abdominal Pain, Bloating, Bloody Stool, Change in Bowel Habits, Chronic diarrhea, Constipation, Difficulty Swallowing, Excessive gas, Gets full quickly at meals, Hemorrhoids, Nausea, Rectal Pain and Vomiting. Female Genitourinary Not Present- Frequency, Nocturia, Painful Urination, Pelvic Pain and Urgency. Musculoskeletal Not Present- Back Pain, Joint Pain, Joint Stiffness, Muscle Pain, Muscle Weakness and Swelling of Extremities. Neurological Not Present- Decreased Memory, Fainting, Headaches, Numbness, Seizures, Tingling, Tremor, Trouble walking and Weakness. Psychiatric Present- Frequent crying. Not Present- Anxiety, Bipolar, Change in Sleep Pattern, Depression and Fearful. Endocrine Present- Heat Intolerance. Not Present- Cold Intolerance, Excessive Hunger, Hair Changes, Hot flashes and New Diabetes. Hematology Not Present- Easy Bruising, Excessive bleeding, Gland problems, HIV and Persistent Infections.  Vitals (Amber Stanley CMA; 08/01/2017 10:34 AM) 08/01/2017 10:33 AM Weight: 258 lb Height: 67.5in Body Surface Area: 2.27 m Body Mass Index: 39.81 kg/m  Pulse: 89 (Regular)  BP: 138/82 (Sitting, Left Arm, Standard)      Physical Exam Amber Hiss M. Dorthy Magnussen Stanley; 08/01/2017 2:05 PM)  General Mental Status-Alert. General Appearance-Consistent with stated age. Hydration-Well hydrated. Voice-Normal. Note: morbidly obese  Head and Neck Head-normocephalic, atraumatic with no lesions or palpable masses. Trachea-midline. Thyroid Gland Characteristics - normal size and consistency.  Eye Eyeball - Bilateral-Extraocular movements intact. Sclera/Conjunctiva - Bilateral-No scleral icterus.  ENMT Ears  -Note:normal external ears.  Mouth and Throat -Note:lips intact.   Chest and Lung Exam Chest and lung exam reveals -quiet, even and easy respiratory effort with no use of accessory muscles and on auscultation, normal breath sounds, no adventitious sounds and normal vocal resonance. Inspection Chest Wall - Normal. Back - normal.  Breast - Did not examine.  Cardiovascular Cardiovascular examination reveals -normal heart sounds, regular rate and rhythm with no murmurs and normal pedal pulses bilaterally.  Abdomen Inspection Inspection  of the abdomen reveals - No Hernias. Skin - Scar - Note: well healed trocar scars. Palpation/Percussion Palpation and Percussion of the abdomen reveal - Soft, Non Tender, No Rebound tenderness, No Rigidity (guarding) and No hepatosplenomegaly. Auscultation Auscultation of the abdomen reveals - Bowel sounds normal.  Peripheral Vascular Upper Extremity Palpation - Pulses bilaterally normal.  Neurologic Neurologic evaluation reveals -alert and oriented x 3 with no impairment of recent or remote memory. Mental Status-Normal.  Neuropsychiatric The patient's mood and affect are described as -normal. Judgment and Insight-insight is appropriate concerning matters relevant to self.  Musculoskeletal Normal Exam - Left-Upper Extremity Strength Normal and Lower Extremity Strength Normal. Normal Exam - Right-Upper Extremity Strength Normal and Lower Extremity Strength Normal.  Lymphatic Head & Neck  General Head & Neck Lymphatics: Bilateral - Description - Normal. Axillary - Did not examine. Femoral & Inguinal - Did not examine.    Assessment & Plan Amber Hiss M. Hadeel Hillebrand Stanley; 08/01/2017 2:08 PM)  MORBID OBESITY (E66.01) Impression: It appears that she is recovered from her pneumonia. We reviewed her preoperative workup. We discussed the preoperative diet plan again. We also discussed the postoperative recovery as well as the typical  postoperative follow-up plan. She has attended her preop education class. She was given her postoperative prescriptions today. All of her questions were asked and answered.  Current Plans Pt Education - EMW_preopbariatric HYPERTENSION, ESSENTIAL (I10)  BILATERAL CHRONIC KNEE PAIN (M25.561)  DIABETES MELLITUS TYPE 2 IN OBESE (E11.69)  GASTROESOPHAGEAL REFLUX DISEASE, ESOPHAGITIS PRESENCE NOT SPECIFIED (K21.9)  Amber Ruff. Redmond Pulling, Stanley, Amber Stanley General, Bariatric, & Minimally Invasive Surgery [facility], [place]

## 2017-08-01 NOTE — H&P (View-Only) (Signed)
Amber Stanley 08/01/2017 10:33 AM Location: [facility] Patient #: [id number] DOB: July 27, 1971 Married / Language: English / Race: White Female  History of Present Illness Amber Hiss M. Amber Stanley; 08/01/2017 2:07 PM) The patient is a 46 year old female who presents for a bariatric surgery evaluation. She comes in today for a preoperative appointment. I initially met her September 25. Her weight at that time was 258 pounds. She did develop pneumonia and her right long in October. She states that she is recovered. She denies any fever or chills. She still has occasional cough. She denies any shorts of breath or dyspnea on exertion. She did have to home sleep studies which were unremarkable. Her upper GI was unremarkable. Bariatric evaluation labs revealed a hemoglobin A1c of 7Stanley2. She did also have some dyslipidemia. Otherwise she denies any medical changes. She denies any tobacco use. She denies any chest pain, chest pressure, shorts of breath, orthopnea or dyspnea on exertion. She denies any abdominal pain, melena or hematochezia. She reports a good appetite.   05/15/17 She is referred by Amber Stanley for evaluation of weight loss surgery. She completed our Amber Stanley. Right now she is undecided about what type of procedure. One of her main goals is to be able to get off of insulin. Despite numerous attempts for sustained weight loss she has been unsuccessful. She has tried Weight Watchers, Slim fast, Metabolife, as well as supervised weight loss programs-all without any long-term success.  Her comorbidities include hypertension, gastroesophageal reflux, chronic bilateral knee pain, diabetes mellitus type 2. There is also concern for possible obstructive sleep apnea. Her stop bang questionnaire was positive. Amber Stanley manages her diabetes at Amber Stanley clinic.  Amber Kitchen She has had surgery for carpal tunnel. She states that she is been a diabetic for 15 years and is on insulin pump.  She denies any issues with severe headaches, TIAs or amaurosis fugax. She quit smoking over 11 years ago. She denies any significant alcohol intake and denies any drug use.  Update June 07, 2017 I was able to get access to her upper endoscopy and colonoscopy records. She had these done in January. Her upper endoscopy was normal no evidence of abnormalities. She had a few small diverticuli in her sigmoid colon.   Problem List/Past Medical Amber Hiss M. Redmond Pulling, Stanley; 08/01/2017 2:08 PM) BILATERAL CHRONIC KNEE PAIN (M25Stanley561, M25Stanley562) HYPERTENSION, ESSENTIAL (I10) SNORING (R06Stanley83) MORBID OBESITY (E66Stanley01)  Past Surgical History Amber Hiss M. Redmond Pulling, Stanley; 08/01/2017 2:08 PM) Cesarean Section - 1 Foot Surgery Left. Gallbladder Surgery - Laparoscopic Oral Surgery  Diagnostic Studies History Amber Hiss M. Redmond Pulling, Stanley; 08/01/2017 2:08 PM) Colonoscopy within last year Mammogram within last year Pap Smear 1-5 years ago  Allergies Amber Hiss M. Redmond Pulling, Stanley; 08/01/2017 2:08 PM) No Known Drug Allergies 05/15/2017  Medication History Amber Hiss M. Redmond Pulling, Stanley; 08/01/2017 2:08 PM) OxyCODONE HCl (5MG/5ML Solution, 5-10 Milliliter Oral every four hours, as needed, Taken starting 08/01/2017) Active. Zofran (4MG Tablet, 1 (one) Tablet Oral every eight hours, as needed, Taken starting 08/01/2017) Active. Advair Diskus (100-50MCG/DOSE Aero Pow Br Act, Inhalation) Active. Levothyroxine Sodium (75MCG Tablet, Oral) Active. HumaLOG (100UNIT/ML Solution, Subcutaneous) Active. MetFORMIN HCl (1000MG Tablet, Oral) Active. Metoprolol Succinate ER (50MG Tablet ER 24HR, Oral) Active. Montelukast Sodium (10MG Tablet, Oral) Active. Victoza (18MG/3ML Soln Pen-inj, Subcutaneous) Active. Omeprazole (20MG Tablet Amber, Oral) Active. Vitamin B Complex (Oral) Active. Vitamin D3 (5000UNIT Tablet, Oral) Active. Iron (325 (65 Fe)MG Tablet, Oral) Active.  Social History Amber Hiss M. Redmond Pulling, Stanley; 08/01/2017 2:08 PM) Alcohol  use Occasional alcohol use. Caffeine use Carbonated beverages, Coffee, Tea. No drug use Tobacco use Former smoker.  Family History Amber Hiss M. Redmond Pulling, Stanley; 08/01/2017 2:08 PM) Alcohol Abuse Family Members In General. Arthritis Family Members In General, Father, Mother. Cancer Brother. Cerebrovascular Accident Family Members In General. Colon Polyps Mother. Diabetes Mellitus Family Members In General, Mother. Heart Disease Family Members In General, Mother, Sister. Hypertension Family Members In General, Mother. Ischemic Bowel Disease Mother. Kidney Disease Family Members In General. Melanoma Family Members In General. Migraine Headache Family Members In General. Respiratory Condition Family Members In General. Thyroid problems Family Members In General.  Pregnancy / Birth History Amber Stanley. Redmond Pulling, Stanley; 08/01/2017 2:08 PM) Age at menarche 41 years. Contraceptive History Oral contraceptives. Gravida 1 Maternal age 66-40 Para 1  Other Problems Amber Hiss M. Redmond Pulling, Stanley; 08/01/2017 2:08 PM) Asthma Back Pain Cholelithiasis Diverticulosis Heart murmur Hypercholesterolemia Migraine Headache GASTROESOPHAGEAL REFLUX DISEASE, ESOPHAGITIS PRESENCE NOT SPECIFIED (K21Stanley9) DIABETES MELLITUS TYPE 2 IN OBESE (E11Stanley69)     Review of Systems Amber Hiss M. Amber Stanley; 08/01/2017 2:05 PM) General Present- Night Sweats. Not Present- Appetite Loss, Chills, Fatigue, Fever, Weight Gain and Weight Loss. Skin Not Present- Change in Wart/Mole, Dryness, Hives, Jaundice, New Lesions, Non-Healing Wounds, Rash and Ulcer. HEENT Present- Sinus Pain and Wears glasses/contact lenses. Not Present- Earache, Hearing Loss, Hoarseness, Nose Bleed, Oral Ulcers, Ringing in the Ears, Seasonal Allergies, Sore Throat, Visual Disturbances and Yellow Eyes. Respiratory Present- Snoring. Not Present- Bloody sputum, Chronic Cough, Difficulty Breathing and Wheezing. Breast Not Present- Breast Mass, Breast  Pain, Nipple Discharge and Skin Changes. Cardiovascular Not Present- Chest Pain, Difficulty Breathing Lying Down, Leg Cramps, Palpitations, Rapid Heart Rate, Shortness of Breath and Swelling of Extremities. Gastrointestinal Present- Indigestion. Not Present- Abdominal Pain, Bloating, Bloody Stool, Change in Bowel Habits, Chronic diarrhea, Constipation, Difficulty Swallowing, Excessive gas, Gets full quickly at meals, Hemorrhoids, Nausea, Rectal Pain and Vomiting. Female Genitourinary Not Present- Frequency, Nocturia, Painful Urination, Pelvic Pain and Urgency. Musculoskeletal Not Present- Back Pain, Joint Pain, Joint Stiffness, Muscle Pain, Muscle Weakness and Swelling of Extremities. Neurological Not Present- Decreased Memory, Fainting, Headaches, Numbness, Seizures, Tingling, Tremor, Trouble walking and Weakness. Psychiatric Present- Frequent crying. Not Present- Anxiety, Bipolar, Change in Sleep Pattern, Depression and Fearful. Endocrine Present- Heat Intolerance. Not Present- Cold Intolerance, Excessive Hunger, Hair Changes, Hot flashes and New Diabetes. Hematology Not Present- Easy Bruising, Excessive bleeding, Gland problems, HIV and Persistent Infections.  Vitals (Janette Ranson CMA; 08/01/2017 10:34 AM) 08/01/2017 10:33 AM Weight: 258 lb Height: 67Stanley5in Body Surface Area: 2Stanley27 m Body Mass Index: 39Stanley81 kg/m  Pulse: 89 (Regular)  BP: 138/82 (Sitting, Left Arm, Standard)      Physical Exam Amber Hiss M. Tyffany Waldrop Stanley; 08/01/2017 2:05 PM)  General Mental Status-Alert. General Appearance-Consistent with stated age. Hydration-Well hydrated. Voice-Normal. Note: morbidly obese  Head and Neck Head-normocephalic, atraumatic with no lesions or palpable masses. Trachea-midline. Thyroid Gland Characteristics - normal size and consistency.  Eye Eyeball - Bilateral-Extraocular movements intact. Sclera/Conjunctiva - Bilateral-No scleral icterus.  ENMT Ears  -Note:normal external ears.  Mouth and Throat -Note:lips intact.   Chest and Lung Exam Chest and lung exam reveals -quiet, even and easy respiratory effort with no use of accessory muscles and on auscultation, normal breath sounds, no adventitious sounds and normal vocal resonance. Inspection Chest Wall - Normal. Back - normal.  Breast - Did not examine.  Cardiovascular Cardiovascular examination reveals -normal heart sounds, regular rate and rhythm with no murmurs and normal pedal pulses bilaterally.  Abdomen Inspection Inspection  of the abdomen reveals - No Hernias. Skin - Scar - Note: well healed trocar scars. Palpation/Percussion Palpation and Percussion of the abdomen reveal - Soft, Non Tender, No Rebound tenderness, No Rigidity (guarding) and No hepatosplenomegaly. Auscultation Auscultation of the abdomen reveals - Bowel sounds normal.  Peripheral Vascular Upper Extremity Palpation - Pulses bilaterally normal.  Neurologic Neurologic evaluation reveals -alert and oriented x 3 with no impairment of recent or remote memory. Mental Status-Normal.  Neuropsychiatric The patient's mood and affect are described as -normal. Judgment and Insight-insight is appropriate concerning matters relevant to self.  Musculoskeletal Normal Exam - Left-Upper Extremity Strength Normal and Lower Extremity Strength Normal. Normal Exam - Right-Upper Extremity Strength Normal and Lower Extremity Strength Normal.  Lymphatic Head & Neck  General Head & Neck Lymphatics: Bilateral - Description - Normal. Axillary - Did not examine. Femoral & Inguinal - Did not examine.    Assessment & Plan Amber Hiss M. Erickson Yamashiro Stanley; 08/01/2017 2:08 PM)  MORBID OBESITY (E66Stanley01) Impression: It appears that she is recovered from her pneumonia. We reviewed her preoperative workup. We discussed the preoperative diet plan again. We also discussed the postoperative recovery as well as the typical  postoperative follow-up plan. She has attended her preop education class. She was given her postoperative prescriptions today. All of her questions were asked and answered.  Current Plans Pt Education - EMW_preopbariatric HYPERTENSION, ESSENTIAL (I10)  BILATERAL CHRONIC KNEE PAIN ([id number])  DIABETES MELLITUS TYPE 2 IN OBESE (E11Stanley69)  GASTROESOPHAGEAL REFLUX DISEASE, ESOPHAGITIS PRESENCE NOT SPECIFIED (K21Stanley9)  Amber Stanley. Redmond Pulling, Stanley, FACS General, Bariatric, & Minimally Invasive Surgery [facility], [place]

## 2017-08-02 ENCOUNTER — Encounter (HOSPITAL_COMMUNITY): Payer: Self-pay

## 2017-08-02 NOTE — Pre-Procedure Instructions (Signed)
The following are in epic: EKG 06/04/14 Hgb A 1C 7.2 from Dr. Dois Davenport H&P note 08/01/17 Colonoscopy and endoscopy 09/05/16

## 2017-08-02 NOTE — Patient Instructions (Signed)
Amber Stanley  08/02/2017   Your procedure is scheduled on: Tuesday, Dec. 17, 2018   Report to [facility] Main  Entrance   Take [place]  elevators to 3rd floor to  [place] at 5:30 AM.    Call this number if you have problems the morning of surgery [phone]   Remember: ONLY 1 PERSON MAY GO WITH YOU TO SHORT STAY TO GET  READY MORNING OF [place].   Do not eat food or drink liquids :After Midnight.   Do NOT take the evening dose of Metformin the night before surgery    Take these medicines the morning of surgery with A SIP OF WATER: Metoprolol, Omeprazole, Levothyroxine   Use Asthma Inhalers and Flonase per normal routine    Bring rescue asthma inhaler day of surgery   DO NOT TAKE ANY DIABETIC MEDICATIONS DAY OF YOUR SURGERY                               You may not have any metal on your body including hair pins, jewelry, and body  piercings               Do not wear make-up, lotions, powders, perfumes, or deodorant             Do not wear nail polish.  Do not shave  48 hours prior to surgery.               Do not bring valuables to the hospital. [place].   Contacts, dentures or bridgework may not be worn into surgery.   Leave suitcase in the car. After surgery it may be brought to your room.              Please read over the following fact sheets you were given: _____________________________________________________________________ How to Manage Your Diabetes Before and After Surgery  Why is it important to control my blood sugar before and after surgery? . Improving blood sugar levels before and after surgery helps healing and can limit problems. . A way of improving blood sugar control is eating a healthy diet by: o  Eating less sugar and carbohydrates o  Increasing activity/exercise o  Talking with your doctor about reaching your blood sugar goals . High blood sugars  (greater than 180 mg/dL) can raise your risk of infections and slow your recovery, so you will need to focus on controlling your diabetes during the weeks before surgery. . Make sure that the doctor who takes care of your diabetes knows about your planned surgery including the date and location.  How do I manage my blood sugar before surgery? . Check your blood sugar at least 4 times a day, starting 2 days before surgery, to make sure that the level is not too high or low. o Check your blood sugar the morning of your surgery when you wake up and every 2 hours until you get to the Short Stay unit. . If your blood sugar is less than 70 mg/dL, you will need to treat for low blood sugar: o Do not take insulin. o Treat a low blood sugar (less than 70 mg/dL) with  cup of clear juice (cranberry or apple), 4 glucose tablets,  OR glucose gel. o Recheck blood sugar in 15 minutes after treatment (to make sure it is greater than 70 mg/dL). If your blood sugar is not greater than 70 mg/dL on recheck, call [phone] for further instructions. . Report your blood sugar to the short stay nurse when you get to Short Stay.  . If you are admitted to the hospital after surgery: o Your blood sugar will be checked by the staff and you will probably be given insulin after surgery (instead of oral diabetes medicines) to make sure you have good blood sugar levels. o The goal for blood sugar control after surgery is 80-180 mg/dL.   WHAT DO I DO ABOUT MY DIABETES MEDICATION?  Marland Kitchen Do not take oral diabetes medicines (pills) the morning of surgery.    For patients with insulin pumps: Contact your diabetes doctor for specific instructions before surgery. Decrease basal rates by 20% at midnight the night before your surgery. Note that if your surgery is planned to be longer than 2 hours, your insulin pump will be removed and intravenous (IV) insulin will be started and managed by the nurses and the anesthesiologist. You  will be able to restart your insulin pump once you are awake and able to manage it.  Make sure to bring insulin pump supplies to the hospital with you in case the  site needs to be changed.  Patient Signature:  Date:   Nurse Signature:  Date:   Reviewed and Endorsed by [facility] Patient Education Committee, August 2015            [facility] - Preparing for Surgery Before surgery, you can play an important role.  Because skin is not sterile, your skin needs to be as free of germs as possible.  You can reduce the number of germs on your skin by washing with CHG (chlorahexidine gluconate) soap before surgery.  CHG is an antiseptic cleaner which kills germs and bonds with the skin to continue killing germs even after washing. Please DO NOT use if you have an allergy to CHG or antibacterial soaps.  If your skin becomes reddened/irritated stop using the CHG and inform your nurse when you arrive at Short Stay. Do not shave (including legs and underarms) for at least 48 hours prior to the first CHG shower.  You may shave your face/neck.  Please follow these instructions carefully:  1.  Shower with CHG Soap the night before surgery and the  morning of surgery.  2.  If you choose to wash your hair, wash your hair first as usual with your normal  shampoo.  3.  After you shampoo, rinse your hair and body thoroughly to remove the shampoo.                             4.  Use CHG as you would any other liquid soap.  You can apply chg directly to the skin and wash.  Gently with a scrungie or clean washcloth.  5.  Apply the CHG Soap to your body ONLY FROM THE NECK DOWN.   Do   not use on face/ open                           Wound or open sores. Avoid contact with eyes, ears mouth and   genitals (private parts).  Wash face,  Genitals (private parts) with your normal soap.             6.  Wash thoroughly, paying special attention to the area where your    surgery  will be  performed.  7.  Thoroughly rinse your body with warm water from the neck down.  8.  DO NOT shower/wash with your normal soap after using and rinsing off the CHG Soap.                9.  Pat yourself dry with a clean towel.            10.  Wear clean pajamas.            11.  Place clean sheets on your bed the night of your first shower and do not  sleep with pets. Day of Surgery : Do not apply any lotions/deodorants the morning of surgery.  Please wear clean clothes to the hospital/surgery center.  FAILURE TO FOLLOW THESE INSTRUCTIONS MAY RESULT IN THE CANCELLATION OF YOUR SURGERY  PATIENT SIGNATURE_________________________________  NURSE SIGNATURE__________________________________  ________________________________________________________________________  WHAT IS A BLOOD TRANSFUSION? Blood Transfusion Information  A transfusion is the replacement of blood or some of its parts. Blood is made up of multiple cells which provide different functions.  Red blood cells carry oxygen and are used for blood loss replacement.  White blood cells fight against infection.  Platelets control bleeding.  Plasma helps clot blood.  Other blood products are available for specialized needs, such as hemophilia or other clotting disorders. BEFORE THE TRANSFUSION  Who gives blood for transfusions?   Healthy volunteers who are fully evaluated to make sure their blood is safe. This is blood bank blood. Transfusion therapy is the safest it has ever been in the practice of medicine. Before blood is taken from a donor, a complete history is taken to make sure that person has no history of diseases nor engages in risky social behavior (examples are intravenous drug use or sexual activity with multiple partners). The donor's travel history is screened to minimize risk of transmitting infections, such as malaria. The donated blood is tested for signs of infectious diseases, such as HIV and hepatitis. The blood is  then tested to be sure it is compatible with you in order to minimize the chance of a transfusion reaction. If you or a relative donates blood, this is often done in anticipation of surgery and is not appropriate for emergency situations. It takes many days to process the donated blood. RISKS AND COMPLICATIONS Although transfusion therapy is very safe and saves many lives, the main dangers of transfusion include:   Getting an infectious disease.  Developing a transfusion reaction. This is an allergic reaction to something in the blood you were given. Every precaution is taken to prevent this. The decision to have a blood transfusion has been considered carefully by your caregiver before blood is given. Blood is not given unless the benefits outweigh the risks. AFTER THE TRANSFUSION  Right after receiving a blood transfusion, you will usually feel much better and more energetic. This is especially true if your red blood cells have gotten low (anemic). The transfusion raises the level of the red blood cells which carry oxygen, and this usually causes an energy increase.  The nurse administering the transfusion will monitor you carefully for complications. HOME CARE INSTRUCTIONS  No special instructions are needed after a transfusion. You may find your energy is better. Speak with your  caregiver about any limitations on activity for underlying diseases you may have. SEEK MEDICAL CARE IF:   Your condition is not improving after your transfusion.  You develop redness or irritation at the intravenous (IV) site. SEEK IMMEDIATE MEDICAL CARE IF:  Any of the following symptoms occur over the next 12 hours:  Shaking chills.  You have a temperature by mouth above 102 F (38.9 C), not controlled by medicine.  Chest, back, or muscle pain.  People around you feel you are not acting correctly or are confused.  Shortness of breath or difficulty breathing.  Dizziness and fainting.  You get a rash  or develop hives.  You have a decrease in urine output.  Your urine turns a dark color or changes to pink, red, or brown. Any of the following symptoms occur over the next 10 days:  You have a temperature by mouth above 102 F (38.9 C), not controlled by medicine.  Shortness of breath.  Weakness after normal activity.  The white part of the eye turns yellow (jaundice).  You have a decrease in the amount of urine or are urinating less often.  Your urine turns a dark color or changes to pink, red, or brown. Document Released: 08/04/2000 Document Revised: 10/30/2011 Document Reviewed: 03/23/2008 [facility] Patient Information 2014 [place], [place].  _______________________________________________________________________

## 2017-08-03 ENCOUNTER — Ambulatory Visit (HOSPITAL_COMMUNITY)
Admission: RE | Admit: 2017-08-03 | Discharge: 2017-08-03 | Disposition: A | Discharge status: Home / Self Care | Payer: 59 | Source: Ambulatory Visit | Attending: General Surgery | Admitting: General Surgery

## 2017-08-03 ENCOUNTER — Encounter (HOSPITAL_COMMUNITY): Payer: Self-pay

## 2017-08-03 ENCOUNTER — Other Ambulatory Visit: Payer: Self-pay

## 2017-08-03 ENCOUNTER — Encounter (HOSPITAL_COMMUNITY)
Admission: RE | Admit: 2017-08-03 | Discharge: 2017-08-03 | Disposition: A | Discharge status: Home / Self Care | Payer: 59 | Source: Ambulatory Visit | Attending: General Surgery | Admitting: General Surgery

## 2017-08-03 DIAGNOSIS — R05 Cough: Secondary | ICD-10-CM | POA: Insufficient documentation

## 2017-08-03 DIAGNOSIS — R059 Cough, unspecified: Secondary | ICD-10-CM

## 2017-08-03 HISTORY — DX: Cardiac murmur, unspecified: R01.1

## 2017-08-03 HISTORY — DX: Gastro-esophageal reflux disease without esophagitis: K21.9

## 2017-08-03 HISTORY — DX: Pneumonia, unspecified organism: J18.9

## 2017-08-03 HISTORY — DX: Hypothyroidism, unspecified: E03.9

## 2017-08-03 LAB — COMPREHENSIVE METABOLIC PANEL
ALT: 19 U/L (ref 14–54)
ANION GAP: 7 (ref 5–15)
AST: 16 U/L (ref 15–41)
Albumin: 3.8 g/dL (ref 3.5–5.0)
Alkaline Phosphatase: 51 U/L (ref 38–126)
BUN: 9 mg/dL (ref 6–20)
CHLORIDE: 104 mmol/L (ref 101–111)
CO2: 25 mmol/L (ref 22–32)
CREATININE: 0.53 mg/dL (ref 0.44–1.00)
Calcium: 9 mg/dL (ref 8.9–10.3)
GFR calc Af Amer: >60 mL/min (ref >60)
Glucose, Bld: 198 mg/dL — ABNORMAL HIGH (ref 65–99)
POTASSIUM: 4 mmol/L (ref 3.5–5.1)
SODIUM: 136 mmol/L (ref 135–145)
Total Bilirubin: 0.5 mg/dL (ref 0.3–1.2)
Total Protein: 7.1 g/dL (ref 6.5–8.1)

## 2017-08-03 LAB — CBC WITH DIFFERENTIAL/PLATELET
Basophils Absolute: 0.1 10*3/uL (ref 0.0–0.1)
Basophils Relative: 1 %
EOS ABS: 0.3 10*3/uL (ref 0.0–0.7)
EOS PCT: 3 %
HCT: 34.8 % — ABNORMAL LOW (ref 36.0–46.0)
Hemoglobin: 10.9 g/dL — ABNORMAL LOW (ref 12.0–15.0)
LYMPHS ABS: 3.4 10*3/uL (ref 0.7–4.0)
LYMPHS PCT: 36 %
MCH: 24.7 pg — ABNORMAL LOW (ref 26.0–34.0)
MCHC: 31.3 g/dL (ref 30.0–36.0)
MCV: 78.7 fL (ref 78.0–100.0)
MONO ABS: 0.6 10*3/uL (ref 0.1–1.0)
Monocytes Relative: 6 %
Neutro Abs: 5.1 10*3/uL (ref 1.7–7.7)
Neutrophils Relative %: 54 %
PLATELETS: 340 10*3/uL (ref 150–400)
RBC: 4.42 MIL/uL (ref 3.87–5.11)
RDW: 14.8 % (ref 11.5–15.5)
WBC: 9.4 10*3/uL (ref 4.0–10.5)

## 2017-08-03 NOTE — Pre-Procedure Instructions (Signed)
Amber Stanley, DM coordinator spoke with Amber Stanley at length, Amber Stanley will contact Dr. Gabriel Carina at [facility] to discuss basal rate management of Insulin Pump.

## 2017-08-04 LAB — ABO/RH: ABO/RH(D): A POS

## 2017-08-06 NOTE — Anesthesia Preprocedure Evaluation (Addendum)
Anesthesia Evaluation  Patient identified by MRN, date of birth, ID band Patient awake    Reviewed: Allergy & Precautions, H&P , NPO status , Patient's Chart, lab work & pertinent test results  History of Anesthesia Complications Negative for: history of anesthetic complications  Airway Mallampati: III  TM Distance: >3 FB Neck ROM: full    Dental  (+) Poor Dentition, Chipped   Pulmonary neg shortness of breath, asthma , former smoker,    Pulmonary exam normal breath sounds clear to auscultation       Cardiovascular Exercise Tolerance: Good hypertension, Pt. on medications (-) angina(-) Past MI and (-) DOE Normal cardiovascular exam Rhythm:regular Rate:Normal     Neuro/Psych negative neurological ROS  negative psych ROS   GI/Hepatic Neg liver ROS, GERD  Controlled,  Endo/Other  diabetes, Type 2Hypothyroidism   Renal/GU negative Renal ROS  negative genitourinary   Musculoskeletal   Abdominal   Peds  Hematology negative hematology ROS (+)   Anesthesia Other Findings  Asthma  Diabetes mellitus without complication (HCC)  Hypertension  Morbid obesity Obesity Reflux  Thyroid disease   No date: BILATERAL CARPAL TUNNEL RELEASE No date: CESAREAN SECTION No date: CHOLECYSTECTOMY No date: DILATION AND CURETTAGE OF UTERUS No date: SESMOIDECTOMY     Comment: removed from foot  BMI    Body Mass Index:  36.49 kg/m      Reproductive/Obstetrics negative OB ROS                           Anesthesia Physical  Anesthesia Plan  ASA: III  Anesthesia Plan: General   Post-op Pain Management:    Induction: Intravenous  PONV Risk Score and Plan: 2 and Dexamethasone, Ondansetron, Treatment may vary due to age or medical condition and Midazolam  Airway Management Planned: Oral ETT  Additional Equipment:   Intra-op Plan:   Post-operative Plan: Extubation in OR  Informed Consent: I have  reviewed the patients History and Physical, chart, labs and discussed the procedure including the risks, benefits and alternatives for the proposed anesthesia with the patient or authorized representative who has indicated his/her understanding and acceptance.     Plan Discussed with: CRNA and Surgeon  Anesthesia Plan Comments: (  )       Anesthesia Quick Evaluation

## 2017-08-07 ENCOUNTER — Inpatient Hospital Stay (HOSPITAL_COMMUNITY)
Admission: RE | Admit: 2017-08-07 | Discharge: 2017-08-08 | DRG: 621 | Disposition: A | Discharge status: Home / Self Care | Payer: 59 | Source: Ambulatory Visit | Attending: General Surgery | Admitting: General Surgery

## 2017-08-07 ENCOUNTER — Inpatient Hospital Stay (HOSPITAL_COMMUNITY): Payer: 59 | Admitting: Anesthesiology

## 2017-08-07 ENCOUNTER — Encounter (HOSPITAL_COMMUNITY): Payer: Self-pay | Admitting: Emergency Medicine

## 2017-08-07 ENCOUNTER — Encounter (HOSPITAL_COMMUNITY)
Admission: RE | Disposition: A | Discharge status: Home / Self Care | Payer: Self-pay | Source: Ambulatory Visit | Attending: General Surgery

## 2017-08-07 ENCOUNTER — Other Ambulatory Visit: Payer: Self-pay

## 2017-08-07 DIAGNOSIS — K219 Gastro-esophageal reflux disease without esophagitis: Secondary | ICD-10-CM | POA: Diagnosis present

## 2017-08-07 DIAGNOSIS — D509 Iron deficiency anemia, unspecified: Secondary | ICD-10-CM | POA: Diagnosis present

## 2017-08-07 DIAGNOSIS — Z8261 Family history of arthritis: Secondary | ICD-10-CM | POA: Diagnosis not present

## 2017-08-07 DIAGNOSIS — G8929 Other chronic pain: Secondary | ICD-10-CM | POA: Diagnosis present

## 2017-08-07 DIAGNOSIS — Z6838 Body mass index (BMI) 38.0-38.9, adult: Secondary | ICD-10-CM | POA: Diagnosis not present

## 2017-08-07 DIAGNOSIS — I10 Essential (primary) hypertension: Secondary | ICD-10-CM | POA: Diagnosis not present

## 2017-08-07 DIAGNOSIS — E039 Hypothyroidism, unspecified: Secondary | ICD-10-CM | POA: Diagnosis present

## 2017-08-07 DIAGNOSIS — J45909 Unspecified asthma, uncomplicated: Secondary | ICD-10-CM | POA: Diagnosis present

## 2017-08-07 DIAGNOSIS — Z9884 Bariatric surgery status: Secondary | ICD-10-CM

## 2017-08-07 DIAGNOSIS — Z794 Long term (current) use of insulin: Secondary | ICD-10-CM | POA: Diagnosis not present

## 2017-08-07 DIAGNOSIS — M25561 Pain in right knee: Secondary | ICD-10-CM | POA: Diagnosis present

## 2017-08-07 DIAGNOSIS — Z9641 Presence of insulin pump (external) (internal): Secondary | ICD-10-CM | POA: Diagnosis present

## 2017-08-07 DIAGNOSIS — K210 Gastro-esophageal reflux disease with esophagitis: Secondary | ICD-10-CM | POA: Diagnosis present

## 2017-08-07 DIAGNOSIS — Z87891 Personal history of nicotine dependence: Secondary | ICD-10-CM | POA: Diagnosis not present

## 2017-08-07 DIAGNOSIS — Z79899 Other long term (current) drug therapy: Secondary | ICD-10-CM

## 2017-08-07 DIAGNOSIS — Z841 Family history of disorders of kidney and ureter: Secondary | ICD-10-CM | POA: Diagnosis not present

## 2017-08-07 DIAGNOSIS — Z79891 Long term (current) use of opiate analgesic: Secondary | ICD-10-CM | POA: Diagnosis not present

## 2017-08-07 DIAGNOSIS — E785 Hyperlipidemia, unspecified: Secondary | ICD-10-CM | POA: Diagnosis present

## 2017-08-07 DIAGNOSIS — Z91048 Other nonmedicinal substance allergy status: Secondary | ICD-10-CM | POA: Diagnosis not present

## 2017-08-07 DIAGNOSIS — G43909 Migraine, unspecified, not intractable, without status migrainosus: Secondary | ICD-10-CM | POA: Diagnosis present

## 2017-08-07 DIAGNOSIS — M549 Dorsalgia, unspecified: Secondary | ICD-10-CM | POA: Diagnosis present

## 2017-08-07 DIAGNOSIS — E282 Polycystic ovarian syndrome: Secondary | ICD-10-CM | POA: Diagnosis present

## 2017-08-07 DIAGNOSIS — R0683 Snoring: Secondary | ICD-10-CM | POA: Diagnosis present

## 2017-08-07 DIAGNOSIS — Z8249 Family history of ischemic heart disease and other diseases of the circulatory system: Secondary | ICD-10-CM

## 2017-08-07 DIAGNOSIS — E7800 Pure hypercholesterolemia, unspecified: Secondary | ICD-10-CM | POA: Diagnosis present

## 2017-08-07 DIAGNOSIS — R011 Cardiac murmur, unspecified: Secondary | ICD-10-CM | POA: Diagnosis present

## 2017-08-07 DIAGNOSIS — K660 Peritoneal adhesions (postprocedural) (postinfection): Secondary | ICD-10-CM | POA: Diagnosis present

## 2017-08-07 DIAGNOSIS — Z808 Family history of malignant neoplasm of other organs or systems: Secondary | ICD-10-CM

## 2017-08-07 DIAGNOSIS — E119 Type 2 diabetes mellitus without complications: Secondary | ICD-10-CM | POA: Diagnosis not present

## 2017-08-07 DIAGNOSIS — M25562 Pain in left knee: Secondary | ICD-10-CM | POA: Diagnosis present

## 2017-08-07 HISTORY — PX: ROUX-EN-Y GASTRIC BYPASS: SHX1104

## 2017-08-07 HISTORY — PX: GASTRIC ROUX-EN-Y: SHX5262

## 2017-08-07 LAB — GLUCOSE, CAPILLARY
GLUCOSE-CAPILLARY: 149 mg/dL — AB (ref 65–99)
GLUCOSE-CAPILLARY: 216 mg/dL — AB (ref 65–99)
GLUCOSE-CAPILLARY: 229 mg/dL — AB (ref 65–99)
GLUCOSE-CAPILLARY: 280 mg/dL — AB (ref 65–99)
Glucose-Capillary: 165 mg/dL — ABNORMAL HIGH (ref 65–99)
Glucose-Capillary: 274 mg/dL — ABNORMAL HIGH (ref 65–99)

## 2017-08-07 LAB — TYPE AND SCREEN
ABO/RH(D): A POS
ANTIBODY SCREEN: NEGATIVE

## 2017-08-07 LAB — HEMOGLOBIN AND HEMATOCRIT, BLOOD
HEMATOCRIT: 35.4 % — AB (ref 36.0–46.0)
HEMOGLOBIN: 11 g/dL — AB (ref 12.0–15.0)

## 2017-08-07 LAB — PREGNANCY, URINE: PREG TEST UR: NEGATIVE

## 2017-08-07 SURGERY — LAPAROSCOPIC ROUX-EN-Y GASTRIC BYPASS WITH UPPER ENDOSCOPY
Anesthesia: General | Site: Abdomen

## 2017-08-07 MED ORDER — ROCURONIUM BROMIDE 100 MG/10ML IV SOLN
INTRAVENOUS | Status: DC | PRN
  Administered 2017-08-07: 50 mg via INTRAVENOUS
  Administered 2017-08-07 (×3): 20 mg via INTRAVENOUS
  Administered 2017-08-07 (×2): 10 mg via INTRAVENOUS

## 2017-08-07 MED ORDER — METOPROLOL SUCCINATE ER 50 MG PO TB24
50.0000 mg | ORAL_TABLET | Freq: Every day | ORAL | Status: DC
  Administered 2017-08-08: 50 mg via ORAL
  Filled 2017-08-07: qty 1

## 2017-08-07 MED ORDER — SCOPOLAMINE 1 MG/3DAYS TD PT72
1.0000 | MEDICATED_PATCH | TRANSDERMAL | Status: DC
  Administered 2017-08-07: 1.5 mg via TRANSDERMAL
  Filled 2017-08-07: qty 1

## 2017-08-07 MED ORDER — ONDANSETRON HCL 4 MG/2ML IJ SOLN
INTRAMUSCULAR | Status: AC
  Filled 2017-08-07: qty 2

## 2017-08-07 MED ORDER — OXYCODONE HCL 5 MG/5ML PO SOLN: 5.0000 mg | ORAL | Status: DC | PRN

## 2017-08-07 MED ORDER — MIDAZOLAM HCL 5 MG/5ML IJ SOLN
INTRAMUSCULAR | Status: DC | PRN
  Administered 2017-08-07: 2 mg via INTRAVENOUS

## 2017-08-07 MED ORDER — KETAMINE HCL 10 MG/ML IJ SOLN
INTRAMUSCULAR | Status: AC
  Filled 2017-08-07: qty 1

## 2017-08-07 MED ORDER — FENTANYL CITRATE (PF) 100 MCG/2ML IJ SOLN
INTRAMUSCULAR | Status: AC
  Filled 2017-08-07: qty 4

## 2017-08-07 MED ORDER — POTASSIUM CHLORIDE IN NACL 20-0.45 MEQ/L-% IV SOLN
INTRAVENOUS | Status: DC
  Administered 2017-08-07 – 2017-08-08 (×4): via INTRAVENOUS
  Filled 2017-08-07 (×4): qty 1000

## 2017-08-07 MED ORDER — 0.9 % SODIUM CHLORIDE (POUR BTL) OPTIME
TOPICAL | Status: DC | PRN
  Administered 2017-08-07: 1000 mL

## 2017-08-07 MED ORDER — APREPITANT 40 MG PO CAPS
40.0000 mg | ORAL_CAPSULE | ORAL | Status: AC
  Administered 2017-08-07: 40 mg via ORAL
  Filled 2017-08-07: qty 1

## 2017-08-07 MED ORDER — CEFOTETAN DISODIUM-DEXTROSE 2-2.08 GM-%(50ML) IV SOLR
2.0000 g | INTRAVENOUS | Status: AC
  Administered 2017-08-07: 2 g via INTRAVENOUS

## 2017-08-07 MED ORDER — FENTANYL CITRATE (PF) 100 MCG/2ML IJ SOLN
25.0000 ug | INTRAMUSCULAR | Status: DC | PRN
  Administered 2017-08-07: 50 ug via INTRAVENOUS

## 2017-08-07 MED ORDER — ACETAMINOPHEN 160 MG/5ML PO SOLN
650.0000 mg | Freq: Four times a day (QID) | ORAL | Status: DC
  Administered 2017-08-07 – 2017-08-08 (×5): 650 mg via ORAL
  Filled 2017-08-07 (×6): qty 20.3

## 2017-08-07 MED ORDER — FENTANYL CITRATE (PF) 250 MCG/5ML IJ SOLN
INTRAMUSCULAR | Status: AC
  Filled 2017-08-07: qty 5

## 2017-08-07 MED ORDER — PROPOFOL 10 MG/ML IV BOLUS
INTRAVENOUS | Status: DC | PRN
  Administered 2017-08-07: 150 mg via INTRAVENOUS

## 2017-08-07 MED ORDER — DEXAMETHASONE SODIUM PHOSPHATE 10 MG/ML IJ SOLN
INTRAMUSCULAR | Status: DC | PRN
  Administered 2017-08-07: 10 mg via INTRAVENOUS

## 2017-08-07 MED ORDER — ONDANSETRON HCL 4 MG/2ML IJ SOLN: 4.0000 mg | Freq: Once | INTRAMUSCULAR | Status: DC | PRN

## 2017-08-07 MED ORDER — HEPARIN SODIUM (PORCINE) 5000 UNIT/ML IJ SOLN
5000.0000 [IU] | INTRAMUSCULAR | Status: AC
  Administered 2017-08-07: 5000 [IU] via SUBCUTANEOUS
  Filled 2017-08-07: qty 1

## 2017-08-07 MED ORDER — STERILE WATER FOR IRRIGATION IR SOLN
Status: DC | PRN
  Administered 2017-08-07: 2000 mL

## 2017-08-07 MED ORDER — MORPHINE SULFATE (PF) 2 MG/ML IV SOLN
1.0000 mg | INTRAVENOUS | Status: DC | PRN
  Administered 2017-08-07 (×3): 2 mg via INTRAVENOUS
  Administered 2017-08-07: 1 mg via INTRAVENOUS
  Administered 2017-08-08: 2 mg via INTRAVENOUS
  Filled 2017-08-07 (×5): qty 1

## 2017-08-07 MED ORDER — GABAPENTIN 300 MG PO CAPS
300.0000 mg | ORAL_CAPSULE | ORAL | Status: AC
  Administered 2017-08-07: 300 mg via ORAL
  Filled 2017-08-07: qty 1

## 2017-08-07 MED ORDER — LACTATED RINGERS IR SOLN
Status: DC | PRN
  Administered 2017-08-07: 1000 mL

## 2017-08-07 MED ORDER — EPHEDRINE SULFATE-NACL 50-0.9 MG/10ML-% IV SOSY
PREFILLED_SYRINGE | INTRAVENOUS | Status: DC | PRN
  Administered 2017-08-07: 5 mg via INTRAVENOUS

## 2017-08-07 MED ORDER — ENALAPRILAT 1.25 MG/ML IV SOLN
1.2500 mg | Freq: Four times a day (QID) | INTRAVENOUS | Status: DC | PRN
  Filled 2017-08-07: qty 1

## 2017-08-07 MED ORDER — MIDAZOLAM HCL 2 MG/2ML IJ SOLN
INTRAMUSCULAR | Status: AC
  Filled 2017-08-07: qty 2

## 2017-08-07 MED ORDER — CHLORHEXIDINE GLUCONATE 4 % EX LIQD: 60.0000 mL | Freq: Once | CUTANEOUS | Status: DC

## 2017-08-07 MED ORDER — LIDOCAINE HCL (CARDIAC) 20 MG/ML IV SOLN
INTRAVENOUS | Status: DC | PRN
  Administered 2017-08-07: 50 mg via INTRAVENOUS

## 2017-08-07 MED ORDER — EVICEL 5 ML EX KIT
PACK | Freq: Once | CUTANEOUS | Status: AC
  Administered 2017-08-07: 5 mL
  Filled 2017-08-07: qty 1

## 2017-08-07 MED ORDER — SUGAMMADEX SODIUM 500 MG/5ML IV SOLN
INTRAVENOUS | Status: AC
  Filled 2017-08-07: qty 5

## 2017-08-07 MED ORDER — ALBUTEROL SULFATE (2.5 MG/3ML) 0.083% IN NEBU: 3.0000 mL | INHALATION_SOLUTION | RESPIRATORY_TRACT | Status: DC | PRN

## 2017-08-07 MED ORDER — PROPOFOL 10 MG/ML IV BOLUS
INTRAVENOUS | Status: AC
  Filled 2017-08-07: qty 20

## 2017-08-07 MED ORDER — PREMIER PROTEIN SHAKE: 2.0000 [oz_av] | ORAL | Status: DC

## 2017-08-07 MED ORDER — BUPIVACAINE LIPOSOME 1.3 % IJ SUSP
20.0000 mL | Freq: Once | INTRAMUSCULAR | Status: AC
  Administered 2017-08-07: 20 mL
  Filled 2017-08-07: qty 20

## 2017-08-07 MED ORDER — MEPERIDINE HCL 50 MG/ML IJ SOLN: 6.2500 mg | INTRAMUSCULAR | Status: DC | PRN

## 2017-08-07 MED ORDER — DEXAMETHASONE SODIUM PHOSPHATE 4 MG/ML IJ SOLN: 4.0000 mg | INTRAMUSCULAR | Status: DC

## 2017-08-07 MED ORDER — LACTATED RINGERS IV SOLN
INTRAVENOUS | Status: DC | PRN
  Administered 2017-08-07 (×2): via INTRAVENOUS

## 2017-08-07 MED ORDER — LIDOCAINE 2% (20 MG/ML) 5 ML SYRINGE
INTRAMUSCULAR | Status: DC | PRN
  Administered 2017-08-07: 1.5 mg/kg/h via INTRAVENOUS

## 2017-08-07 MED ORDER — ROCURONIUM BROMIDE 50 MG/5ML IV SOSY
PREFILLED_SYRINGE | INTRAVENOUS | Status: AC
  Filled 2017-08-07: qty 5

## 2017-08-07 MED ORDER — SIMETHICONE 80 MG PO CHEW: 80.0000 mg | CHEWABLE_TABLET | Freq: Four times a day (QID) | ORAL | Status: DC | PRN

## 2017-08-07 MED ORDER — SUGAMMADEX SODIUM 500 MG/5ML IV SOLN
INTRAVENOUS | Status: DC | PRN
  Administered 2017-08-07: 400 mg via INTRAVENOUS

## 2017-08-07 MED ORDER — PANTOPRAZOLE SODIUM 40 MG IV SOLR
40.0000 mg | Freq: Every day | INTRAVENOUS | Status: DC
  Administered 2017-08-07: 40 mg via INTRAVENOUS
  Filled 2017-08-07: qty 40

## 2017-08-07 MED ORDER — EVICEL 5 ML EX KIT
PACK | Freq: Once | CUTANEOUS | Status: DC
  Filled 2017-08-07: qty 1

## 2017-08-07 MED ORDER — GABAPENTIN 250 MG/5ML PO SOLN
200.0000 mg | Freq: Two times a day (BID) | ORAL | Status: DC
  Administered 2017-08-07 – 2017-08-08 (×3): 200 mg via ORAL
  Filled 2017-08-07 (×5): qty 4

## 2017-08-07 MED ORDER — DEXAMETHASONE SODIUM PHOSPHATE 10 MG/ML IJ SOLN
INTRAMUSCULAR | Status: AC
  Filled 2017-08-07: qty 1

## 2017-08-07 MED ORDER — ACETAMINOPHEN 500 MG PO TABS
1000.0000 mg | ORAL_TABLET | ORAL | Status: AC
  Administered 2017-08-07: 1000 mg via ORAL
  Filled 2017-08-07: qty 2

## 2017-08-07 MED ORDER — ENOXAPARIN SODIUM 30 MG/0.3ML ~~LOC~~ SOLN
30.0000 mg | Freq: Two times a day (BID) | SUBCUTANEOUS | Status: DC
  Administered 2017-08-07 – 2017-08-08 (×2): 30 mg via SUBCUTANEOUS
  Filled 2017-08-07 (×2): qty 0.3

## 2017-08-07 MED ORDER — LIDOCAINE 2% (20 MG/ML) 5 ML SYRINGE
INTRAMUSCULAR | Status: AC
  Filled 2017-08-07: qty 5

## 2017-08-07 MED ORDER — FLUTICASONE PROPIONATE 50 MCG/ACT NA SUSP
1.0000 | Freq: Every day | NASAL | Status: DC
  Administered 2017-08-08: 1 via NASAL
  Filled 2017-08-07: qty 16

## 2017-08-07 MED ORDER — SODIUM CHLORIDE 0.9 % IJ SOLN
INTRAMUSCULAR | Status: DC | PRN
  Administered 2017-08-07: 50 mL

## 2017-08-07 MED ORDER — EPHEDRINE 5 MG/ML INJ
INTRAVENOUS | Status: AC
  Filled 2017-08-07: qty 10

## 2017-08-07 MED ORDER — LIDOCAINE HCL 2 % IJ SOLN
INTRAMUSCULAR | Status: AC
  Filled 2017-08-07: qty 20

## 2017-08-07 MED ORDER — SODIUM CHLORIDE 0.9 % IJ SOLN
INTRAMUSCULAR | Status: AC
  Filled 2017-08-07: qty 50

## 2017-08-07 MED ORDER — PROMETHAZINE HCL 25 MG/ML IJ SOLN: 12.5000 mg | Freq: Four times a day (QID) | INTRAMUSCULAR | Status: DC | PRN

## 2017-08-07 MED ORDER — ONDANSETRON HCL 4 MG/2ML IJ SOLN: 4.0000 mg | Freq: Four times a day (QID) | INTRAMUSCULAR | Status: DC | PRN

## 2017-08-07 MED ORDER — INSULIN ASPART 100 UNIT/ML ~~LOC~~ SOLN
0.0000 [IU] | SUBCUTANEOUS | Status: DC
  Administered 2017-08-07: 7 [IU] via SUBCUTANEOUS
  Administered 2017-08-07 (×3): 11 [IU] via SUBCUTANEOUS
  Administered 2017-08-08 (×2): 4 [IU] via SUBCUTANEOUS
  Administered 2017-08-08: 7 [IU] via SUBCUTANEOUS

## 2017-08-07 MED ORDER — FENTANYL CITRATE (PF) 100 MCG/2ML IJ SOLN
INTRAMUSCULAR | Status: DC | PRN
  Administered 2017-08-07: 100 ug via INTRAVENOUS
  Administered 2017-08-07: 50 ug via INTRAVENOUS
  Administered 2017-08-07: 100 ug via INTRAVENOUS

## 2017-08-07 MED ORDER — CEFOTETAN DISODIUM-DEXTROSE 2-2.08 GM-%(50ML) IV SOLR
INTRAVENOUS | Status: AC
  Filled 2017-08-07: qty 50

## 2017-08-07 MED ORDER — HYDROMORPHONE HCL 1 MG/ML IJ SOLN: 0.2500 mg | INTRAMUSCULAR | Status: DC | PRN

## 2017-08-07 MED ORDER — DIPHENHYDRAMINE HCL 50 MG/ML IJ SOLN: 12.5000 mg | Freq: Three times a day (TID) | INTRAMUSCULAR | Status: DC | PRN

## 2017-08-07 SURGICAL SUPPLY — 63 items
APPLICATOR COTTON TIP 6IN STRL (MISCELLANEOUS) ×4 IMPLANT
APPLIER CLIP ROT 13.4 12 LRG (CLIP)
BLADE SURG SZ11 CARB STEEL (BLADE) ×2 IMPLANT
CABLE HIGH FREQUENCY MONO STRZ (ELECTRODE) IMPLANT
CHLORAPREP W/TINT 26ML (MISCELLANEOUS) ×2 IMPLANT
CLIP APPLIE ROT 13.4 12 LRG (CLIP) IMPLANT
CLIP SUT LAPRA TY ABSORB (SUTURE) ×4 IMPLANT
CUTTER FLEX LINEAR 45M (STAPLE) IMPLANT
DERMABOND ADVANCED (GAUZE/BANDAGES/DRESSINGS) ×1
DERMABOND ADVANCED .7 DNX12 (GAUZE/BANDAGES/DRESSINGS) ×1 IMPLANT
DEVICE SUT QUICK LOAD TK 5 (STAPLE) IMPLANT
DEVICE SUT TI-KNOT TK 5X26 (MISCELLANEOUS) IMPLANT
DEVICE SUTURE ENDOST 10MM (ENDOMECHANICALS) ×2 IMPLANT
DRAIN PENROSE 18X1/4 LTX STRL (WOUND CARE) ×2 IMPLANT
ELECT REM PT RETURN 15FT ADLT (MISCELLANEOUS) ×2 IMPLANT
GAUZE SPONGE 4X4 12PLY STRL (GAUZE/BANDAGES/DRESSINGS) IMPLANT
GAUZE SPONGE 4X4 16PLY XRAY LF (GAUZE/BANDAGES/DRESSINGS) ×2 IMPLANT
GLOVE BIO SURGEON STRL SZ7.5 (GLOVE) IMPLANT
GLOVE INDICATOR 8.0 STRL GRN (GLOVE) ×2 IMPLANT
GOWN STRL REUS W/TWL XL LVL3 (GOWN DISPOSABLE) ×8 IMPLANT
HOVERMATT SINGLE USE (MISCELLANEOUS) ×2 IMPLANT
KIT BASIN OR (CUSTOM PROCEDURE TRAY) ×2 IMPLANT
KIT GASTRIC LAVAGE 34FR ADT (SET/KITS/TRAYS/PACK) ×2 IMPLANT
LUBRICANT JELLY K Y 4OZ (MISCELLANEOUS) ×2 IMPLANT
MARKER SKIN DUAL TIP RULER LAB (MISCELLANEOUS) ×2 IMPLANT
NEEDLE SPNL 22GX3.5 QUINCKE BK (NEEDLE) ×2 IMPLANT
PACK CARDIOVASCULAR III (CUSTOM PROCEDURE TRAY) ×2 IMPLANT
RELOAD 45 VASCULAR/THIN (ENDOMECHANICALS) IMPLANT
RELOAD ENDO STITCH 2.0 (ENDOMECHANICALS) ×8
RELOAD STAPLE TA45 3.5 REG BLU (ENDOMECHANICALS) IMPLANT
RELOAD STAPLER BLUE 60MM (STAPLE) ×4 IMPLANT
RELOAD STAPLER GOLD 60MM (STAPLE) ×1 IMPLANT
RELOAD STAPLER WHITE 60MM (STAPLE) ×2 IMPLANT
SCISSORS LAP 5X45 EPIX DISP (ENDOMECHANICALS) ×2 IMPLANT
SET IRRIG TUBING LAPAROSCOPIC (IRRIGATION / IRRIGATOR) ×2 IMPLANT
SHEARS HARMONIC ACE PLUS 45CM (MISCELLANEOUS) ×2 IMPLANT
SLEEVE XCEL OPT CAN 5 100 (ENDOMECHANICALS) ×2 IMPLANT
SOLUTION ANTI FOG 6CC (MISCELLANEOUS) ×2 IMPLANT
STAPLER ECHELON BIOABSB 60 FLE (MISCELLANEOUS) IMPLANT
STAPLER ECHELON LONG 60 440 (INSTRUMENTS) ×2 IMPLANT
STAPLER RELOAD BLUE 60MM (STAPLE) ×8
STAPLER RELOAD GOLD 60MM (STAPLE) ×2
STAPLER RELOAD WHITE 60MM (STAPLE) ×4
SUT MNCRL AB 4-0 PS2 18 (SUTURE) ×4 IMPLANT
SUT RELOAD ENDO STITCH 2 48X1 (ENDOMECHANICALS) ×4
SUT RELOAD ENDO STITCH 2.0 (ENDOMECHANICALS) ×4
SUT SURGIDAC NAB ES-9 0 48 120 (SUTURE) IMPLANT
SUT VIC AB 2-0 SH 27 (SUTURE) ×1
SUT VIC AB 2-0 SH 27X BRD (SUTURE) ×1 IMPLANT
SUTURE RELOAD END STTCH 2 48X1 (ENDOMECHANICALS) ×4 IMPLANT
SUTURE RELOAD ENDO STITCH 2.0 (ENDOMECHANICALS) ×4 IMPLANT
SYR 10ML ECCENTRIC (SYRINGE) ×2 IMPLANT
SYR 20CC LL (SYRINGE) ×4 IMPLANT
TIP RIGID 35CM EVICEL (HEMOSTASIS) ×2 IMPLANT
TOWEL OR 17X26 10 PK STRL BLUE (TOWEL DISPOSABLE) ×2 IMPLANT
TOWEL OR NON WOVEN STRL DISP B (DISPOSABLE) ×2 IMPLANT
TRAY FOLEY W/METER SILVER 16FR (SET/KITS/TRAYS/PACK) ×2 IMPLANT
TROCAR BLADELESS OPT 5 100 (ENDOMECHANICALS) ×2 IMPLANT
TROCAR UNIVERSAL OPT 12M 100M (ENDOMECHANICALS) ×6 IMPLANT
TROCAR XCEL 12X100 BLDLESS (ENDOMECHANICALS) ×2 IMPLANT
TUBING CONNECTING 10 (TUBING) IMPLANT
TUBING ENDO SMARTCAP PENTAX (MISCELLANEOUS) ×2 IMPLANT
TUBING INSUF HEATED (TUBING) ×2 IMPLANT

## 2017-08-07 NOTE — Transfer of Care (Signed)
Immediate Anesthesia Transfer of Care Note  Patient: Amber Stanley  Procedure(s) Performed: LAPAROSCOPIC ROUX-EN-Y GASTRIC BYPASS WITH UPPER ENDOSCOPY (N/A Abdomen)  Patient Location: PACU  Anesthesia Type:General  Level of Consciousness: awake, alert  and oriented  Airway & Oxygen Therapy: Patient Spontanous Breathing and Patient connected to face mask oxygen  Post-op Assessment: Report given to RN and Post -op Vital signs reviewed and stable  Post vital signs: Reviewed and stable  Last Vitals:  Vitals:   08/07/17 0520  BP: 136/80  Pulse: 83  Resp: 16  Temp: 36.6 C  SpO2: 100%    Last Pain:  Vitals:   08/07/17 0538  TempSrc:   PainSc: 1       Patients Stated Pain Goal: 4 (77/11/65 [id number])  Complications: No apparent anesthesia complications

## 2017-08-07 NOTE — Interval H&P Note (Signed)
History and Physical Interval Note:  08/07/2017 7:12 AM  Amber Stanley  has presented today for surgery, with the diagnosis of Morbid Obesity, DM II, Asthma  The various methods of treatment have been discussed with the patient and family. After consideration of risks, benefits and other options for treatment, the patient has consented to  Procedure(s): LAPAROSCOPIC ROUX-EN-Y GASTRIC BYPASS WITH UPPER ENDOSCOPY (N/A) as a surgical intervention .  The patient's history has been reviewed, patient examined, no change in status, stable for surgery.  I have reviewed the patient's chart and labs.  Questions were answered to the patient's satisfaction.    [name]. Redmond Pulling, MD, FACS General, Bariatric, & Minimally Invasive Surgery [facility] [place]   [place]kerel

## 2017-08-07 NOTE — Op Note (Signed)
Upper GI endoscopy is performed at the completion of laparoscopic Roux-en-Y gastric bypass by Dr. Redmond Pulling. The Olympus video endoscope was inserted into the upper esophagus and then passed under direct vision to the EG junction at about 44cm.. The small gastric pouch was insufflated with air while the gastric outlet was clamped under irrigation by the operating surgeon. There was no evidence of leak. The anastomosis was visualized and was patent. Suture and staple lines were intact and without bleeding. The pouch was tubular and measured 5 cm in length. At the completion of the procedure the pouch was desufflated and the scope withdrawn.  Amber Jolly MD, FACS  08/07/2017, 9:56 AM

## 2017-08-07 NOTE — Progress Notes (Signed)
Discussed post op day goals with patient including ambulation, IS, diet progression, pain, and nausea control.  Questions answered. 

## 2017-08-07 NOTE — Anesthesia Procedure Notes (Signed)
Procedure Name: Intubation Date/Time: 08/07/2017 7:35 AM Performed by: Glory Buff, CRNA Pre-anesthesia Checklist: Patient identified, Emergency Drugs available, Suction available and Patient being monitored Patient Re-evaluated:Patient Re-evaluated prior to induction Oxygen Delivery Method: Circle system utilized Preoxygenation: Pre-oxygenation with 100% oxygen Induction Type: IV induction Ventilation: Mask ventilation without difficulty Laryngoscope Size: Miller and 3 Grade View: Grade I Tube type: Oral Tube size: 7.0 mm Number of attempts: 1 Airway Equipment and Method: Stylet and Oral airway Placement Confirmation: ETT inserted through vocal cords under direct vision,  positive ETCO2 and breath sounds checked- equal and bilateral Secured at: 21 cm Tube secured with: Tape Dental Injury: Teeth and Oropharynx as per pre-operative assessment

## 2017-08-07 NOTE — Anesthesia Postprocedure Evaluation (Signed)
Anesthesia Post Note  Patient: Amber Stanley  Procedure(s) Performed: LAPAROSCOPIC ROUX-EN-Y GASTRIC BYPASS WITH UPPER ENDOSCOPY (N/A Abdomen)     Patient location during evaluation: PACU Anesthesia Type: General Level of consciousness: awake and alert Pain management: pain level controlled Vital Signs Assessment: post-procedure vital signs reviewed and stable Respiratory status: spontaneous breathing, nonlabored ventilation, respiratory function stable and patient connected to nasal cannula oxygen Cardiovascular status: blood pressure returned to baseline and stable Postop Assessment: no apparent nausea or vomiting Anesthetic complications: no    Last Vitals:  Vitals:   08/07/17 1114 08/07/17 1127  BP: (!) 144/89 133/86  Pulse: 85 85  Resp: 12 12  Temp:  37.2 C  SpO2: 94% 97%    Last Pain:  Vitals:   08/07/17 1146  TempSrc:   PainSc: 3                  Chundra Sauerwein DAVID

## 2017-08-07 NOTE — Op Note (Signed)
Amber Stanley [id number] [birth year]. 08/07/2017  Preoperative diagnosis:    Morbid obesity BMI 38   High cholesterol   Type 2 diabetes mellitus (HCC)   GERD (gastroesophageal reflux disease)   PCOS (polycystic ovarian syndrome)   Essential hypertension   Bilateral chronic knee pain   Postoperative  diagnosis:  1. same  Surgical procedure: Laparoscopic Roux-en-Y gastric bypass (ante-colic, ante-gastric); upper endoscopy  Surgeon: Gayland Curry, M.D. FACS  Asst.: Excell Seltzer MD FACS  Anesthesia: General plus exparel  Complications: None   EBL: Minimal   Drains: None   Disposition: PACU in good condition   Indications for procedure: 46yo female with morbid obesity who has been unsuccessful at sustained weight loss. The patient's comorbidities are listed above. We discussed the risk and benefits of surgery including but not limited to anesthesia risk, bleeding, infection, blood clot formation, anastomotic leak, anastomotic stricture, ulcer formation, death, respiratory complications, intestinal blockage, internal hernia, gallstone formation, vitamin and nutritional deficiencies, injury to surrounding structures, failure to lose weight and mood changes.   Description of procedure: Patient is brought to the operating room and general anesthesia induced. The patient had received preoperative broad-spectrum IV antibiotics and subcutaneous heparin. The abdomen was widely sterilely prepped with Chloraprep and draped. Patient timeout was performed and correct patient and procedure confirmed. Access was obtained with a 12 mm Optiview trocar in the left upper quadrant and pneumoperitoneum established without difficulty. Under direct vision 12 mm trocars were placed laterally in the right upper quadrant, right upper quadrant midclavicular line, and to the left and above the umbilicus for the camera port. A 5 mm trocar was placed laterally in the left upper quadrant.  The omentum was  brought into the upper abdomen after taking down a few omental adhesions to the anterior abdominal wall and the transverse mesocolon elevated and the ligament of Treitz clearly identified. A 40 cm biliopancreatic limb was then carefully measured from the ligament of Treitz. The small intestine was divided at this point with a single firing of the white load linear stapler. A Penrose drain was sutured to the end of the Roux-en-Y limb for later identification. A 100 cm Roux-en-Y limb was then carefully measured. At this point a side-to-side anastomosis was created between the Roux limb and the end of the biliopancreatic limb. This was accomplished with a single firing of the 60 mm white load linear stapler. The common enterotomy was closed with a running 2-0 Vicryl begun at either end of the enterotomy and tied centrally. Eviceal tissue sealant was placed over the anastomosis. The mesenteric defect was then closed with running 2-0 silk. The omentum was then divided with the harmonic scalpel up towards the transverse colon to allow mobility of the Roux limb toward the gastric pouch. The patient was then placed in steep reversed Trendelenburg. Through a 5 mm subxiphoid site the [facility] retractor was placed and the left lobe of the liver elevated with excellent exposure of the upper stomach and hiatus. The angle of Hiss was then mobilized with the harmonic scalpel. A 4 cm gastric pouch was then carefully measured along the lesser curve of the stomach. Dissection was carried along the lesser curve at this point with the Harmonic scalpel working carefully back toward the lesser sac at right angles to the lesser curve. The free lesser sac was then entered. After being sure all tubes were removed from the stomach an initial firing of the gold load 60 mm linear stapler was fired at right angles across  the lesser curve for about 4 cm. The gastric pouch was further mobilized posteriorly and then the pouch was completed with 3  further firings of the 60 mm blue load linear stapler up through the previously dissected angle of His. It was ensured that the pouch was completely mobilized away from the gastric remnant. This created a nice tubular 4-5 cm gastric pouch. The Roux limb was then brought up in an antecolic fashion with the candycane facing to the patient's left without undue tension. The gastrojejunostomy was created with an initial posterior row of 2-0 Vicryl between the Roux limb and the staple line of the gastric pouch. Enterotomies were then made in the gastric pouch and the Roux limb with the harmonic scalpel and at approximately 2-2-1/2 cm anastomosis was created with a single firing of the 76mm blue load linear stapler. The staple line was inspected and was intact without bleeding. The common enterotomy was then closed with running 2-0 Vicryl begun at either end and tied centrally. The Ewall tube was then easily passed through the anastomosis and an outer anterior layer of running 2-0 Vicryl was placed. The Ewald tube was removed. With the outlet of the gastrojejunostomy clamped and under saline irrigation the assistant performed upper endoscopy and with the gastric pouch tensely distended with air-there was no evidence of leak on this test. The pouch was desufflated. The Terance Hart defect was closed with running 2-0 silk. The abdomen was inspected for any evidence of bleeding or bowel injury and everything looked fine. The Nathanson retractor was removed under direct vision after coating the anastomosis with Eviceal tissue sealant. All CO2 was evacuated and trochars removed. Skin incisions were closed with 4-0 monocryl in a subcuticular fashion followed by Dermabond. Sponge needle and instrument counts were correct. The patient was taken to the PACU in good condition.    [name]. Redmond Pulling, MD, FACS General, Bariatric, & Minimally Invasive Surgery [facility], [place]

## 2017-08-08 ENCOUNTER — Encounter (HOSPITAL_COMMUNITY): Payer: Self-pay | Admitting: General Surgery

## 2017-08-08 LAB — GLUCOSE, CAPILLARY
GLUCOSE-CAPILLARY: 258 mg/dL — AB (ref 65–99)
GLUCOSE-CAPILLARY: 203 mg/dL — AB (ref 65–99)
GLUCOSE-CAPILLARY: 185 mg/dL — AB (ref 65–99)
GLUCOSE-CAPILLARY: 187 mg/dL — AB (ref 65–99)

## 2017-08-08 LAB — CBC WITH DIFFERENTIAL/PLATELET
Basophils Absolute: 0 10*3/uL (ref 0.0–0.1)
Basophils Relative: 0 %
EOS ABS: 0 10*3/uL (ref 0.0–0.7)
Eosinophils Relative: 0 %
HEMATOCRIT: 33.2 % — AB (ref 36.0–46.0)
HEMOGLOBIN: 10.2 g/dL — AB (ref 12.0–15.0)
LYMPHS ABS: 2 10*3/uL (ref 0.7–4.0)
LYMPHS PCT: 16 %
MCH: 24.5 pg — AB (ref 26.0–34.0)
MCHC: 30.7 g/dL (ref 30.0–36.0)
MCV: 79.6 fL (ref 78.0–100.0)
MONOS PCT: 9 %
Monocytes Absolute: 1.2 10*3/uL — ABNORMAL HIGH (ref 0.1–1.0)
NEUTROS PCT: 75 %
Neutro Abs: 9.6 10*3/uL — ABNORMAL HIGH (ref 1.7–7.7)
Platelets: 302 10*3/uL (ref 150–400)
RBC: 4.17 MIL/uL (ref 3.87–5.11)
RDW: 15.4 % (ref 11.5–15.5)
WBC: 12.8 10*3/uL — AB (ref 4.0–10.5)

## 2017-08-08 LAB — COMPREHENSIVE METABOLIC PANEL
ALK PHOS: 49 U/L (ref 38–126)
ALT: 100 U/L — AB (ref 14–54)
ANION GAP: 7 (ref 5–15)
AST: 117 U/L — ABNORMAL HIGH (ref 15–41)
Albumin: 3.5 g/dL (ref 3.5–5.0)
BILIRUBIN TOTAL: 0.8 mg/dL (ref 0.3–1.2)
BUN: 7 mg/dL (ref 6–20)
CALCIUM: 8.4 mg/dL — AB (ref 8.9–10.3)
CO2: 25 mmol/L (ref 22–32)
CREATININE: 0.54 mg/dL (ref 0.44–1.00)
Chloride: 105 mmol/L (ref 101–111)
Glucose, Bld: 207 mg/dL — ABNORMAL HIGH (ref 65–99)
Potassium: 3.9 mmol/L (ref 3.5–5.1)
SODIUM: 137 mmol/L (ref 135–145)
TOTAL PROTEIN: 6.5 g/dL (ref 6.5–8.1)

## 2017-08-08 MED ORDER — INSULIN LISPRO 100 UNIT/ML (KWIKPEN): 0.0000 [IU] | PEN_INJECTOR | Freq: Three times a day (TID) | SUBCUTANEOUS | 5 refills | Status: DC

## 2017-08-08 MED ORDER — OXYCODONE HCL 5 MG/5ML PO SOLN: 5.0000 mg | ORAL | 0 refills | Status: DC | PRN

## 2017-08-08 NOTE — Progress Notes (Signed)
Patient is alert and oriented.  Tolerated fluids and advanced to protein shake. Discharge instruction given. IV canula removed. Denies pain. Patient is ambulating satisfactory.

## 2017-08-08 NOTE — Plan of Care (Signed)

## 2017-08-08 NOTE — Progress Notes (Signed)
Patient alert and oriented, pain is controlled. Patient is tolerating fluids, advanced to protein shake today, patient is tolerating well. Reviewed Gastric Bypass discharge instructions with patient and patient is able to articulate understanding. Provided information on BELT program, Support Group and [facility] outpatient pharmacy. All questions answered, will continue to monitor.    

## 2017-08-08 NOTE — Progress Notes (Signed)
Patient alert and oriented, Post op day 1.  Provided support and encouragement.  Encouraged pulmonary toilet, ambulation and small sips of liquids. Completed 12 ounces of clear fluids and transitioned to protein.   All questions answered.  Will continue to monitor.

## 2017-08-08 NOTE — Progress Notes (Signed)
Inpatient Diabetes Program Recommendations  AACE/ADA: New Consensus Statement on Inpatient Glycemic Control (2015)  Target Ranges:  Prepandial:   less than 140 mg/dL      Peak postprandial:   less than 180 mg/dL (1-2 hours)      Critically ill patients:  140 - 180 mg/dL   Lab Results  Component Value Date   GLUCAP 187 (H) 08/08/2017    Review of Glycemic Control  Diabetes history: DM2 Outpatient Diabetes medications: Insulin pump with Humalog Current orders for Inpatient glycemic control: Novolog 0-20 units Q4H  Inpatient Diabetes Program Recommendations:    For Home:  Humalog 0-20 units tidwc and hs (4x/day to check blood sugars) Instructed to call endo if blood sugars > 200 for multiple checks. Will not restart insulin pump.  Pt voiced understanding and agrees with plan. Pt has f/u appt with endo in January 2019.  Thank you. Lorenda Peck, RD, LDN, CDE Inpatient Diabetes Coordinator [phone]

## 2017-08-08 NOTE — Discharge Instructions (Signed)
Humalog 0-20 units three times a day with meals and bedtime (4x/day to check blood sugars) Instructed to call endo if blood sugars > 200 for multiple checks. Will not restart insulin pump.   For blood sugars: 0 to 120           give 0 units Humaolog 121 to 150       Give 3 units 151 to 200       Give 4 units 201 to 250       Give 7 units 251 to 300       Give  11 units 301 to 350       Give 15 units 351 to 400       Give 20 units >400                Call Doctor       GASTRIC BYPASS/SLEEVE  Home Care Instructions   These instructions are to help you care for yourself when you go home.  Call: If you have any problems.  Call [phone] and ask for the surgeon on call  If you need immediate help, come to the ER at [facility].   Tell the ER staff that you are a new post-op gastric bypass or gastric sleeve patient   Signs and symptoms to report:  Severe vomiting or nausea o If you cannot keep down clear liquids for longer than 1 day, call your surgeon   Abdominal pain that does not get better after taking your pain medication  Fever over 100.4 F with chills  Heart beating over 100 beats a minute  Shortness of breath at rest  Chest pain   Redness, swelling, drainage, or foul odor at incision (surgical) sites   If your incisions open or pull apart  Swelling or pain in calf (lower leg)  Diarrhea (Loose bowel movements that happen often), frequent watery, uncontrolled bowel movements  Constipation, (no bowel movements for 3 days) if this happens: Pick one o Milk of Magnesia, 2 tablespoons by mouth, 3 times a day for 2 days if needed o Stop taking Milk of Magnesia once you have a bowel movement o Call your doctor if constipation continues Or o Miralax  (instead of Milk of Magnesia) following the label instructions o Stop taking Miralax once you have a bowel movement o Call your doctor if constipation continues  Anything you think is not normal   Normal side  effects after surgery:  Unable to sleep at night or unable to focus  Irritability or moody  Being tearful (crying) or depressed These are common complaints, possibly related to your anesthesia medications that put you to sleep, stress of surgery, and change in lifestyle.  This usually goes away a few weeks after surgery.  If these feelings continue, call your primary care doctor.   Wound Care: You may have surgical glue, steri-strips, or staples over your incisions after surgery  Surgical glue:  Looks like a clear film over your incisions and will wear off a little at a time  Steri-strips: Strips of tape over your incisions. You may notice a yellowish color on the skin under the steri-strips. This is used to make the   steri-strips stick better. Do not pull the steri-strips off - let them fall off  Staples: Staples may be removed before you leave the hospital o If you go home with staples, call [facility], [phone]) [phone] at for an appointment with your surgeons nurse to have staples  removed 10 days after surgery.  Showering: You may shower two (2) days after your surgery unless your surgeon tells you differently o Wash gently around incisions with warm soapy water, rinse well, and gently pat dry  o No tub baths until staples are removed, steri-strips fall off or glue is gone.    Medications:  Medications should be liquid or crushed if larger than the size of a dime  Extended release pills (medication that release a little bit at a time through the day) should NOT be crushed or cut. (examples include XL, ER, DR, SR)  Depending on the size and number of medications you take, you may need to space (take a few throughout the day)/change the time you take your medications so that you do not over-fill your pouch (smaller stomach)  Make sure you follow-up with your primary care doctor to make medication changes needed during rapid weight loss and life-style changes  If you have  diabetes, follow up with the doctor that orders your diabetes medication(s) within one week after surgery and check your blood sugar regularly.  Do not drive while taking prescription pain medication   It is ok to take Tylenol by the bottle instructions with your pain medicine or instead of your pain medicine as needed.  DO NOT TAKE NSAIDS (EXAMPLES OF NSAIDS:  IBUPROFREN/ NAPROXEN)  Diet:                    First 2 Weeks  You will see the dietician t about two (2) weeks after your surgery. The dietician will increase the types of foods you can eat if you are handling liquids well:  If you have severe vomiting or nausea and cannot keep down clear liquids lasting longer than 1 day, call your surgeon @ [phone]) Protein Shake  Drink at least 2 ounces of shake 5-6 times per day  Each serving of protein shakes (usually 8 - 12 ounces) should have: o 15 grams of protein  o And no more than 5 grams of carbohydrate   Goal for protein each day: o Men = 80 grams per day o Women = 60 grams per day  Protein powder may be added to fluids such as non-fat milk or Lactaid milk or unsweetened Soy/Almond milk (limit to 35 grams added protein powder per serving)  Hydration  Slowly increase the amount of water and other clear liquids as tolerated (See Acceptable Fluids)  Slowly increase the amount of protein shake as tolerated    Sip fluids slowly and throughout the day.  Do not use straws.  May use sugar substitutes in small amounts (no more than 6 - 8 packets per day; i.e. Splenda)  Fluid Goal  The first goal is to drink at least 8 ounces of protein shake/drink per day (or as directed by the nutritionist); some examples of protein shakes are [employer] Johnson, [employer]R [employer], EAS Edge HP, and [place]jury. See handout from pre-op Bariatric Education Class: o Slowly increase the amount of protein shake you drink as tolerated o You may find it easier to slowly sip shakes throughout the day o It is  important to get your proteins in first  Your fluid goal is to drink 64 - 100 ounces of fluid daily o It may take a few weeks to build up to this  32 oz (or more) should be clear liquids  And   32 oz (or more) should be full liquids (see below for examples)  Liquids should  not contain sugar, caffeine, or carbonation  Clear Liquids:  Water or Sugar-free flavored water (i.e. Fruit H2O, Propel)  Decaffeinated coffee or tea (sugar-free)  Crystal Lite, Wylers Lite, Minute Maid Lite  Sugar-free Jell-O  Bouillon or broth  Sugar-free Popsicle:   *Less than 20 calories each; Limit 1 per day  Full Liquids: Protein Shakes/Drinks + 2 choices per day of other full liquids  Full liquids must be: o No More Than 15 grams of Carbs per serving  o No More Than 3 grams of Fat per serving  Strained low-fat cream soup (except Cream of Potato or Tomato)  Non-Fat milk  Fat-free Lactaid Milk  Unsweetened Soy Or Unsweetened Almond Milk  Low Sugar yogurt (Dannon Lite & Fit, Greek yogurt; Oikos Triple Zero; Chobani Simply 100; Yoplait 100 calorie [place] - No Fruit on the Bottom)    Vitamins and Minerals  Start 1 day after surgery unless otherwise directed by your surgeon  2 Chewable Bariatric Specific Multivitamin / Multimineral Supplement with iron (Example: Bariatric Advantage Multi EA)  Chewable Calcium with Vitamin D-3 (Example: 3 Chewable Calcium Plus 600 with Vitamin D-3) o Take 500 mg three (3) times a day for a total of 1500 mg each day o Do not take all 3 doses of calcium at one time as it may cause constipation, and you can only absorb 500 mg  at a time  o Do not mix multivitamins containing iron with calcium supplements; take 2 hours apart  Menstruating women and those with a history of anemia (a blood disease that causes weakness) may need extra iron o Talk with your doctor to see if you need more iron  Do not stop taking or change any vitamins or minerals until you talk to  your dietitian or surgeon  Your Dietitian and/or surgeon must approve all vitamin and mineral supplements   Activity and Exercise: Limit your physical activity as instructed by your doctor.  It is important to continue walking at home.  During this time, use these guidelines:  Do not lift anything greater than ten (10) pounds for at least two (2) weeks  Do not go back to work or drive until Engineer, production says you can  You may have sex when you feel comfortable  o It is VERY important for female patients to use a reliable birth control method; fertility often increases after surgery  o All hormonal birth control will be ineffective for 30 days after surgery due to medications given during surgery a barrier method must be used. o Do not get pregnant for at least 18 months  Start exercising as soon as your doctor tells you that you can o Make sure your doctor approves any physical activity  Start with a simple walking program  Walk 5-15 minutes each day, 7 days per week.   Slowly increase until you are walking 30-45 minutes per day Consider joining our [place] program. [phone] or email belt@[employer] .edu   Special Instructions Things to remember:  Use your CPAP when sleeping if this applies to you   [facility] has two free Bariatric Surgery Support Groups that meet monthly o The 3rd Thursday of each month, 6 pm, [facility]  o The 2nd Friday of each month, 11:45 am in the private dining room in the basement of [place]  It is very important to keep all follow up appointments with your surgeon, dietitian, primary care physician, and behavioral health practitioner  Routine follow up schedule  with your surgeon include appointments at 2-3 weeks, 6-8 weeks, 6 months, and 1 year at a minimum.  Your surgeon may request to see you more often.   o After the first year, please follow up with your bariatric surgeon and dietitian at least once a year in  order to maintain best weight loss results [facility]: [place]: [phone] Bariatric Nurse Coordinator: [phone]      Reviewed and Endorsed  by [facility] Patient Education Committee, June, 2016 Edits Approved: Aug, 2018

## 2017-08-08 NOTE — Progress Notes (Signed)
Patient ID: Amber Stanley, female   DOB: 11/16/1970, 46 y.o.   MRN: [id number]   Progress Note: Metabolic and Bariatric Surgery Service   Chief Complaint/Subjective: Some gas pain in abd. No n/v. Ambualted. Water went well  Objective: Vital signs in last 24 hours: Temp:  [98.3 F (36.8 C)-99.2 F (37.3 C)] 98.5 F (36.9 C) (12/19 0600) Pulse Rate:  [71-91] 71 (12/19 0600) Resp:  [12-16] 16 (12/19 0600) BP: (110-157)/(71-89) 128/82 (12/19 0600) SpO2:  [92 %-100 %] 100 % (12/19 0600) Weight:  [115 kg (253 lb 8.5 oz)] 115 kg (253 lb 8.5 oz) (12/19 0600) Last BM Date: 08/06/17  Intake/Output from previous day: 12/18 0701 - 12/19 0700 In: 4570.8 [P.O.:600; I.V.:3920.8; IV Piggyback:50] Out: 4765 [Urine:3175; Blood:50] Intake/Output this shift: No intake/output data recorded.  Lungs: cta, symm  Cardiovascular: reg  Abd: soft, min ttp, incisoins ok  Extremities: no edema, +SCDs  Neuro: nonfocal, alert, nontoxic  Lab Results: CBC  Recent Labs    08/07/17 1037 08/08/17 0437  WBC  --  12.8*  HGB 11.0* 10.2*  HCT 35.4* 33.2*  PLT  --  302   BMET Recent Labs    08/08/17 0437  NA 137  K 3.9  CL 105  CO2 25  GLUCOSE 207*  BUN 7  CREATININE 0.54  CALCIUM 8.4*   PT/INR No results for input(s): LABPROT, INR in the last 72 hours. ABG No results for input(s): PHART, HCO3 in the last 72 hours.  Invalid input(s): PCO2, PO2  Studies/Results:  Anti-infectives: Anti-infectives (From admission, onward)   Start     Dose/Rate Route Frequency Ordered Stop   08/07/17 0651  cefoTEtan in Dextrose 5% (CEFOTAN) 2-2.08 GM-%(50ML) IVPB    Comments:  Chrystine Oiler: cabinet override      08/07/17 0651 08/07/17 0736   08/07/17 0518  cefoTEtan in Dextrose 5% (CEFOTAN) IVPB 2 g     2 g Intravenous On call to O.R. 08/07/17 0518 08/07/17 0806      Medications: Scheduled Meds: . acetaminophen (TYLENOL) oral liquid 160 mg/5 mL  650 mg Oral Q6H  . enoxaparin (LOVENOX)  injection  30 mg Subcutaneous Q12H  . fluticasone  1 spray Each Nare Daily  . gabapentin  200 mg Oral Q12H  . insulin aspart  0-20 Units Subcutaneous Q4H  . metoprolol succinate  50 mg Oral Daily  . pantoprazole (PROTONIX) IV  40 mg Intravenous QHS  . [START ON 08/09/2017] protein supplement shake  2 oz Oral Q2H   Continuous Infusions: . 0.45 % NaCl with KCl 20 mEq / L 125 mL/hr at 08/08/17 0408   PRN Meds:.albuterol, diphenhydrAMINE, enalaprilat, morphine injection, ondansetron (ZOFRAN) IV, oxyCODONE, promethazine, simethicone  Assessment/Plan: Patient Active Problem List   Diagnosis Date Noted  . Essential hypertension 08/07/2017  . Bilateral chronic knee pain 08/07/2017  . First degree hemorrhoids   . Diverticulosis of large intestine without diverticulitis   . Iron deficiency anemia 08/15/2016  . Hx of microcytic hypochromic anemia 08/10/2016  . Closed fracture of right wrist 04/17/2016  . Preventative health care 01/19/2016  . Morbid obesity ([place]) 08/26/2015  . Hypothyroidism 07/03/2011  . High cholesterol 07/03/2011  . Type 2 diabetes mellitus ([place]) 07/03/2011  . GERD (gastroesophageal reflux disease) 07/03/2011  . History of asthma 07/03/2011  . PCOS (polycystic ovarian syndrome) 07/03/2011   s/p Procedure(s): LAPAROSCOPIC ROUX-EN-Y GASTRIC BYPASS WITH UPPER ENDOSCOPY 08/07/2017  Looks good. No fever. No tachy.  Cont diet Diabetes consult for dc med recs Cont  vte prophylaxis  Disposition:  LOS: 1 day  The patient Stanley be in the hospital for normal postop protocol  Greer Pickerel, MD [phone] [facility]

## 2017-08-08 NOTE — Discharge Summary (Signed)
Physician Discharge Summary  Amber Stanley GGE:[id number] DOB: Mar 13, 1971 DOA: 08/07/2017  PCP: Arnetha Courser, MD  Admit date: 08/07/2017 Discharge date:  08/08/2017   Recommendations for Outpatient Follow-up:    Follow-up Information    Greer Pickerel, MD. Go on 08/31/2017.   Specialty:  General Surgery Why:  [address] information: [place] [address] [phone]        Greer Pickerel, MD Follow up.   Specialty:  General Surgery Contact information: [place] [address] [phone]          Discharge Diagnoses:  Principal Problem:   Morbid obesity ([place]) Active Problems:   High cholesterol   Type 2 diabetes mellitus (H[facility])   GERD (gastroesophageal reflux disease)   PCOS (polycystic ovarian syndrome)   Essential hypertension   Bilateral chronic knee pain   Surgical Procedure: Laparoscopic Roux-en-Y gastric bypass, upper endoscopy  Discharge Condition: Good Disposition: Home  Diet recommendation: Postoperative gastric bypass diet  Filed Weights   08/07/17 0544 08/08/17 0600  Weight: 113.8 kg (250 lb 12.8 oz) 115 kg (253 lb 8.5 oz)     Hospital Course:  The patient was admitted for a planned laparoscopic Roux-en-Y gastric bypass. Please see operative note. Preoperatively the patient was given 5000 units of subcutaneous heparin for DVT prophylaxis. ERAS protocol was used. Postoperative prophylactic Lovenox dosing was started on the evening of postoperative day 0.  The patient was started on ice chips and water on the evening of POD 0 which they tolerated. On postoperative day 1 The patient's diet was advanced to protein shakes which they also tolerated.  Diabetes management saw pt and left recommendations.  The patient was ambulating without difficulty. Their vital signs are stable without fever or tachycardia. Their hemoglobin had remained stable. The patient had received discharge instructions and  counseling. They were deemed stable for discharge.   Discharge Instructions  Discharge Instructions    Ambulate hourly while awake   Complete by:  As directed    Call MD for:  difficulty breathing, headache or visual disturbances   Complete by:  As directed    Call MD for:  persistant dizziness or light-headedness   Complete by:  As directed    Call MD for:  persistant nausea and vomiting   Complete by:  As directed    Call MD for:  redness, tenderness, or signs of infection (pain, swelling, redness, odor or green/yellow discharge around incision site)   Complete by:  As directed    Call MD for:  severe uncontrolled pain   Complete by:  As directed    Call MD for:  temperature >101 F   Complete by:  As directed    Diet bariatric full liquid   Complete by:  As directed    Discharge instructions   Complete by:  As directed    See bariatric discharge instructions:  For blood sugars: 0 to 120           give 0 units humalog 121 to 150       Give 3 units 151 to 200       Give 4 units 201 to 250       Give 7 units 251 to 300       Give  11 units 301 to 350       Give 15 units 351 to 400       Give 20 units >400  Call Doctor   Incentive spirometry   Complete by:  As directed    Perform hourly while awake     Allergies as of 08/08/2017      Reactions   Adhesive [tape] Itching   Skin peels off       Medication List    STOP taking these medications   FIFTY50 PEN NEEDLES 32G X 4 MM Misc Generic drug:  Insulin Pen Needle   HUMALOG 100 UNIT/ML injection Generic drug:  insulin lispro Replaced by:  insulin lispro 100 UNIT/ML KiwkPen   metFORMIN 1000 MG tablet Commonly known as:  GLUCOPHAGE   PARADIGM RESERVOIR 3ML Misc     TAKE these medications   ADVAIR DISKUS 100-50 MCG/DOSE Aepb Generic drug:  Fluticasone-Salmeterol INHALE 1 PUFF BY MOUTH  TWICE DAILY   albuterol 108 (90 Base) MCG/ACT inhaler Commonly known as:  PROAIR HFA Inhale 2 puffs into the  lungs every 4 (four) hours as needed for wheezing or shortness of breath.   fluticasone 50 MCG/ACT nasal spray Commonly known as:  FLONASE Place 1 spray into both nostrils daily.   insulin lispro 100 UNIT/ML KiwkPen Commonly known as:  HUMALOG KWIKPEN Inject 0-0.2 mLs (0-20 Units total) into the skin 4 (four) times daily -  with meals and at bedtime. See sliding scale Replaces:  HUMALOG 100 UNIT/ML injection   iron polysaccharides 150 MG capsule Commonly known as:  NU-IRON Take 1 capsule (150 mg total) by mouth daily.   levothyroxine 75 MCG tablet Commonly known as:  SYNTHROID, LEVOTHROID TAKE 1 TABLET BY MOUTH ONCE DAILY ON AN EMPTY STOMACH  WITH A GLASS OF WATER 30-60 MINUTES BEFORE BREAKFAST   loratadine 10 MG tablet Commonly known as:  CLARITIN Take 10 mg by mouth daily.   metoprolol succinate 50 MG 24 hr tablet Commonly known as:  TOPROL-XL TAKE 1 TABLET BY MOUTH  DAILY Notes to patient:  Monitor Blood Pressure Daily and keep a log for primary care physician.  You may need to make changes to your medications with rapid weight loss.     montelukast 10 MG tablet Commonly known as:  SINGULAIR TAKE 1 TABLET BY MOUTH  DAILY   multivitamin tablet Take 1 tablet by mouth daily.   omeprazole 20 MG capsule Commonly known as:  PRILOSEC Take 20 mg by mouth daily.   ONE TOUCH ULTRA TEST test strip Generic drug:  glucose blood   oxyCODONE 5 MG/5ML solution Commonly known as:  ROXICODONE Take 5-10 mLs (5-10 mg total) by mouth every 4 (four) hours as needed for moderate pain or severe pain.   vitamin B-12 1000 MCG tablet Commonly known as:  CYANOCOBALAMIN Take 1,000 mcg by mouth daily.   Vitamin D (Ergocalciferol) 50000 units Caps capsule Commonly known as:  DRISDOL Take 50,000 Units by mouth 2 (two) times a week.      Follow-up Information    Greer Pickerel, MD. Go on 08/31/2017.   Specialty:  General Surgery Why:  [address] information: [place] [address] [phone]        Greer Pickerel, MD Follow up.   Specialty:  General Surgery Contact information: [place] [address] [phone]            The results of significant diagnostics from this hospitalization (including imaging, microbiology, ancillary and laboratory) are listed below for reference.    Significant Diagnostic Studies: Dg Chest 2 View  Result Date: 08/03/2017 CLINICAL DATA:  Preop, cough EXAM: CHEST  2 VIEW COMPARISON:  06/29/2017 FINDINGS: Heart and mediastinal contours are within normal limits. No focal opacities or effusions. No acute bony abnormality. IMPRESSION: No active cardiopulmonary disease. Electronically Signed   By: Rolm Baptise M.D.   On: 08/03/2017 16:09    Labs: Basic Metabolic Panel: Recent Labs  Lab 08/03/17 1543 08/08/17 0437  NA 136 137  K 4.0 3.9  CL 104 105  CO2 25 25  GLUCOSE 198* 207*  BUN 9 7  CREATININE 0.53 0.54  CALCIUM 9.0 8.4*   Liver Function Tests: Recent Labs  Lab 08/03/17 1543 08/08/17 0437  AST 16 117*  ALT 19 100*  ALKPHOS 51 49  BILITOT 0.5 0.8  PROT 7.1 6.5  ALBUMIN 3.8 3.5    CBC: Recent Labs  Lab 08/03/17 1543 08/07/17 1037 08/08/17 0437  WBC 9.4  --  12.8*  NEUTROABS 5.1  --  9.6*  HGB 10.9* 11.0* 10.2*  HCT 34.8* 35.4* 33.2*  MCV 78.7  --  79.6  PLT 340  --  302    CBG: Recent Labs  Lab 08/07/17 2012 08/07/17 2335 08/08/17 0409 08/08/17 0757 08/08/17 1209  GLUCAP 258* 216* 203* 185* 187*    Principal Problem:   Morbid obesity ([place]) Active Problems:   High cholesterol   Type 2 diabetes mellitus (HCC)   GERD (gastroesophageal reflux disease)   PCOS (polycystic ovarian syndrome)   Essential hypertension   Bilateral chronic knee pain   Time coordinating discharge: 10 min  Signed:  Gayland Curry, MD [facility] [place] [phone] 08/08/2017, 3:12 PM

## 2017-08-10 ENCOUNTER — Telehealth (HOSPITAL_COMMUNITY): Payer: Self-pay

## 2017-08-10 NOTE — Telephone Encounter (Signed)
Follow up with bariatric surgical patient to discuss post discharge questions.   1.  Are you having any pain not relieved by pain medication?no pain medication  2.  How much fluid total fluid intake have you had in the last 24/48 hours?  29 ounces encouraged to increase fluids, patient feels good no s/s of dehydration  3.  How much protein intake have you had in the last 24/48 hours?30 grams  4.  Have you had any trouble making urine?no making a lot of urine no problems  5.  Have you had nausea that has not been relieved by nausea medication?no  6.  Are you ambulating every hour?yes  7.  Are you passing gas or had a BM?had bm  8.  Do you know how to contact [place]? [facility]? [facility]?yes  9.  Are you taking your vitamins and calcium without difficulty?no  problem  10. Tell me how your incision looks?  Any redness, open incision, or drainage?look great

## 2017-08-20 ENCOUNTER — Encounter: Payer: Self-pay | Admitting: Family Medicine

## 2017-08-20 ENCOUNTER — Ambulatory Visit: Payer: 59 | Admitting: Family Medicine

## 2017-08-20 DIAGNOSIS — D508 Other iron deficiency anemias: Secondary | ICD-10-CM

## 2017-08-20 DIAGNOSIS — K219 Gastro-esophageal reflux disease without esophagitis: Secondary | ICD-10-CM | POA: Diagnosis not present

## 2017-08-20 DIAGNOSIS — I10 Essential (primary) hypertension: Secondary | ICD-10-CM | POA: Diagnosis not present

## 2017-08-20 DIAGNOSIS — E034 Atrophy of thyroid (acquired): Secondary | ICD-10-CM

## 2017-08-20 DIAGNOSIS — J4520 Mild intermittent asthma, uncomplicated: Secondary | ICD-10-CM

## 2017-08-20 DIAGNOSIS — E119 Type 2 diabetes mellitus without complications: Secondary | ICD-10-CM

## 2017-08-20 DIAGNOSIS — Z794 Long term (current) use of insulin: Secondary | ICD-10-CM | POA: Diagnosis not present

## 2017-08-20 NOTE — Assessment & Plan Note (Signed)
Continue Advair and Singulair

## 2017-08-20 NOTE — Assessment & Plan Note (Signed)
She believes the surgeon will be checking her CBC; she'll continue her vitamins which contain iron

## 2017-08-20 NOTE — Assessment & Plan Note (Signed)
Not using insulin pump any longer; off of Victoza and metformin; now on SSI; will defer other diabetes changes to her endocrinologist

## 2017-08-20 NOTE — Progress Notes (Signed)
BP 132/72 (BP Location: Right Arm, Patient Position: Sitting, Cuff Size: Large)   Pulse 81   Temp (!) 97.5 F (36.4 C) (Oral)   Ht 5\' 7"  (1.702 m)   Wt 233 lb (105.7 kg)   LMP 07/22/2017 (Exact Date)   SpO2 97%   BMI 36.49 kg/m    Subjective:    Patient ID: Amber Stanley, female    DOB: 05-Jul-1971, 46 y.o.   MRN: [id number]  HPI: Amber Stanley is a 46 y.o. female  Chief Complaint  Patient presents with  . 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 for surgical f/u Highest weight was 275 pounds, a while ago Pre-surgery weight was 250 pounds; clothes are already fitting differently Asthma; still on Advair; no use of rescue since surgery Allergies; just uses when bothered Sees endocrinologist, not wearing insulin pump; has sliding scale insulin 100-150 3, 151-200 4, 201-250 7 units, 251 and up not an issue lately Still taking the prilosec; at some point she'd like to come off; no real triggers, altered anatomy and still on liquids  Depression screen [facility] 2/9 08/20/2017 06/08/2017 05/30/2017 05/10/2017 11/21/2016  Decreased Interest 0 0 0 0 0  Down, Depressed, Hopeless 0 0 0 0 0  PHQ - 2 Score 0 0 0 0 0   Relevant past medical, surgical, family and social history reviewed Past Medical History:  Diagnosis Date  . Anemia   . Asthma   . Diabetes mellitus without complication ([place])   . Diverticulosis 08/2016  . GERD (gastroesophageal reflux disease)   . Heart murmur   . Hypercholesterolemia 07/03/2011  . Hypertension   . Hypothyroidism   . Morbid obesity ([place]) 08/26/2015  . Pneumonia 06/2017  . Polycystic ovaries   . Reflux   . Thyroid disease    Past Surgical History:  Procedure Laterality Date  . BILATERAL CARPAL TUNNEL RELEASE  2007  . CESAREAN SECTION  10/09/2010   FTP after IOL then had a PPH at [facility]  . CHOLECYSTECTOMY  1999  .  COLONOSCOPY WITH PROPOFOL N/A 09/05/2016   Procedure: COLONOSCOPY WITH PROPOFOL;  Surgeon: Jonathon Bellows, MD;  Location: [facility] ENDOSCOPY;  Service: Endoscopy;  Laterality: N/A;. Diverticulosis  . DILATION AND CURETTAGE OF UTERUS  08/18/2003   endometrial polyps and hyperplasia  . ESOPHAGOGASTRODUODENOSCOPY (EGD) WITH PROPOFOL N/A 09/05/2016   Procedure: ESOPHAGOGASTRODUODENOSCOPY (EGD) WITH PROPOFOL;  Surgeon: Jonathon Bellows, MD;  Location: [facility] ENDOSCOPY;  Service: Endoscopy;  Laterality: N/A;  . 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 SESMOIDECTOMY Left    fractured sesamoid bone removed from foot   Family History  Problem Relation Age of Onset  . Diabetes Mother   . Hypertension Mother   . Heart attack Mother 70       died from MI  . Heart disease Sister 44  . Cancer Brother 58       leukemia  . Stroke Maternal Grandmother   . Hypertension Maternal Grandmother   . Diabetes Paternal Grandmother   . Hypertension Paternal Grandmother   . Stroke Paternal Grandmother        mini strokes  . Ulcers Maternal Grandfather   . Lymphoma Cousin   .  Hypertension Maternal Uncle   . Kidney failure Maternal Uncle   . Thyroid disease Paternal Aunt   . Multiple sclerosis Paternal Aunt   . COPD Neg Hx   . Breast cancer Neg Hx    Social History   Tobacco Use  . Smoking status: Former Smoker    Packs/day: 1.50    Years: 20.00    Pack years: 30.00    Types: Cigarettes    Last attempt to quit: 09/10/2005    Years since quitting: 11.9  . Smokeless tobacco: Never Used  . Tobacco comment: quit 7 years ago   Substance Use Topics  . Alcohol use: Yes    Comment: rarely  . Drug use: No    Interim medical history since last visit reviewed. Allergies and medications reviewed  Review of Systems Per HPI unless specifically indicated above       Objective:    BP 132/72 (BP Location: Right Arm, Patient Position: Sitting, Cuff Size: Large)   Pulse 81   Temp (!) 97.5 F (36.4 C) (Oral)   Ht 5\' 7"  (1.702 m)   Wt 233 lb (105.7 kg)   LMP 07/22/2017 (Exact Date)   SpO2 97%   BMI 36.49 kg/m   Wt Readings from Last 3 Encounters:  08/20/17 233 lb (105.7 kg)  08/08/17 253 lb 8.5 oz (115 kg)  08/03/17 260 lb 4 oz (118 kg)    Physical Exam  Constitutional: She appears well-developed and well-nourished. No distress.  Weight down 17 pounds since surgery  HENT:  Mouth/Throat: Mucous membranes are normal.  Eyes: EOM are normal. No scleral icterus.  Cardiovascular: Normal rate and regular rhythm.  Pulmonary/Chest: Effort normal and breath sounds normal. She has no wheezes.  Abdominal: Soft. Normal appearance and bowel sounds are normal. There is no tenderness.  Surgical sites (port sites) C/D/I, slight thickening of the one furthest left, no drainage though  Skin: She is not diaphoretic. No pallor.  Psychiatric: She has a normal mood and affect. Her behavior is normal.   Diabetic Foot Form - Detailed   Diabetic Foot Exam - detailed Diabetic Foot exam was performed with the following findings:  Yes 08/20/2017 12:10 PM  Visual Foot Exam completed.:  Yes  Pulse Foot Exam completed.:  Yes  Right Dorsalis Pedis:  Present Left Dorsalis Pedis:  Present  Sensory Foot Exam Completed.:  Yes Semmes-Weinstein Monofilament Test R Site 1-Great Toe:  Pos L Site 1-Great Toe:  Pos        Results for orders placed or performed during the hospital encounter of 08/07/17  Pregnancy, urine STAT morning of surgery  Result Value Ref Range   Preg Test, Ur NEGATIVE NEGATIVE  Glucose, capillary  Result Value Ref Range   Glucose-Capillary 165 (H) 65 - 99 mg/dL   Comment 1 Notify RN   Glucose, capillary  Result Value Ref Range   Glucose-Capillary 149 (H) 65 - 99 mg/dL  Hemoglobin and hematocrit, blood  Result Value Ref Range   Hemoglobin 11.0 (L)  12.0 - 15.0 g/dL   HCT 35.4 (L) 36.0 - 46.0 %  Glucose, capillary  Result Value Ref Range   Glucose-Capillary 229 (H) 65 - 99 mg/dL  Glucose, capillary  Result Value Ref Range   Glucose-Capillary 274 (H) 65 - 99 mg/dL  CBC WITH DIFFERENTIAL  Result Value Ref Range   WBC 12.8 (H) 4.0 - 10.5 K/uL   RBC 4.17 3.87 - 5.11 MIL/uL   Hemoglobin 10.2 (L) 12.0 - 15.0 g/dL  HCT 33.2 (L) 36.0 - 46.0 %   MCV 79.6 78.0 - 100.0 fL   MCH 24.5 (L) 26.0 - 34.0 pg   MCHC 30.7 30.0 - 36.0 g/dL   RDW 15.4 11.5 - 15.5 %   Platelets 302 150 - 400 K/uL   Neutrophils Relative % 75 %   Neutro Abs 9.6 (H) 1.7 - 7.7 K/uL   Lymphocytes Relative 16 %   Lymphs Abs 2.0 0.7 - 4.0 K/uL   Monocytes Relative 9 %   Monocytes Absolute 1.2 (H) 0.1 - 1.0 K/uL   Eosinophils Relative 0 %   Eosinophils Absolute 0.0 0.0 - 0.7 K/uL   Basophils Relative 0 %   Basophils Absolute 0.0 0.0 - 0.1 K/uL  Comprehensive metabolic panel  Result Value Ref Range   Sodium 137 135 - 145 mmol/L   Potassium 3.9 3.5 - 5.1 mmol/L   Chloride 105 101 - 111 mmol/L   CO2 25 22 - 32 mmol/L   Glucose, Bld 207 (H) 65 - 99 mg/dL   BUN 7 6 - 20 mg/dL   Creatinine, Ser 0.54 0.44 - 1.00 mg/dL   Calcium 8.4 (L) 8.9 - 10.3 mg/dL   Total Protein 6.5 6.5 - 8.1 g/dL   Albumin 3.5 3.5 - 5.0 g/dL   AST 117 (H) 15 - 41 U/L   ALT 100 (H) 14 - 54 U/L   Alkaline Phosphatase 49 38 - 126 U/L   Total Bilirubin 0.8 0.3 - 1.2 mg/dL   GFR calc non Af Amer >60 >60 mL/min   GFR calc Af Amer >60 >60 mL/min   Anion gap 7 5 - 15  Glucose, capillary  Result Value Ref Range   Glucose-Capillary 280 (H) 65 - 99 mg/dL  Glucose, capillary  Result Value Ref Range   Glucose-Capillary 216 (H) 65 - 99 mg/dL  Glucose, capillary  Result Value Ref Range   Glucose-Capillary 203 (H) 65 - 99 mg/dL  Glucose, capillary  Result Value Ref Range   Glucose-Capillary 185 (H) 65 - 99 mg/dL  Glucose, capillary  Result Value Ref Range   Glucose-Capillary 258 (H) 65 - 99  mg/dL  Glucose, capillary  Result Value Ref Range   Glucose-Capillary 187 (H) 65 - 99 mg/dL      Assessment & Plan:   Problem List Items Addressed This Visit      Cardiovascular and Mediastinum   Essential hypertension    Continue medicine for now; she'll see surgeon in another 12 days; can hopefully decrease or stop medicine as her weight continues to drop        Respiratory   Asthma    Continue Advair and Singulair        Digestive   GERD (gastroesophageal reflux disease) (Chronic)    She'll continue the PPI for now      Relevant Medications   ondansetron (ZOFRAN) 4 MG tablet     Endocrine   Type 2 diabetes mellitus (HCC) (Chronic)    Not using insulin pump any longer; off of Victoza and metformin; now on SSI; will defer other diabetes changes to her endocrinologist      Relevant Medications   insulin lispro (HUMALOG KWIKPEN) 100 UNIT/ML KiwkPen   Hypothyroidism (Chronic)    Check TSH around mid-February      Relevant Orders   TSH     Other   Morbid obesity (HCC) (Chronic)    BMI > 35 plus diabetes; 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  An after-visit summary was printed and given to the patient at [place].  Please see the patient instructions which may contain other information and recommendations beyond what is mentioned above in the assessment and plan.  No orders of the defined types were placed in this encounter.   Orders Placed This Encounter  Procedures  . TSH

## 2017-08-20 NOTE — Assessment & Plan Note (Signed)
Check TSH around mid-February

## 2017-08-20 NOTE — Assessment & Plan Note (Signed)
Continue medicine for now; she'll see surgeon in another 12 days; can hopefully decrease or stop medicine as her weight continues to drop

## 2017-08-20 NOTE — Assessment & Plan Note (Signed)
She'll continue the PPI for now

## 2017-08-20 NOTE — Assessment & Plan Note (Signed)
BMI > 35 plus diabetes; now status post Roux-en-Y gastric bypass with significant weight loss already; keep f/u with bariatric surgeon

## 2017-08-22 ENCOUNTER — Encounter: Payer: 59 | Attending: General Surgery | Admitting: Skilled Nursing Facility1

## 2017-08-22 ENCOUNTER — Encounter: Payer: Self-pay | Admitting: Skilled Nursing Facility1

## 2017-08-22 DIAGNOSIS — Z794 Long term (current) use of insulin: Secondary | ICD-10-CM | POA: Diagnosis not present

## 2017-08-22 DIAGNOSIS — K5790 Diverticulosis of intestine, part unspecified, without perforation or abscess without bleeding: Secondary | ICD-10-CM | POA: Diagnosis not present

## 2017-08-22 DIAGNOSIS — Z833 Family history of diabetes mellitus: Secondary | ICD-10-CM | POA: Diagnosis not present

## 2017-08-22 DIAGNOSIS — Z8379 Family history of other diseases of the digestive system: Secondary | ICD-10-CM | POA: Insufficient documentation

## 2017-08-22 DIAGNOSIS — K219 Gastro-esophageal reflux disease without esophagitis: Secondary | ICD-10-CM | POA: Diagnosis not present

## 2017-08-22 DIAGNOSIS — Z87891 Personal history of nicotine dependence: Secondary | ICD-10-CM | POA: Insufficient documentation

## 2017-08-22 DIAGNOSIS — Z6839 Body mass index (BMI) 39.0-39.9, adult: Secondary | ICD-10-CM | POA: Insufficient documentation

## 2017-08-22 DIAGNOSIS — E7800 Pure hypercholesterolemia, unspecified: Secondary | ICD-10-CM | POA: Insufficient documentation

## 2017-08-22 DIAGNOSIS — Z713 Dietary counseling and surveillance: Secondary | ICD-10-CM | POA: Diagnosis not present

## 2017-08-22 DIAGNOSIS — E119 Type 2 diabetes mellitus without complications: Secondary | ICD-10-CM

## 2017-08-22 DIAGNOSIS — Z8249 Family history of ischemic heart disease and other diseases of the circulatory system: Secondary | ICD-10-CM | POA: Diagnosis not present

## 2017-08-22 DIAGNOSIS — Z79899 Other long term (current) drug therapy: Secondary | ICD-10-CM | POA: Insufficient documentation

## 2017-08-22 NOTE — Progress Notes (Signed)
Bariatric Class:  Appt start time: 1530 end time:  1630.  2 Week Post-Operative Nutrition Class  Patient was seen on 08/22/2016 for [facility]ative [facility]   Pt states she checks her blood sugar 4 times a day with 200 and less A1C 7.2.   Surgery date: 08/07/2017 Surgery type: RYGB Start weight at [facility]: 256.3 Weight today: pt declined   TANITA  BODY COMP RESULTS     BMI (kg/m^2)    Fat Mass (lbs)    Fat Free Mass (lbs)    Total Body Water (lbs)    The following the learning objectives were met by the patient during this course:  Identifies Phase 3A (Soft, High Proteins) Dietary Goals and will begin from 2 weeks post-operatively to 2 months post-operatively  Identifies appropriate sources of fluids and proteins   States protein recommendations and appropriate sources post-operatively  Identifies the need for appropriate texture modifications, mastication, and bite sizes when consuming solids  Identifies appropriate multivitamin and calcium sources post-operatively  Describes the need for physical activity post-operatively and will follow MD recommendations  States when to call healthcare provider regarding medication questions or post-operative complications  Handouts given during class include:  Phase 3A: Soft, High Protein Diet Handout  Follow-Up Plan: Patient will follow-up at [facility] in 6 weeks for 2 month post-op nutrition visit for diet advancement per MD.

## 2017-08-23 ENCOUNTER — Encounter: Payer: Self-pay | Admitting: Obstetrics and Gynecology

## 2017-08-23 ENCOUNTER — Ambulatory Visit (INDEPENDENT_AMBULATORY_CARE_PROVIDER_SITE_OTHER): Payer: 59 | Admitting: Obstetrics and Gynecology

## 2017-08-23 VITALS — BP 110/60 | HR 68 | Ht 68.0 in | Wt 230.0 lb

## 2017-08-23 DIAGNOSIS — Z30011 Encounter for initial prescription of contraceptive pills: Secondary | ICD-10-CM

## 2017-08-23 DIAGNOSIS — E119 Type 2 diabetes mellitus without complications: Secondary | ICD-10-CM | POA: Diagnosis not present

## 2017-08-23 DIAGNOSIS — E039 Hypothyroidism, unspecified: Secondary | ICD-10-CM | POA: Diagnosis not present

## 2017-08-23 MED ORDER — LEVONORGESTREL-ETHINYL ESTRAD 0.1-20 MG-MCG PO TABS: 1.0000 | ORAL_TABLET | Freq: Every day | ORAL | 9 refills | Status: DC

## 2017-08-23 NOTE — Progress Notes (Signed)
Chief Complaint  Patient presents with  . Contraception    HPI:      [name] is a 47 y.o. G1P1001 who LMP was Patient's last menstrual period was 08/20/2017., presents today for [facility] start. She is s/p bariatric surg 12/18 and can't get pregnant for 18 months. She did OCPs and NFP in the past. Menses are monthly, lasting 4 days, no BTB, mild dsymen. Has worse pain with ovulation. Pregnancy after 18 months is ok for pt.  Pt doing well after surg. No hx of DVTs/HTN/clotting disorders/tobacco use. She is considering IUD vs OCPs. No side effects with OCPs in the past.   Last annual 05/07/17. Last mammo 03/16/17   Past Medical History:  Diagnosis Date  . Anemia   . Asthma   . Diabetes mellitus without complication ([place])   . Diverticulosis 08/2016  . GERD (gastroesophageal reflux disease)   . Heart murmur   . Hypercholesterolemia 07/03/2011  . Hypertension   . Hypothyroidism   . Morbid obesity ([place]) 08/26/2015  . Pneumonia 06/2017  . Polycystic ovaries   . Reflux   . Thyroid disease     Past Surgical History:  Procedure Laterality Date  . BILATERAL CARPAL TUNNEL RELEASE  2007  . CESAREAN SECTION  10/09/2010   FTP after IOL then had a PPH [facility]  . CHOLECYSTECTOMY  1999  . COLONOSCOPY WITH PROPOFOL N/A 09/05/2016   Procedure: COLONOSCOPY WITH PROPOFOL;  Surgeon: Jonathon Bellows, MD;  Location: [facility] ENDOSCOPY;  Service: Endoscopy;  Laterality: N/A;. Diverticulosis  . DILATION AND CURETTAGE OF UTERUS  08/18/2003   endometrial polyps and hyperplasia  . ESOPHAGOGASTRODUODENOSCOPY (EGD) WITH PROPOFOL N/A 09/05/2016   Procedure: ESOPHAGOGASTRODUODENOSCOPY (EGD) WITH PROPOFOL;  Surgeon: Jonathon Bellows, MD;  Location: [facility] ENDOSCOPY;  Service: Endoscopy;  Laterality: N/A;  . 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 SESMOIDECTOMY Left      fractured sesamoid bone removed from foot    Family History  Problem Relation Age of Onset  . Diabetes Mother   . Hypertension Mother   . Heart attack Mother 87       died from MI  . Heart disease Sister 96  . Cancer Brother 35       leukemia  . Stroke Maternal Grandmother   . Hypertension Maternal Grandmother   . Diabetes Paternal Grandmother   . Hypertension Paternal Grandmother   . Stroke Paternal Grandmother        mini strokes  . Ulcers Maternal Grandfather   . Lymphoma Cousin   . Hypertension Maternal Uncle   . Kidney failure Maternal Uncle   . Thyroid disease Paternal Aunt   . Multiple sclerosis Paternal Aunt   . COPD Neg Hx   . Breast cancer Neg Hx     Social History   Socioeconomic History  . Marital status: Married    Spouse name: Amber Stanley  . Number of children: 1  . Years of education: Not on file  . Highest education level: Not on file  Social Needs  . Financial resource strain: Not on file  . Food insecurity - worry: Not on file  . Food insecurity - inability: Not on file  . Transportation needs - medical: Not on file  . Transportation needs - non-medical: Not on file  Occupational History  . Not on file  Tobacco Use  .  Smoking status: Former Smoker    Packs/day: 1.50    Years: 20.00    Pack years: 30.00    Types: Cigarettes    Last attempt to quit: 09/10/2005    Years since quitting: 11.9  . Smokeless tobacco: Never Used  . Tobacco comment: quit 7 years ago   Substance and Sexual Activity  . Alcohol use: Yes    Comment: rarely  . Drug use: No  . Sexual activity: Yes    Partners: Male    Birth control/protection: Rhythm  Other Topics Concern  . Not on file  Social History Narrative  . Not on file     Current Outpatient Medications:  .  ADVAIR DISKUS 100-50 MCG/DOSE AEPB, INHALE 1 PUFF BY MOUTH  TWICE DAILY, Disp: 180 each, Rfl: 3 .  albuterol (PROAIR HFA) 108 (90 Base) MCG/ACT inhaler, Inhale 2 puffs into the lungs every 4 (four) hours  as needed for wheezing or shortness of breath., Disp: 1 Inhaler, Rfl: 0 .  fluticasone (FLONASE) 50 MCG/ACT nasal spray, Place 1 spray into both nostrils as needed. , Disp: , Rfl:  .  insulin lispro (HUMALOG KWIKPEN) 100 UNIT/ML KiwkPen, See sliding scale with meals and at bedtime; subcu, Disp: 15 mL, Rfl: 5 .  levonorgestrel-ethinyl estradiol (AVIANE) 0.1-20 MG-MCG tablet, Take 1 tablet by mouth daily., Disp: 28 tablet, Rfl: 9 .  levothyroxine (SYNTHROID, LEVOTHROID) 75 MCG tablet, TAKE 1 TABLET BY MOUTH ONCE DAILY ON AN EMPTY STOMACH  WITH A GLASS OF WATER 30-60 MINUTES BEFORE BREAKFAST, Disp: , Rfl:  .  loratadine (CLARITIN) 10 MG tablet, Take 1 tablet (10 mg total) by mouth daily as needed for allergies., Disp: , Rfl:  .  metoprolol succinate (TOPROL-XL) 50 MG 24 hr tablet, TAKE 1 TABLET BY MOUTH  DAILY, Disp: 90 tablet, Rfl: 3 .  montelukast (SINGULAIR) 10 MG tablet, TAKE 1 TABLET BY MOUTH  DAILY, Disp: 90 tablet, Rfl: 3 .  Multiple Vitamin (MULTIVITAMIN) tablet, Take 1 tablet by mouth 2 (two) times daily. (bariatric advantage), Disp: , Rfl:  .  omeprazole (PRILOSEC) 20 MG capsule, Take 20 mg by mouth daily., Disp: , Rfl:  .  ondansetron (ZOFRAN) 4 MG tablet, Take 4 mg by mouth every 8 (eight) hours as needed. , Disp: , Rfl:  .  ONE TOUCH ULTRA TEST test strip, , Disp: , Rfl:  .  oxyCODONE (ROXICODONE) 5 MG/5ML solution, Take 5-10 mLs (5-10 mg total) by mouth every 4 (four) hours as needed for moderate pain or severe pain. (Patient not taking: Reported on 08/20/2017), Disp: , Rfl: 0   ROS:  Review of Systems  Constitutional: Negative for fever.  Gastrointestinal: Negative for blood in stool, constipation, diarrhea, nausea and vomiting.  Genitourinary: Negative for dyspareunia, dysuria, flank pain, frequency, hematuria, urgency, vaginal bleeding, vaginal discharge and vaginal pain.  Musculoskeletal: Negative for back pain.  Skin: Negative for rash.     OBJECTIVE:   Vitals:  BP 110/60    Pulse 68   Ht 5\' 8"  (1.727 m)   Wt 230 lb (104.3 kg)   LMP 08/20/2017   BMI 34.97 kg/m   Physical Exam  Constitutional: She is oriented to person, place, and time and well-developed, well-nourished, and in no distress.  Neurological: She is alert and oriented to person, place, and time.  Psychiatric: Memory, affect and judgment normal.  Vitals reviewed.   Assessment/Plan: Encounter for initial prescription of contraceptive pills - OCP start Sun. Rx aviane. Condoms for 1 mo. F/u prn. -  Plan: levonorgestrel-ethinyl estradiol (AVIANE) 0.1-20 MG-MCG tablet   Meds ordered this encounter  Medications  . levonorgestrel-ethinyl estradiol (AVIANE) 0.1-20 MG-MCG tablet    Sig: Take 1 tablet by mouth daily.    Dispense:  28 tablet    Refill:  9     Return if symptoms worsen or fail to improve.  Harla Mensch B. Neddie Steedman, PA-C 08/23/2017 8:53 AM

## 2017-08-23 NOTE — Patient Instructions (Signed)
I value your feedback and entrusting us with your care. If you get a [place] patient survey, I would appreciate you taking the time to let us know about your experience today. Thank you! 

## 2017-08-28 DIAGNOSIS — E119 Type 2 diabetes mellitus without complications: Secondary | ICD-10-CM | POA: Diagnosis not present

## 2017-08-28 DIAGNOSIS — E039 Hypothyroidism, unspecified: Secondary | ICD-10-CM | POA: Diagnosis not present

## 2017-10-02 ENCOUNTER — Ambulatory Visit: Payer: 59 | Admitting: Registered"

## 2017-10-02 DIAGNOSIS — I10 Essential (primary) hypertension: Secondary | ICD-10-CM | POA: Diagnosis not present

## 2017-10-04 ENCOUNTER — Encounter: Payer: Self-pay | Admitting: Registered"

## 2017-10-04 ENCOUNTER — Encounter: Payer: 59 | Attending: General Surgery | Admitting: Registered"

## 2017-10-04 DIAGNOSIS — Z8379 Family history of other diseases of the digestive system: Secondary | ICD-10-CM | POA: Insufficient documentation

## 2017-10-04 DIAGNOSIS — Z794 Long term (current) use of insulin: Secondary | ICD-10-CM | POA: Insufficient documentation

## 2017-10-04 DIAGNOSIS — E119 Type 2 diabetes mellitus without complications: Secondary | ICD-10-CM

## 2017-10-04 DIAGNOSIS — Z6839 Body mass index (BMI) 39.0-39.9, adult: Secondary | ICD-10-CM | POA: Diagnosis not present

## 2017-10-04 DIAGNOSIS — Z713 Dietary counseling and surveillance: Secondary | ICD-10-CM | POA: Insufficient documentation

## 2017-10-04 DIAGNOSIS — Z79899 Other long term (current) drug therapy: Secondary | ICD-10-CM | POA: Insufficient documentation

## 2017-10-04 DIAGNOSIS — Z8249 Family history of ischemic heart disease and other diseases of the circulatory system: Secondary | ICD-10-CM | POA: Insufficient documentation

## 2017-10-04 DIAGNOSIS — E7800 Pure hypercholesterolemia, unspecified: Secondary | ICD-10-CM | POA: Insufficient documentation

## 2017-10-04 DIAGNOSIS — Z87891 Personal history of nicotine dependence: Secondary | ICD-10-CM | POA: Diagnosis not present

## 2017-10-04 DIAGNOSIS — K219 Gastro-esophageal reflux disease without esophagitis: Secondary | ICD-10-CM | POA: Diagnosis not present

## 2017-10-04 DIAGNOSIS — K5790 Diverticulosis of intestine, part unspecified, without perforation or abscess without bleeding: Secondary | ICD-10-CM | POA: Diagnosis not present

## 2017-10-04 DIAGNOSIS — Z833 Family history of diabetes mellitus: Secondary | ICD-10-CM | POA: Insufficient documentation

## 2017-10-04 NOTE — Patient Instructions (Addendum)
-   Increase protein intake to at least 60 grams a day.   - Track protein and fluid intake.   -  Eat sugar-free popsicles and/or sugar-free jello to increase fluid intake.   - Try [employer] multivitamin. Order bariatric multivitamins.   - Aim to chew at least 30 times per bite when eating. Listen to your body for when it is satisfied.

## 2017-10-04 NOTE — Progress Notes (Signed)
Follow-up visit: 8 Weeks Post-Operative RYGB Surgery  Medical Nutrition Therapy:  Appt start time: 8:40 end time:  9:40.  Primary concerns today: Post-operative Bariatric Surgery Nutrition Management.  Non scale victories: no longer taking insulin  Surgery date: 08/07/2017 Surgery type: RYGB Start weight at [facility]: 256.3 Weight today: 211.8 Weight change: N/A Total weight lost: 44.5 lbs Weight loss goal: eliminate insulin   TANITA  BODY COMP RESULTS  10/04/2017   BMI (kg/m^2) 32.2   Fat Mass (lbs) 85.8   Fat Free Mass (lbs) 126.0   Total Body Water (lbs) 90.8   Pt states she no longer has a burning desire for coffee. Pt states she has tried making a switch to hot tea and its not doing it for her. Pt states she does not like a lot of meat or eggs, prefers beans and cheese. Pt states she has run out of Bariatric Advantage multivitamins and plans to switch to [employer]'s vitamins. Pt was encouraged to try [place] multivitamin and was given a sample: Lot # [id number] Exp 02/2019 Pt states she has history of anemia. Pt states she has not been tracking her protein since returning to work 4 weeks ago. Pt states she sips while eating at time. Pt states she ate too much chicken once as well as ate too fast, caused her to vomit. Pt states she sometimes thinks she doesn't want that to happen again so she limits how much she is eating at once. Pt is not meeting protein or fluid goals.   Preferred Learning Style:   No preference indicated   Learning Readiness:   Ready  Change in progress  24-hr recall: B (AM): 1/2 quiche (6.5g) Snk (AM): 1/2 quiche (6.5g) L (PM): 1/2 c beans (7g) Snk (PM): string cheese (6g) or greek yogurt (15g) D (PM): 1/2 small [employer] chili (8.5g) Snk (PM): cheese chips (7-14g)  Fluid intake: water; ~32 ounces Estimated total protein intake: ~50 grams  Medications: See list; no longer taking insulin Supplementation: Bariatric Advantage + 3 TUMS  CBG  monitoring: yes, 2x/week Average CBG per patient: <120 Last patient reported A1c: 7.5 in Dec/Jan  Using straws: no Drinking while eating: yes depends  Having you been chewing well: most times Chewing/swallowing difficulties: once with chicken and pork Changes in vision: no Changes to mood/headaches: no Hair loss/Changes to skin/Changes to nails: no, no, no Any difficulty focusing or concentrating: no Sweating: no Dizziness/Lightheaded: yes, states it may be due to blood pressure Palpitations: no  Carbonated beverages: no N/V/D/C/GAS: yes, yes, yes, yes, yes Abdominal Pain: yes, pain on right ride sometimes right after eating Dumping syndrome: yes, [employer]-chicken nuggets Last Lap-Band fill: N/A  Recent physical activity:  Elliptical 11 min, 2-5x/week  Progress Towards Goal(s):  In progress.  Handouts given during visit include:  Vegetarian Proteins   Nutritional Diagnosis:  Inadequate fluid intake As related to bariatric surgery post-op recommendations.  As evidenced by pt report of less than 64 ounces of fluid. NI-5.7.1 Inadequate protein intake As related to bariatric surgery post-op recommendations.  As evidenced by pt report of less than 60 grams of protein.    Intervention:  Nutrition education and counseling. Pt was educated and counseled on the importance of meeting protein and fluid goals daily and how to meet those goals. Pt was counseled on chewing food well to help decrease intolerance of foods. Pt was educated on choosing tender, moist protein options and ways to prepare them.  Goals: - Increase protein intake to at least  60 grams a day.  - Track protein and fluid intake.  -  Eat sugar-free popsicles and/or sugar-free jello to increase fluid intake.  - Try [employer] multivitamin. Order bariatric multivitamins.  - Aim to chew at least 30 times per bite when eating. Listen to your body for when it is satisfied.   Teaching Method Utilized:   Visual Auditory Hands on  Barriers to learning/adherence to lifestyle change: work-life balance  Demonstrated degree of understanding via:  Teach Back   Monitoring/Evaluation:  Dietary intake, exercise, lap band fills, and body weight. Follow up in 2 weeks for 10 week post-op visit.

## 2017-10-19 ENCOUNTER — Encounter: Payer: 59 | Attending: General Surgery | Admitting: Registered"

## 2017-10-19 ENCOUNTER — Encounter: Payer: Self-pay | Admitting: Registered"

## 2017-10-19 DIAGNOSIS — Z833 Family history of diabetes mellitus: Secondary | ICD-10-CM | POA: Insufficient documentation

## 2017-10-19 DIAGNOSIS — Z79899 Other long term (current) drug therapy: Secondary | ICD-10-CM | POA: Diagnosis not present

## 2017-10-19 DIAGNOSIS — Z794 Long term (current) use of insulin: Secondary | ICD-10-CM | POA: Diagnosis not present

## 2017-10-19 DIAGNOSIS — Z6839 Body mass index (BMI) 39.0-39.9, adult: Secondary | ICD-10-CM | POA: Insufficient documentation

## 2017-10-19 DIAGNOSIS — K5790 Diverticulosis of intestine, part unspecified, without perforation or abscess without bleeding: Secondary | ICD-10-CM | POA: Insufficient documentation

## 2017-10-19 DIAGNOSIS — K219 Gastro-esophageal reflux disease without esophagitis: Secondary | ICD-10-CM | POA: Diagnosis not present

## 2017-10-19 DIAGNOSIS — Z87891 Personal history of nicotine dependence: Secondary | ICD-10-CM | POA: Diagnosis not present

## 2017-10-19 DIAGNOSIS — Z8379 Family history of other diseases of the digestive system: Secondary | ICD-10-CM | POA: Diagnosis not present

## 2017-10-19 DIAGNOSIS — E119 Type 2 diabetes mellitus without complications: Secondary | ICD-10-CM | POA: Insufficient documentation

## 2017-10-19 DIAGNOSIS — Z8249 Family history of ischemic heart disease and other diseases of the circulatory system: Secondary | ICD-10-CM | POA: Diagnosis not present

## 2017-10-19 DIAGNOSIS — E7800 Pure hypercholesterolemia, unspecified: Secondary | ICD-10-CM | POA: Insufficient documentation

## 2017-10-19 DIAGNOSIS — Z713 Dietary counseling and surveillance: Secondary | ICD-10-CM | POA: Diagnosis not present

## 2017-10-19 NOTE — Patient Instructions (Signed)
-   Aim to increase physical activity with elliptical from 2 days/week to 3 days/week.   [employer] Dev[employer] job with meeting your daily goals and making phenomenal progress!

## 2017-10-19 NOTE — Progress Notes (Signed)
Follow-up visit: 10 Weeks Post-Operative RYGB Surgery  Medical Nutrition Therapy:  Appt start time: 8:40 end time: 9:16.  Primary concerns today: Post-operative Bariatric Surgery Nutrition Management.  Non scale victories: no longer taking insulin  Surgery date: 08/07/2017 Surgery type: RYGB Start weight at [facility]: 256.3 Weight today: 204.2 lbs Weight change: 7.6 lbs loss from 211.8 (10/04/2017) Total weight lost: 52.1 lbs Weight loss goal: eliminate insulin   TANITA  BODY COMP RESULTS  10/04/2017 10/19/2017   BMI (kg/m^2) 32.2 31.0   Fat Mass (lbs) 85.8 81.2   Fat Free Mass (lbs) 126.0 123.0   Total Body Water (lbs) 90.8 88.4   Pt states she is getting at least 60 ounces of fluid most days. Pt is doing well with tracking protein and fluids using baritastic app. Pt states she is getting at least 60 grams daily. Pt states she can tolerate [employer] health chewable multivitamin. Pt is doing well!  Preferred Learning Style:   No preference indicated   Learning Readiness:   Ready  Change in progress  24-hr recall: B (AM): boiled egg (6g), [place] links (7g) Snk (AM): greek yogurt (15g) L (PM): [place] breast (14g), cheese (6g) Snk (PM): string cheese (6g) or greek yogurt (15g) D (PM): butter beans (9g) Snk (PM): none  Fluid intake: decaf tea, decaf coffee, water with flavor packs; 60+ ounces Estimated total protein intake: 60+ grams  Medications: See list; no longer taking insulin Supplementation: Bariatric Advantage + 3 TUMS  CBG monitoring: yes, 2x/day Average CBG per patient: <120 Last patient reported A1c: 7.5 in Dec/Jan  Using straws: no Drinking while eating: no  Having you been chewing well: yes Chewing/swallowing difficulties: no Changes in vision: no Changes to mood/headaches: no Hair loss/Changes to skin/Changes to nails: no, no, no Any difficulty focusing or concentrating: no Sweating: no Dizziness/Lightheaded: no Palpitations: no  Carbonated  beverages: no N/V/D/C/GAS: sometimes, no, no, no, no Abdominal Pain: no Dumping syndrome: no Last Lap-Band fill: N/A  Recent physical activity:  Elliptical 11 min, 2-5x/week  Progress Towards Goal(s):  In progress.  Handouts given during visit include:  Phase IV: High Protein + NS vegetables   Nutritional Diagnosis:  [place]-3.3 Overweight/obesity related to past poor dietary habits and physical inactivity as evidenced by patient w/ recent  RYGB surgery following dietary guidelines for continued weight loss.    Intervention:  Nutrition education and counseling.  Goals: - Aim to increase physical activity with elliptical from 2 days/week to 3 days/week.  [employer] job with meeting your daily goals and making phenomenal progress!  Teaching Method Utilized:  Visual Auditory Hands on  Barriers to learning/adherence to lifestyle change: work-life balance  Demonstrated degree of understanding via:  Teach Back   Monitoring/Evaluation:  Dietary intake, exercise, lap band fills, and body weight. Follow up in 3.5 months for 6 month post-op visit.

## 2017-10-27 ENCOUNTER — Other Ambulatory Visit: Payer: Self-pay | Admitting: Family Medicine

## 2017-10-31 DIAGNOSIS — K912 Postsurgical malabsorption, not elsewhere classified: Secondary | ICD-10-CM | POA: Diagnosis not present

## 2017-11-18 ENCOUNTER — Other Ambulatory Visit: Payer: Self-pay | Admitting: Family Medicine

## 2017-11-28 DIAGNOSIS — E039 Hypothyroidism, unspecified: Secondary | ICD-10-CM | POA: Diagnosis not present

## 2017-11-28 DIAGNOSIS — E119 Type 2 diabetes mellitus without complications: Secondary | ICD-10-CM | POA: Diagnosis not present

## 2017-11-28 LAB — HEMOGLOBIN A1C: HEMOGLOBIN A1C: 5.7

## 2017-12-01 DIAGNOSIS — H40053 Ocular hypertension, bilateral: Secondary | ICD-10-CM | POA: Diagnosis not present

## 2017-12-01 DIAGNOSIS — H5202 Hypermetropia, left eye: Secondary | ICD-10-CM | POA: Diagnosis not present

## 2017-12-01 LAB — HM DIABETES EYE EXAM

## 2017-12-05 DIAGNOSIS — E039 Hypothyroidism, unspecified: Secondary | ICD-10-CM | POA: Diagnosis not present

## 2017-12-05 DIAGNOSIS — E119 Type 2 diabetes mellitus without complications: Secondary | ICD-10-CM | POA: Diagnosis not present

## 2017-12-28 DIAGNOSIS — H93291 Other abnormal auditory perceptions, right ear: Secondary | ICD-10-CM | POA: Diagnosis not present

## 2017-12-28 DIAGNOSIS — H6980 Other specified disorders of Eustachian tube, unspecified ear: Secondary | ICD-10-CM | POA: Diagnosis not present

## 2017-12-28 DIAGNOSIS — H9190 Unspecified hearing loss, unspecified ear: Secondary | ICD-10-CM | POA: Diagnosis not present

## 2017-12-28 DIAGNOSIS — H6900 Patulous Eustachian tube, unspecified ear: Secondary | ICD-10-CM | POA: Diagnosis not present

## 2018-01-23 DIAGNOSIS — I10 Essential (primary) hypertension: Secondary | ICD-10-CM | POA: Diagnosis not present

## 2018-01-23 DIAGNOSIS — M25561 Pain in right knee: Secondary | ICD-10-CM | POA: Diagnosis not present

## 2018-01-25 ENCOUNTER — Encounter: Payer: 59 | Attending: General Surgery | Admitting: Registered"

## 2018-01-25 ENCOUNTER — Encounter: Payer: Self-pay | Admitting: Registered"

## 2018-01-25 DIAGNOSIS — Z833 Family history of diabetes mellitus: Secondary | ICD-10-CM | POA: Insufficient documentation

## 2018-01-25 DIAGNOSIS — Z794 Long term (current) use of insulin: Secondary | ICD-10-CM | POA: Diagnosis not present

## 2018-01-25 DIAGNOSIS — Z713 Dietary counseling and surveillance: Secondary | ICD-10-CM | POA: Insufficient documentation

## 2018-01-25 DIAGNOSIS — K5790 Diverticulosis of intestine, part unspecified, without perforation or abscess without bleeding: Secondary | ICD-10-CM | POA: Diagnosis not present

## 2018-01-25 DIAGNOSIS — E119 Type 2 diabetes mellitus without complications: Secondary | ICD-10-CM | POA: Diagnosis not present

## 2018-01-25 DIAGNOSIS — Z8379 Family history of other diseases of the digestive system: Secondary | ICD-10-CM | POA: Diagnosis not present

## 2018-01-25 DIAGNOSIS — E7800 Pure hypercholesterolemia, unspecified: Secondary | ICD-10-CM | POA: Insufficient documentation

## 2018-01-25 DIAGNOSIS — Z79899 Other long term (current) drug therapy: Secondary | ICD-10-CM | POA: Insufficient documentation

## 2018-01-25 DIAGNOSIS — K219 Gastro-esophageal reflux disease without esophagitis: Secondary | ICD-10-CM | POA: Diagnosis not present

## 2018-01-25 DIAGNOSIS — Z6839 Body mass index (BMI) 39.0-39.9, adult: Secondary | ICD-10-CM | POA: Diagnosis not present

## 2018-01-25 DIAGNOSIS — Z8249 Family history of ischemic heart disease and other diseases of the circulatory system: Secondary | ICD-10-CM | POA: Insufficient documentation

## 2018-01-25 DIAGNOSIS — H6900 Patulous Eustachian tube, unspecified ear: Secondary | ICD-10-CM | POA: Diagnosis not present

## 2018-01-25 DIAGNOSIS — K912 Postsurgical malabsorption, not elsewhere classified: Secondary | ICD-10-CM | POA: Diagnosis not present

## 2018-01-25 DIAGNOSIS — H6980 Other specified disorders of Eustachian tube, unspecified ear: Secondary | ICD-10-CM | POA: Diagnosis not present

## 2018-01-25 DIAGNOSIS — Z87891 Personal history of nicotine dependence: Secondary | ICD-10-CM | POA: Insufficient documentation

## 2018-01-25 NOTE — Progress Notes (Signed)
Follow-up visit: 6 Months Post-Operative RYGB Surgery  Medical Nutrition Therapy:  Appt start time: 8:35 end time: 9:15.  Primary concerns today: Post-operative Bariatric Surgery Nutrition Management.  Non scale victories: no longer taking insulin, wearing regular sizes,   Surgery date: 08/07/2017 Surgery type: RYGB Start weight at [facility]: 256.3 Weight today: 182.0 lbs Weight change: 22.2 lbs loss from 204.2 (10/19/2017) Total weight lost: 74.3 lbs Weight loss goal: eliminate insulin   TANITA  BODY COMP RESULTS  10/04/2017 10/19/2017 01/25/2018   BMI (kg/m^2) 32.2 31.0 27.7   Fat Mass (lbs) 85.8 81.2 63.0   Fat Free Mass (lbs) 126.0 123.0 119.0   Total Body Water (lbs) 90.8 88.4 84.8   Pt states she has learned to eat better. Pt states she has bowel movements once/week; discussed with surgeon and will begin taking Miralax daily. Pt states she wanted to experience dumping syndorme once to know not to eat certain things; experienced it with fried chicken nuggets. Pt states she is fearful of introducing potatoes back into her diet. Pt states she is not weight conscious and knows she will always look in the mirror and see herself as fat. Pt states she has not been able to work out recently due to work-life balance; plans to do more during summer when daughter is out for the summer.   Pt states she is getting at least 60 grams daily and at least 64 ounces of fluid a day. Pt states she can tolerate Pro[employer] health chewable multivitamin. Pt is doing well with habits established.   Preferred Learning Style:   No preference indicated   Learning Readiness:   Ready  Change in progress  24-hr recall: B (AM): boiled egg (6g), [place] links (7g) or protein shake (30g) or protein bar Snk (AM): greek yogurt (15g) or egg (6g) L (PM): [place] [employer]- 1/2 sub in tub (20g) or  [place] breast (14g), cheese (6g) + sometimes vegetables Snk (PM): string cheese (6g) or greek yogurt (15g) D (PM): [place]  Mike[employer]- 1/2 sub in tub (20g) or taco meat, beans, rotel + vegetables Snk (PM): none  Fluid intake: decaf tea, water with flavor packs; 64+ ounces Estimated total protein intake: 60+ grams  Medications: See list; no longer taking insulin Supplementation: ProCare chewable + 3 TUMS  CBG monitoring: yes, 2x/day Average CBG per patient: <120 Last patient reported A1c: 5.7 in Dec/Jan  Using straws: no Drinking while eating: no  Having you been chewing well: yes Chewing/swallowing difficulties: no Changes in vision: no Changes to mood/headaches: no Hair loss/Changes to skin/Changes to nails: no, some facial breakouts, no Any difficulty focusing or concentrating: no Sweating: night sweats sometimes Dizziness/Lightheaded: no Palpitations: no  Carbonated beverages: no N/V/D/C/GAS: no, no, no, yes-will start taking Miralax, yes Abdominal Pain: no Dumping syndrome: yes, with chicken nuggets, anything breaded-deep fried, baked, air fried Last Lap-Band fill: N/A  Recent physical activity: Taking stairs at work to 3rd floor  Progress Towards Goal(s):  In progress.  Handouts given during visit include:  Phase IV: High Protein + vegetables   Nutritional Diagnosis:  [place]-3.3 Overweight/obesity related to past poor dietary habits and physical inactivity as evidenced by patient w/ recent  RYGB surgery following dietary guidelines for continued weight loss.    Intervention:  Nutrition education and counseling.  Goals: - Aim to have vegetables with lunch and dinner.  - Introduce starchy vegetables such as corn, peas, and potatoes with protein and non-starchy vegetables.  - Continue with great habits already established.   Teaching  Method Utilized:  Visual Auditory Hands on  Barriers to learning/adherence to lifestyle change: work-life balance  Demonstrated degree of understanding via:  Teach Back   Monitoring/Evaluation:  Dietary intake, exercise, lap band fills, and body weight.  Follow up in 3 months for 9 month post-op visit.

## 2018-01-25 NOTE — Patient Instructions (Addendum)
-   Aim to have vegetables with lunch and dinner.   - Introduce starchy vegetables such as corn, peas, and potatoes with protein and non-starchy vegetables.   - Continue with great habits already established.

## 2018-01-29 ENCOUNTER — Telehealth: Payer: Self-pay

## 2018-01-29 ENCOUNTER — Other Ambulatory Visit: Payer: Self-pay | Admitting: Obstetrics and Gynecology

## 2018-01-29 MED ORDER — NORETHIN ACE-ETH ESTRAD-FE 1-20 MG-MCG PO TABS: 1.0000 | ORAL_TABLET | Freq: Every day | ORAL | 3 refills | Status: DC

## 2018-01-29 NOTE — Telephone Encounter (Signed)
OCP change when due to start new pill pack. Rx eRxd. F/u at 9/19 annual/sooner prn. RN to notify pt.

## 2018-01-29 NOTE — Progress Notes (Signed)
Rx change to junel 1/20 from aviane due to BTB. F/u at 9/19 annual.

## 2018-01-29 NOTE — Telephone Encounter (Signed)
Pt aware.

## 2018-01-29 NOTE — Telephone Encounter (Signed)
Pt saw [facility] in January and rcvd rx for Aviane. She has been having random periods since she started taking it. She thought they would level out but sometimes she goes 5-6 wks w/o a period & then she will have another one in 2 wks. It's lasting longer this time. Pt inquiring if she needs a different OCP (stronger or different type). VW#[phone]

## 2018-02-01 ENCOUNTER — Ambulatory Visit (INDEPENDENT_AMBULATORY_CARE_PROVIDER_SITE_OTHER): Payer: 59 | Admitting: Family Medicine

## 2018-02-01 ENCOUNTER — Encounter: Payer: Self-pay | Admitting: Family Medicine

## 2018-02-01 VITALS — BP 102/78 | HR 78 | Temp 98.7°F | Resp 12 | Ht 67.38 in | Wt 183.1 lb

## 2018-02-01 DIAGNOSIS — E119 Type 2 diabetes mellitus without complications: Secondary | ICD-10-CM | POA: Diagnosis not present

## 2018-02-01 DIAGNOSIS — E034 Atrophy of thyroid (acquired): Secondary | ICD-10-CM

## 2018-02-01 DIAGNOSIS — E663 Overweight: Secondary | ICD-10-CM | POA: Diagnosis not present

## 2018-02-01 DIAGNOSIS — Z1239 Encounter for other screening for malignant neoplasm of breast: Secondary | ICD-10-CM

## 2018-02-01 DIAGNOSIS — Z794 Long term (current) use of insulin: Secondary | ICD-10-CM

## 2018-02-01 DIAGNOSIS — I10 Essential (primary) hypertension: Secondary | ICD-10-CM

## 2018-02-01 DIAGNOSIS — Z0001 Encounter for general adult medical examination with abnormal findings: Secondary | ICD-10-CM

## 2018-02-01 DIAGNOSIS — R200 Anesthesia of skin: Secondary | ICD-10-CM | POA: Diagnosis not present

## 2018-02-01 DIAGNOSIS — Z0000 Encounter for general adult medical examination without abnormal findings: Secondary | ICD-10-CM

## 2018-02-01 NOTE — Assessment & Plan Note (Signed)
Patient is status post bariatric surgery; she has lost 70+ pounds; monitored by bariatric surgeon

## 2018-02-01 NOTE — Assessment & Plan Note (Signed)
Endo has turned her over to me; will check TSH today

## 2018-02-01 NOTE — Progress Notes (Signed)
Patient ID: Amber Stanley, female   DOB: [birth year], 47 y.o.   MRN: [id number]   Subjective:   Amber Stanley is a 47 y.o. female here for a complete physical exam  Interim issues since last visit: had bariatric surgery; patient says surgeon just checked vitamins and CBC and CMP; her endocrinologist took her off of insulin; last A1c was 5.7 in April; endo turned her loose to me now for management of her diabetes and thyroid; patient stopped her own BP med because BP was dropping and she was symptomatic (with weight loss); her asthma is so much better with weight loss  USPSTF grade A and B recommendations Depression:  Depression screen [facility] 2/9 02/01/2018 01/25/2018 10/19/2017 10/04/2017 08/20/2017  Decreased Interest 0 0 0 0 0  Down, Depressed, Hopeless 0 0 0 0 0  PHQ - 2 Score 0 0 0 0 0   Hypertension: controlled BP Readings from Last 3 Encounters:  02/01/18 102/78  08/23/17 110/60  08/20/17 132/72   Obesity: Wt Readings from Last 3 Encounters:  02/01/18 183 lb 1.6 oz (83.1 kg)  01/25/18 182 lb (82.6 kg)  10/19/17 204 lb 3.2 oz (92.6 kg)   BMI Readings from Last 3 Encounters:  02/01/18 28.36 kg/m  01/25/18 27.67 kg/m  10/19/17 31.05 kg/m    Skin cancer: nothing worrisome Lung cancer:  Former smoker, quit 2007 Breast cancer: CBE through GYN, mammogram orders through me Colorectal cancer: no fam hx Cervical cancer screening: through GYN BRCA gene screening: family hx of breast and/or ovarian cancer and/or pancreatic cancer and/or metastatic prostate cancer? NO HIV, hep B, hep C: not interested STD testing and prevention (chl/gon/syphilis): not interested Intimate partner violence: no abuse Contraception: using OCP Osteoporosis: no hx of excessive steroid use Fall prevention/vitamin D: discussed; not taking supplement; not much sun, but will spend more time in the sun and taking vitamin after surgery Immunizations: UTD Diet: right now calcium is cheese and milk and taking  supplement; veggies, just started back on starchy veggies; protein Exercise: more active; will work up; 6k steps a day now Alcohol: no Tobacco use: former AAA: n/a Aspirin: never after surgery Glucose:  Glucose  Date Value Ref Range Status  06/04/2017 148 (H) 65 - 99 mg/dL Final  01/19/2016 158 (H) 65 - 99 mg/dL Final   Glucose, Bld  Date Value Ref Range Status  08/08/2017 207 (H) 65 - 99 mg/dL Final  08/03/2017 198 (H) 65 - 99 mg/dL Final   Glucose-Capillary  Date Value Ref Range Status  08/08/2017 187 (H) 65 - 99 mg/dL Final  08/08/2017 185 (H) 65 - 99 mg/dL Final  08/08/2017 203 (H) 65 - 99 mg/dL Final   Lipids:  Lab Results  Component Value Date   CHOL 171 01/31/2017   CHOL 161 01/19/2016   Lab Results  Component Value Date   HDL 31 (L) 01/31/2017   HDL 28 (L) 01/19/2016   Lab Results  Component Value Date   LDLCALC 100 (H) 01/31/2017   LDLCALC 96 01/19/2016   Lab Results  Component Value Date   TRIG 200 (H) 01/31/2017   TRIG 183 (H) 01/19/2016   Lab Results  Component Value Date   CHOLHDL 5.5 (H) 01/31/2017   No results found for: LDLDIRECT   Past Medical History:  Diagnosis Date  . Anemia   . Asthma   . Diabetes mellitus without complication ([place])   . Diverticulosis 08/2016  . GERD (gastroesophageal reflux disease)   . Heart murmur   .  Hypercholesterolemia 07/03/2011  . Hypertension   . Hypothyroidism   . Morbid obesity ([place]) 08/26/2015  . Pneumonia 06/2017  . Polycystic ovaries   . Reflux   . Thyroid disease    Past Surgical History:  Procedure Laterality Date  . BILATERAL CARPAL TUNNEL RELEASE  2007  . CESAREAN SECTION  10/09/2010   FTP after IOL then had a PPH at [facility]  . CHOLECYSTECTOMY  1999  . COLONOSCOPY WITH PROPOFOL N/A 09/05/2016   Procedure: COLONOSCOPY WITH PROPOFOL;  Surgeon: Jonathon Bellows, MD;  Location: [facility] ENDOSCOPY;  Service: Endoscopy;  Laterality: N/A;. Diverticulosis  . DILATION AND CURETTAGE OF UTERUS  08/18/2003    endometrial polyps and hyperplasia  . ESOPHAGOGASTRODUODENOSCOPY (EGD) WITH PROPOFOL N/A 09/05/2016   Procedure: ESOPHAGOGASTRODUODENOSCOPY (EGD) WITH PROPOFOL;  Surgeon: Jonathon Bellows, MD;  Location: [facility] ENDOSCOPY;  Service: Endoscopy;  Laterality: N/A;  . 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 SESMOIDECTOMY Left    fractured sesamoid bone removed from foot   Family History  Problem Relation Age of Onset  . Diabetes Mother   . Hypertension Mother   . Heart attack Mother 15       died from MI  . Heart disease Sister 17  . Cancer Brother 17       leukemia  . Stroke Maternal Grandmother   . Hypertension Maternal Grandmother   . Diabetes Paternal Grandmother   . Hypertension Paternal Grandmother   . Stroke Paternal Grandmother        mini strokes  . Ulcers Maternal Grandfather   . Lymphoma Cousin   . Hypertension Maternal Uncle   . Kidney failure Maternal Uncle   . Thyroid disease Paternal Aunt   . Multiple sclerosis Paternal Aunt   . COPD Neg Hx   . Breast cancer Neg Hx    Social History   Tobacco Use  . Smoking status: Former Smoker    Packs/day: 1.50    Years: 20.00    Pack years: 30.00    Types: Cigarettes    Last attempt to quit: 09/10/2005    Years since quitting: 12.4  . Smokeless tobacco: Never Used  . Tobacco comment: quit 7 years ago   Substance Use Topics  . Alcohol use: Yes    Comment: rarely  . Drug use: No     Office Visit from 02/01/2018 in [facility]  AUDIT-C Score  1      Review of Systems  Constitutional: Negative for fever and unexpected weight change (not unexpected, after surgery).  HENT: Negative for hearing loss.   Eyes: Negative for visual disturbance.  Respiratory: Negative for wheezing.   Cardiovascular: Negative for chest pain and leg swelling.  Gastrointestinal:  Negative for blood in stool.  Endocrine: Negative for polydipsia.  Genitourinary: Negative for hematuria.  Musculoskeletal: Negative for arthralgias.  Skin: Positive for rash (considering derm visit; itchy rash on chin, sides of the face).  Allergic/Immunologic: Negative for food allergies.  Neurological: Positive for numbness (along the LEFT leg hip down to foot). Negative for syncope.  Hematological: Does not bruise/bleed easily.    Objective:   Vitals:   02/01/18 0829  BP: 102/78  Pulse: 78  Resp: 12  Temp: 98.7 F (37.1 C)  TempSrc: Oral  SpO2: 96%  Weight: 183 lb 1.6 oz (83.1 kg)  Height: 5' 7.38" (1.711 m)  Body mass index is 28.36 kg/m. Wt Readings from Last 3 Encounters:  02/01/18 183 lb 1.6 oz (83.1 kg)  01/25/18 182 lb (82.6 kg)  10/19/17 204 lb 3.2 oz (92.6 kg)   Physical Exam  Constitutional: She appears well-developed and well-nourished.  Significant weight loss noted  HENT:  Head: Normocephalic and atraumatic.  Right Ear: Hearing, tympanic membrane, external ear and ear canal normal.  Left Ear: Hearing, tympanic membrane, external ear and ear canal normal.  Eyes: Conjunctivae and EOM are normal. Right eye exhibits no hordeolum. Left eye exhibits no hordeolum. No scleral icterus.  Neck: Carotid bruit is not present. No thyromegaly present.  Cardiovascular: Normal rate, regular rhythm, S1 normal, S2 normal and normal heart sounds.  No extrasystoles are present.  Pulses:      Dorsalis pedis pulses are 1+ on the right side, and 1+ on the left side.  Pulmonary/Chest: Effort normal and breath sounds normal. No respiratory distress. She has no wheezes.  Abdominal: Soft. Normal appearance and bowel sounds are normal. She exhibits no distension, no abdominal bruit, no pulsatile midline mass and no mass. There is no hepatosplenomegaly. There is no tenderness. No hernia.  Musculoskeletal: Normal range of motion. She exhibits no edema.       Right foot: There is no  deformity.       Left foot: There is no deformity.  Feet:  Right Foot:  Protective Sensation: 5 sites tested. 5 sites sensed.  Skin Integrity: Negative for ulcer, skin breakdown or callus.  Left Foot:  Protective Sensation: 5 sites tested. 5 sites sensed.  Skin Integrity: Negative for ulcer, skin breakdown or callus.  Lymphadenopathy:       Head (right side): No submandibular adenopathy present.       Head (left side): No submandibular adenopathy present.    She has no cervical adenopathy.  Neurological: She is alert. She displays no tremor. No cranial nerve deficit. She exhibits normal muscle tone. Gait normal.  Reflex Scores:      Patellar reflexes are 1+ on the right side and 1+ on the left side. Diminished sensation along LEFT lateral leg to monofilament testing  Skin: Skin is warm and dry. No bruising and no ecchymosis noted. No cyanosis. No pallor.  Psychiatric: Her speech is normal and behavior is normal. Thought content normal. Her mood appears not anxious. She does not exhibit a depressed mood.   Diabetic Foot Form - Detailed   Diabetic Foot Exam - detailed Diabetic Foot exam was performed with the following findings:  Yes 02/01/2018  8:56 AM  Visual Foot Exam completed.:  Yes  Pulse Foot Exam completed.:  Yes  Right Dorsalis Pedis:  Present Left Dorsalis Pedis:  Present  Sensory Foot Exam Completed.:  Yes Semmes-Weinstein Monofilament Test R Site 1-Great Toe:  Pos L Site 1-Great Toe:  Pos        Assessment/Plan:   Problem List Items Addressed This Visit      Cardiovascular and Mediastinum   Essential hypertension (Chronic)    Improved after weight loss        Endocrine   Type 2 diabetes mellitus (HCC) (Chronic)    Check urine microalb:Cr; foot exam by MD; requesting eye exam      Relevant Orders   Microalbumin / creatinine urine ratio   Hypothyroidism (Chronic)    Endo has turned her over to me; will check TSH today      Relevant Medications    levothyroxine (SYNTHROID, LEVOTHROID) 50 MCG tablet  Other   Preventative health care - Primary    USPSTF grade A and B recommendations reviewed with patient; age-appropriate recommendations, preventive care, screening tests, etc discussed and encouraged; healthy living encouraged; see AVS for patient education given to patient       Relevant Orders   Lipid panel   TSH   Overweight (BMI 25.0-29.9)    Patient is status post bariatric surgery; she has lost 70+ pounds; monitored by bariatric surgeon       Other Visit Diagnoses    Screening for breast cancer       Relevant Orders   MM 3D SCREEN BREAST BILATERAL   Numbness in left leg       patient may call back for referral to neuro if not improving       No orders of the defined types were placed in this encounter.  Orders Placed This Encounter  Procedures  . MM 3D SCREEN BREAST BILATERAL    Standing Status:   Future    Standing Expiration Date:   04/04/2019    Order Specific Question:   Reason for Exam (SYMPTOM  OR DIAGNOSIS REQUIRED)    Answer:   screen for breast cancer    Order Specific Question:   Is the patient pregnant?    Answer:   No    Order Specific Question:   Preferred imaging location?    Answer:   [facility]  . Lipid panel  . TSH  . Microalbumin / creatinine urine ratio    Follow up plan: Return in about 1 year (around 02/02/2019) for complete physical; 3 months for diabetes.  An After Visit Summary was printed and given to the patient.

## 2018-02-01 NOTE — Assessment & Plan Note (Signed)
USPSTF grade A and B recommendations reviewed with patient; age-appropriate recommendations, preventive care, screening tests, etc discussed and encouraged; healthy living encouraged; see AVS for patient education given to patient  

## 2018-02-01 NOTE — Assessment & Plan Note (Signed)
Check urine microalb:Cr; foot exam by MD; requesting eye exam

## 2018-02-01 NOTE — Patient Instructions (Addendum)
Please have labs done Keep up the amazing job Health Maintenance, Female Adopting a healthy lifestyle and getting preventive care can go a long way to promote health and wellness. Talk with your health care provider about what schedule of regular examinations is right for you. This is a good chance for you to check in with your provider about disease prevention and staying healthy. In between checkups, there are plenty of things you can do on your own. Experts have done a lot of research about which lifestyle changes and preventive measures are most likely to keep you healthy. Ask your health care provider for more information. Weight and diet Eat a healthy diet  Be sure to include plenty of vegetables, fruits, low-fat dairy products, and lean protein.  Do not eat a lot of foods high in solid fats, added sugars, or salt.  Get regular exercise. This is one of the most important things you can do for your health. ? Most adults should exercise for at least 150 minutes each week. The exercise should increase your heart rate and make you sweat (moderate-intensity exercise). ? Most adults should also do strengthening exercises at least twice a week. This is in addition to the moderate-intensity exercise.  Maintain a healthy weight  Body mass index (BMI) is a measurement that can be used to identify possible weight problems. It estimates body fat based on height and weight. Your health care provider can help determine your BMI and help you achieve or maintain a healthy weight.  For females 6 years of age and older: ? A BMI below 18.5 is considered underweight. ? A BMI of 18.5 to 24.9 is normal. ? A BMI of 25 to 29.9 is considered overweight. ? A BMI of 30 and above is considered obese.  Watch levels of cholesterol and blood lipids  You should start having your blood tested for lipids and cholesterol at 47 years of age, then have this test every 5 years.  You may need to have your cholesterol  levels checked more often if: ? Your lipid or cholesterol levels are high. ? You are older than 47 years of age. ? You are at high risk for heart disease.  Cancer screening Lung Cancer  Lung cancer screening is recommended for adults 62-33 years old who are at high risk for lung cancer because of a history of smoking.  A yearly low-dose CT scan of the lungs is recommended for people who: ? Currently smoke. ? Have quit within the past 15 years. ? Have at least a 30-pack-year history of smoking. A pack year is smoking an average of one pack of cigarettes a day for 1 year.  Yearly screening should continue until it has been 15 years since you quit.  Yearly screening should stop if you develop a health problem that would prevent you from having lung cancer treatment.  Breast Cancer  Practice breast self-awareness. This means understanding how your breasts normally appear and feel.  It also means doing regular breast self-exams. Let your health care provider know about any changes, no matter how small.  If you are in your 20s or 30s, you should have a clinical breast exam (CBE) by a health care provider every 1-3 years as part of a regular health exam.  If you are 57 or older, have a CBE every year. Also consider having a breast X-ray (mammogram) every year.  If you have a family history of breast cancer, talk to your health care provider about  genetic screening.  If you are at high risk for breast cancer, talk to your health care provider about having an MRI and a mammogram every year.  Breast cancer gene (BRCA) assessment is recommended for women who have family members with BRCA-related cancers. BRCA-related cancers include: ? Breast. ? Ovarian. ? Tubal. ? Peritoneal cancers.  Results of the assessment will determine the need for genetic counseling and BRCA1 and BRCA2 testing.  Cervical Cancer Your health care provider may recommend that you be screened regularly for cancer of  the pelvic organs (ovaries, uterus, and vagina). This screening involves a pelvic examination, including checking for microscopic changes to the surface of your cervix (Pap test). You may be encouraged to have this screening done every 3 years, beginning at age 98.  For women ages 40-65, health care providers may recommend pelvic exams and Pap testing every 3 years, or they may recommend the Pap and pelvic exam, combined with testing for human papilloma virus (HPV), every 5 years. Some types of HPV increase your risk of cervical cancer. Testing for HPV may also be done on women of any age with unclear Pap test results.  Other health care providers may not recommend any screening for nonpregnant women who are considered low risk for pelvic cancer and who do not have symptoms. Ask your health care provider if a screening pelvic exam is right for you.  If you have had past treatment for cervical cancer or a condition that could lead to cancer, you need Pap tests and screening for cancer for at least 20 years after your treatment. If Pap tests have been discontinued, your risk factors (such as having a new sexual partner) need to be reassessed to determine if screening should resume. Some women have medical problems that increase the chance of getting cervical cancer. In these cases, your health care provider may recommend more frequent screening and Pap tests.  Colorectal Cancer  This type of cancer can be detected and often prevented.  Routine colorectal cancer screening usually begins at 47 years of age and continues through 47 years of age.  Your health care provider may recommend screening at an earlier age if you have risk factors for colon cancer.  Your health care provider may also recommend using home test kits to check for hidden blood in the stool.  A small camera at the end of a tube can be used to examine your colon directly (sigmoidoscopy or colonoscopy). This is done to check for the  earliest forms of colorectal cancer.  Routine screening usually begins at age 28.  Direct examination of the colon should be repeated every 5-10 years through 47 years of age. However, you may need to be screened more often if early forms of precancerous polyps or small growths are found.  Skin Cancer  Check your skin from head to toe regularly.  Tell your health care provider about any new moles or changes in moles, especially if there is a change in a mole's shape or color.  Also tell your health care provider if you have a mole that is larger than the size of a pencil eraser.  Always use sunscreen. Apply sunscreen liberally and repeatedly throughout the day.  Protect yourself by wearing long sleeves, pants, a wide-brimmed hat, and sunglasses whenever you are outside.  Heart disease, diabetes, and high blood pressure  High blood pressure causes heart disease and increases the risk of stroke. High blood pressure is more likely to develop in: ? People  who have blood pressure in the high end of the normal range (130-139/85-89 mm Hg). ? People who are overweight or obese. ? People who are African American.  If you are 5-22 years of age, have your blood pressure checked every 3-5 years. If you are 69 years of age or older, have your blood pressure checked every year. You should have your blood pressure measured twice-once when you are at a hospital or clinic, and once when you are not at a hospital or clinic. Record the average of the two measurements. To check your blood pressure when you are not at a hospital or clinic, you can use: ? An automated blood pressure machine at a pharmacy. ? A home blood pressure monitor.  If you are between 29 years and 27 years old, ask your health care provider if you should take aspirin to prevent strokes.  Have regular diabetes screenings. This involves taking a blood sample to check your fasting blood sugar level. ? If you are at a normal weight and  have a low risk for diabetes, have this test once every three years after 47 years of age. ? If you are overweight and have a high risk for diabetes, consider being tested at a younger age or more often. Preventing infection Hepatitis B  If you have a higher risk for hepatitis B, you should be screened for this virus. You are considered at high risk for hepatitis B if: ? You were born in a country where hepatitis B is common. Ask your health care provider which countries are considered high risk. ? Your parents were born in a high-risk country, and you have not been immunized against hepatitis B (hepatitis B vaccine). ? You have HIV or AIDS. ? You use needles to inject street drugs. ? You live with someone who has hepatitis B. ? You have had sex with someone who has hepatitis B. ? You get hemodialysis treatment. ? You take certain medicines for conditions, including cancer, organ transplantation, and autoimmune conditions.  Hepatitis C  Blood testing is recommended for: ? Everyone born from [birth year] through 1965. ? Anyone with known risk factors for hepatitis C.  Sexually transmitted infections (STIs)  You should be screened for sexually transmitted infections (STIs) including gonorrhea and chlamydia if: ? You are sexually active and are younger than 47 years of age. ? You are older than 47 years of age and your health care provider tells you that you are at risk for this type of infection. ? Your sexual activity has changed since you were last screened and you are at an increased risk for chlamydia or gonorrhea. Ask your health care provider if you are at risk.  If you do not have HIV, but are at risk, it may be recommended that you take a prescription medicine daily to prevent HIV infection. This is called pre-exposure prophylaxis (PrEP). You are considered at risk if: ? You are sexually active and do not regularly use condoms or know the HIV status of your partner(s). ? You take drugs by  injection. ? You are sexually active with a partner who has HIV.  Talk with your health care provider about whether you are at high risk of being infected with HIV. If you choose to begin PrEP, you should first be tested for HIV. You should then be tested every 3 months for as long as you are taking PrEP. Pregnancy  If you are premenopausal and you may become pregnant, ask your health care  provider about preconception counseling.  If you may become pregnant, take 400 to 800 micrograms (mcg) of folic acid every day.  If you want to prevent pregnancy, talk to your health care provider about birth control (contraception). Osteoporosis and menopause  Osteoporosis is a disease in which the bones lose minerals and strength with aging. This can result in serious bone fractures. Your risk for osteoporosis can be identified using a bone density scan.  If you are 22 years of age or older, or if you are at risk for osteoporosis and fractures, ask your health care provider if you should be screened.  Ask your health care provider whether you should take a calcium or vitamin D supplement to lower your risk for osteoporosis.  Menopause may have certain physical symptoms and risks.  Hormone replacement therapy may reduce some of these symptoms and risks. Talk to your health care provider about whether hormone replacement therapy is right for you. Follow these instructions at home:  Schedule regular health, dental, and eye exams.  Stay current with your immunizations.  Do not use any tobacco products including cigarettes, chewing tobacco, or electronic cigarettes.  If you are pregnant, do not drink alcohol.  If you are breastfeeding, limit how much and how often you drink alcohol.  Limit alcohol intake to no more than 1 drink per day for nonpregnant women. One drink equals 12 ounces of beer, 5 ounces of wine, or 1 ounces of hard liquor.  Do not use street drugs.  Do not share needles.  Ask  your health care provider for help if you need support or information about quitting drugs.  Tell your health care provider if you often feel depressed.  Tell your health care provider if you have ever been abused or do not feel safe at home. This information is not intended to replace advice given to you by your health care provider. Make sure you discuss any questions you have with your health care provider. Document Released: 02/20/2011 Document Revised: 01/13/2016 Document Reviewed: 05/11/2015 Elsevier Interactive Patient Education  [employer].

## 2018-02-01 NOTE — Assessment & Plan Note (Signed)
Improved after weight loss.

## 2018-03-26 ENCOUNTER — Ambulatory Visit
Admission: RE | Admit: 2018-03-26 | Discharge: 2018-03-26 | Disposition: A | Discharge status: Home / Self Care | Payer: 59 | Source: Ambulatory Visit | Attending: Family Medicine | Admitting: Family Medicine

## 2018-03-26 DIAGNOSIS — Z1231 Encounter for screening mammogram for malignant neoplasm of breast: Secondary | ICD-10-CM | POA: Insufficient documentation

## 2018-03-26 DIAGNOSIS — Z1239 Encounter for other screening for malignant neoplasm of breast: Secondary | ICD-10-CM

## 2018-03-27 LAB — HEMOGLOBIN A1C: Hemoglobin A1C: 5.9

## 2018-04-16 DIAGNOSIS — Z794 Long term (current) use of insulin: Secondary | ICD-10-CM | POA: Diagnosis not present

## 2018-04-16 DIAGNOSIS — Z0000 Encounter for general adult medical examination without abnormal findings: Secondary | ICD-10-CM | POA: Diagnosis not present

## 2018-04-16 DIAGNOSIS — E119 Type 2 diabetes mellitus without complications: Secondary | ICD-10-CM | POA: Diagnosis not present

## 2018-04-17 ENCOUNTER — Encounter: Payer: Self-pay | Admitting: Family Medicine

## 2018-04-17 LAB — LIPID PANEL
CHOL/HDL RATIO: 4.3 ratio (ref 0.0–4.4)
Cholesterol, Total: 160 mg/dL (ref 100–199)
HDL: 37 mg/dL — ABNORMAL LOW (ref >39)
LDL Calculated: 94 mg/dL (ref 0–99)
Triglycerides: 143 mg/dL (ref 0–149)
VLDL Cholesterol Cal: 29 mg/dL (ref 5–40)

## 2018-04-17 LAB — TSH: TSH: 0.938 u[IU]/mL (ref 0.450–4.500)

## 2018-04-17 LAB — MICROALBUMIN / CREATININE URINE RATIO: CREATININE, UR: 110.4 mg/dL

## 2018-04-26 ENCOUNTER — Encounter: Payer: Self-pay | Admitting: Registered"

## 2018-04-26 ENCOUNTER — Encounter: Payer: 59 | Attending: General Surgery | Admitting: Registered"

## 2018-04-26 DIAGNOSIS — Z8249 Family history of ischemic heart disease and other diseases of the circulatory system: Secondary | ICD-10-CM | POA: Insufficient documentation

## 2018-04-26 DIAGNOSIS — E119 Type 2 diabetes mellitus without complications: Secondary | ICD-10-CM | POA: Insufficient documentation

## 2018-04-26 DIAGNOSIS — K5790 Diverticulosis of intestine, part unspecified, without perforation or abscess without bleeding: Secondary | ICD-10-CM | POA: Insufficient documentation

## 2018-04-26 DIAGNOSIS — Z833 Family history of diabetes mellitus: Secondary | ICD-10-CM | POA: Diagnosis not present

## 2018-04-26 DIAGNOSIS — Z6839 Body mass index (BMI) 39.0-39.9, adult: Secondary | ICD-10-CM | POA: Diagnosis not present

## 2018-04-26 DIAGNOSIS — K219 Gastro-esophageal reflux disease without esophagitis: Secondary | ICD-10-CM | POA: Diagnosis not present

## 2018-04-26 DIAGNOSIS — Z713 Dietary counseling and surveillance: Secondary | ICD-10-CM | POA: Diagnosis not present

## 2018-04-26 DIAGNOSIS — Z79899 Other long term (current) drug therapy: Secondary | ICD-10-CM | POA: Insufficient documentation

## 2018-04-26 DIAGNOSIS — E7800 Pure hypercholesterolemia, unspecified: Secondary | ICD-10-CM | POA: Diagnosis not present

## 2018-04-26 DIAGNOSIS — Z87891 Personal history of nicotine dependence: Secondary | ICD-10-CM | POA: Diagnosis not present

## 2018-04-26 DIAGNOSIS — Z8379 Family history of other diseases of the digestive system: Secondary | ICD-10-CM | POA: Diagnosis not present

## 2018-04-26 DIAGNOSIS — Z794 Long term (current) use of insulin: Secondary | ICD-10-CM | POA: Diagnosis not present

## 2018-04-26 NOTE — Patient Instructions (Addendum)
-   Add in fruit once a day.   - Aim to eat fruit after protein and non-starchy vegetables.   - Keep up the great work with habits already established!

## 2018-04-26 NOTE — Progress Notes (Signed)
Follow-up visit: 9 Months Post-Operative RYGB Surgery  Medical Nutrition Therapy:  Appt start time: 8:08 end time: 8:55.  Primary concerns today: Post-operative Bariatric Surgery Nutrition Management.  Non scale victories: no longer taking insulin, wearing regular sizes, able to ride rides at amusement park while on vacation  Surgery date: 08/07/2017 Surgery type: RYGB Start weight at [facility]: 256.3 Weight today: 178.8 lbs Weight change: 3.2 lbs loss from 182.0 (01/25/2018) Total weight lost: 77.5 lbs Weight loss goal: eliminate insulin, to be healthy  TANITA  BODY COMP RESULTS  10/04/2017 10/19/2017 01/25/2018 04/26/2018   BMI (kg/m^2) 32.2 31.0 27.7 27.2   Fat Mass (lbs) 85.8 81.2 63.0 60.8   Fat Free Mass (lbs) 126.0 123.0 119.0 118.0   Total Body Water (lbs) 90.8 88.4 84.8 84.0   Pt states she has vegetables with lunch and dinner. Pt states she is no longer afraid of eating potatoes. Pt states she uses strategies taught, listens to her body and stops eating when satisfied. Pt states she has not been exercising much. Pt states she was very happy to be able to participate with daughter on amusement park rides on recent vacation due to being a smaller size. Pt states she only check BS once a day because she does not want to see 150 reading as BS because that means she will have to take insulin. Pt states she does not want to take insulin because it may cause her to experience "lows" overnight. Pt was encouraged to discuss her daily schedule and share thoughts and concerns with endocrinologist.   Pt states she is not weight conscious and knows she will always look in the mirror and see herself as fat. Pt states she is getting at least 60 grams daily and at least 64 ounces of fluid a day. Pt states she can tolerate [employer] health chewable multivitamin. Pt is doing well with habits established.   Preferred Learning Style:   No preference indicated   Learning Readiness:   Ready  Change  in progress  24-hr recall: B (AM): greek yogurt (12-15g) + nuts (7g)  Snk (AM): nuts (14g) or protein bar or protein shake (30g) L (PM): [place] Mike[employer]- 1/2 sub in tub (20g) or  [place] breast (14g), cheese (6g) + vegetables Snk (PM): string cheese (6g) or greek yogurt (15g) D (PM): [place] [employer]- 1/2 sub in tub (20g) or 2 oz meat (14g) + vegetables Snk (PM): none  Fluid intake: decaf tea, water with flavor packs, coffee; 64+ ounces Estimated total protein intake: 60+ grams  Medications: See list; no longer taking insulin Supplementation: Pro[employer] chewable + 2 TUMS + 1 chocolate calcium disc  CBG monitoring: yes, 1x/day Average CBG per patient: <120 Last patient reported A1c: 5.9 in Aug  Using straws: no Drinking while eating: no  Having you been chewing well: yes Chewing/swallowing difficulties: no Changes in vision: no Changes to mood/headaches: no Hair loss/Changes to skin/Changes to nails: no, some facial breakouts, no Any difficulty focusing or concentrating: no Sweating: no Dizziness/Lightheaded: no Palpitations: no  Carbonated beverages: no N/V/D/C/GAS: no, no, no, no, no Abdominal Pain: no Dumping syndrome: yes, with chicken nuggets, anything breaded-deep fried, baked, air fried Last Lap-Band fill: N/A  Recent physical activity: Taking stairs at work to 3rd floor + hiking sometimes + walking  Progress Towards Goal(s):  In progress.  Handouts given during visit include:  none   Nutritional Diagnosis:  [place]-3.3 Overweight/obesity related to past poor dietary habits and physical inactivity as evidenced by patient w/ recent  RYGB surgery following dietary guidelines for continued weight loss.    Intervention:  Nutrition education and counseling. Pt was educated and counseled on how to incorporate fruit intake her regimen. Pt was also encouraged to share thoughts and concerns about checking blood sugar with endocrinologist. Pt was in agreement with goals listed.   Goals: - Add in fruit once a day.  - Aim to eat fruit after protein and non-starchy vegetables.  - Keep up the great work with habits already established!.   Teaching Method Utilized:  Visual Auditory Hands on  Barriers to learning/adherence to lifestyle change: work-life balance  Demonstrated degree of understanding via:  Teach Back   Monitoring/Evaluation:  Dietary intake, exercise, lap band fills, and body weight. Follow up in 3 months for 12 month post-op visit.

## 2018-05-01 IMAGING — CR DG CHEST 2V
1 series · 2 of 2 positions shown · non-contrast
Comparison: 01/06/2013

CLINICAL DATA: Gastric bypass workup

EXAM:
CHEST  2 VIEW

[Series 1: dg chest 2 view · 0.14mm/px · 2 of 2 slices shown]
[im 1/2]
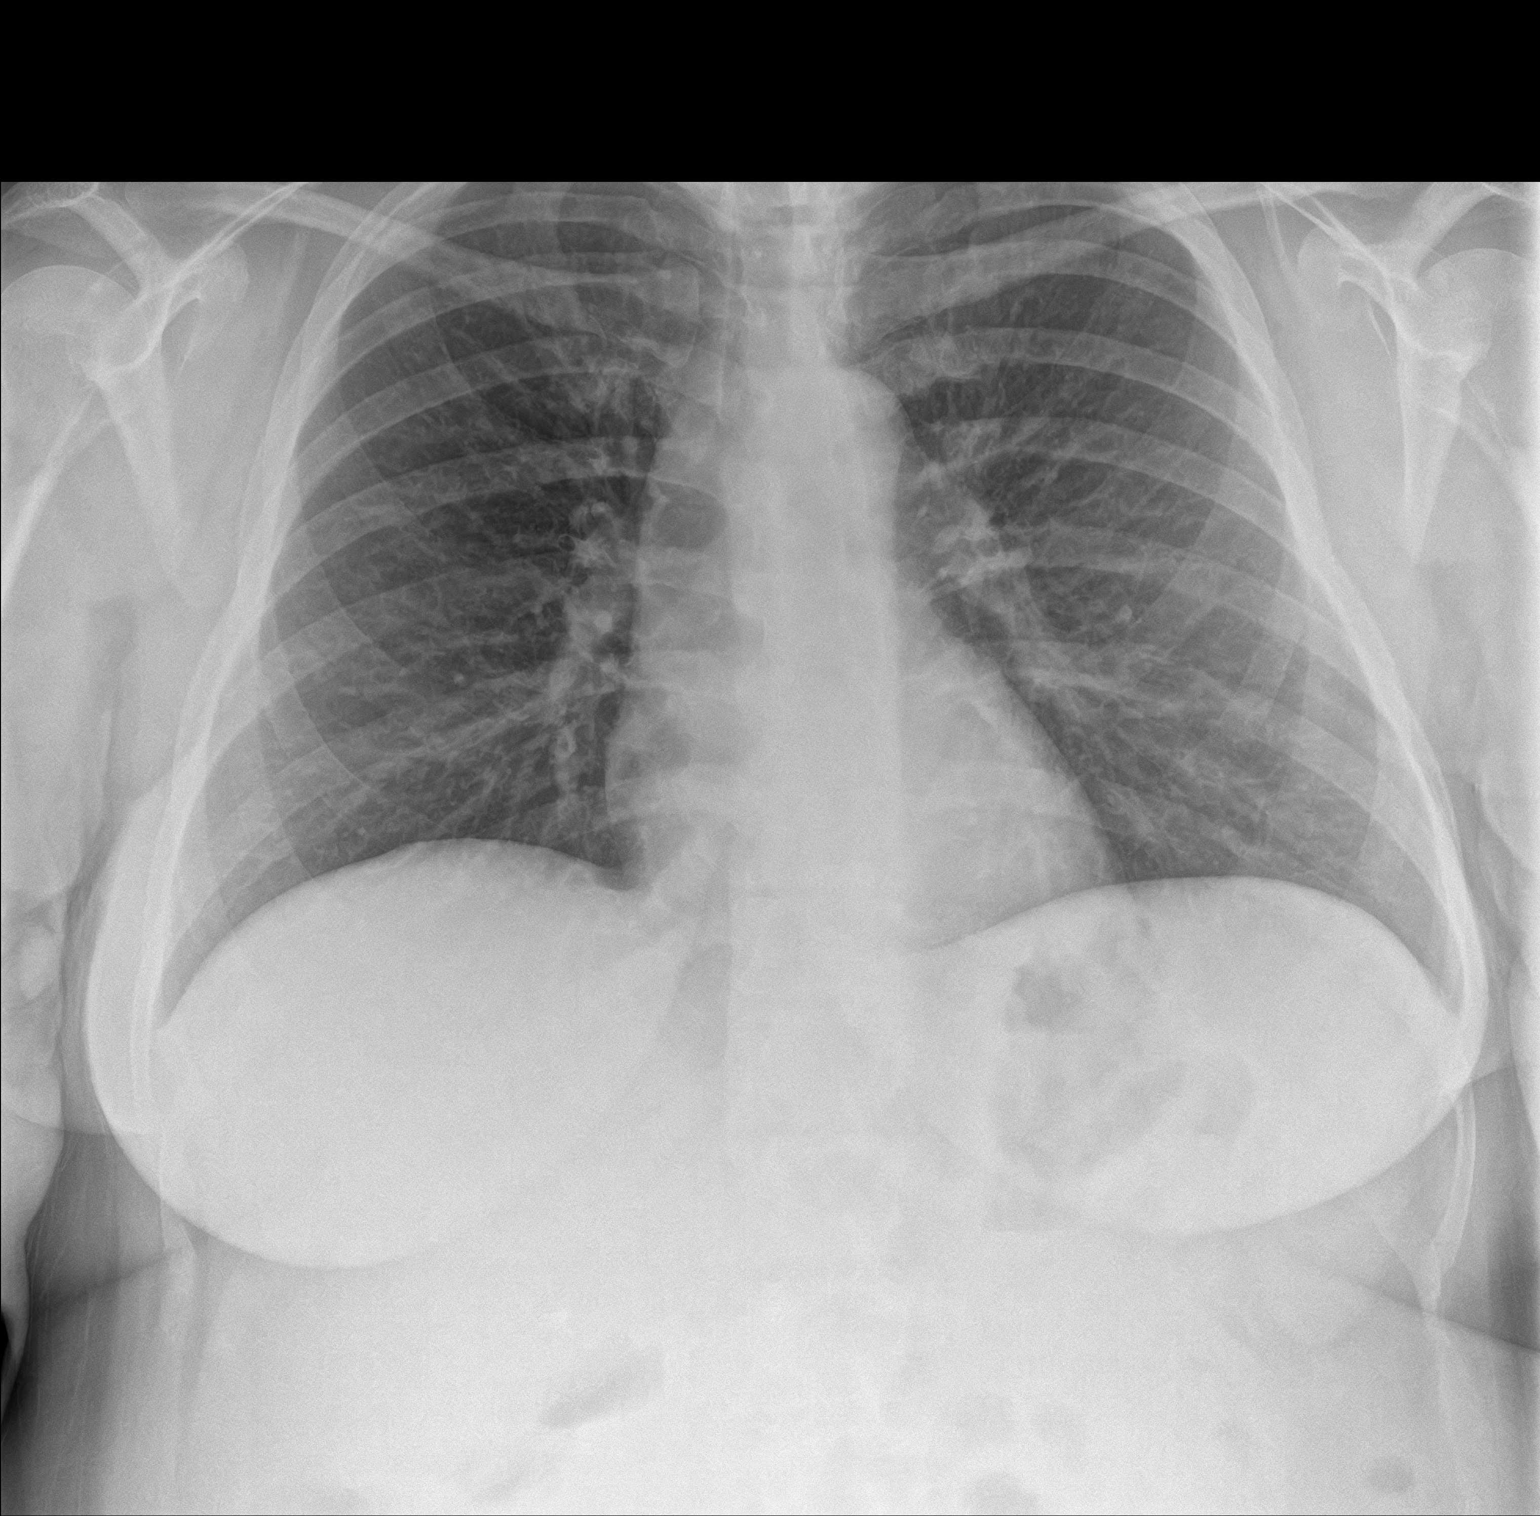
[im 2/2]
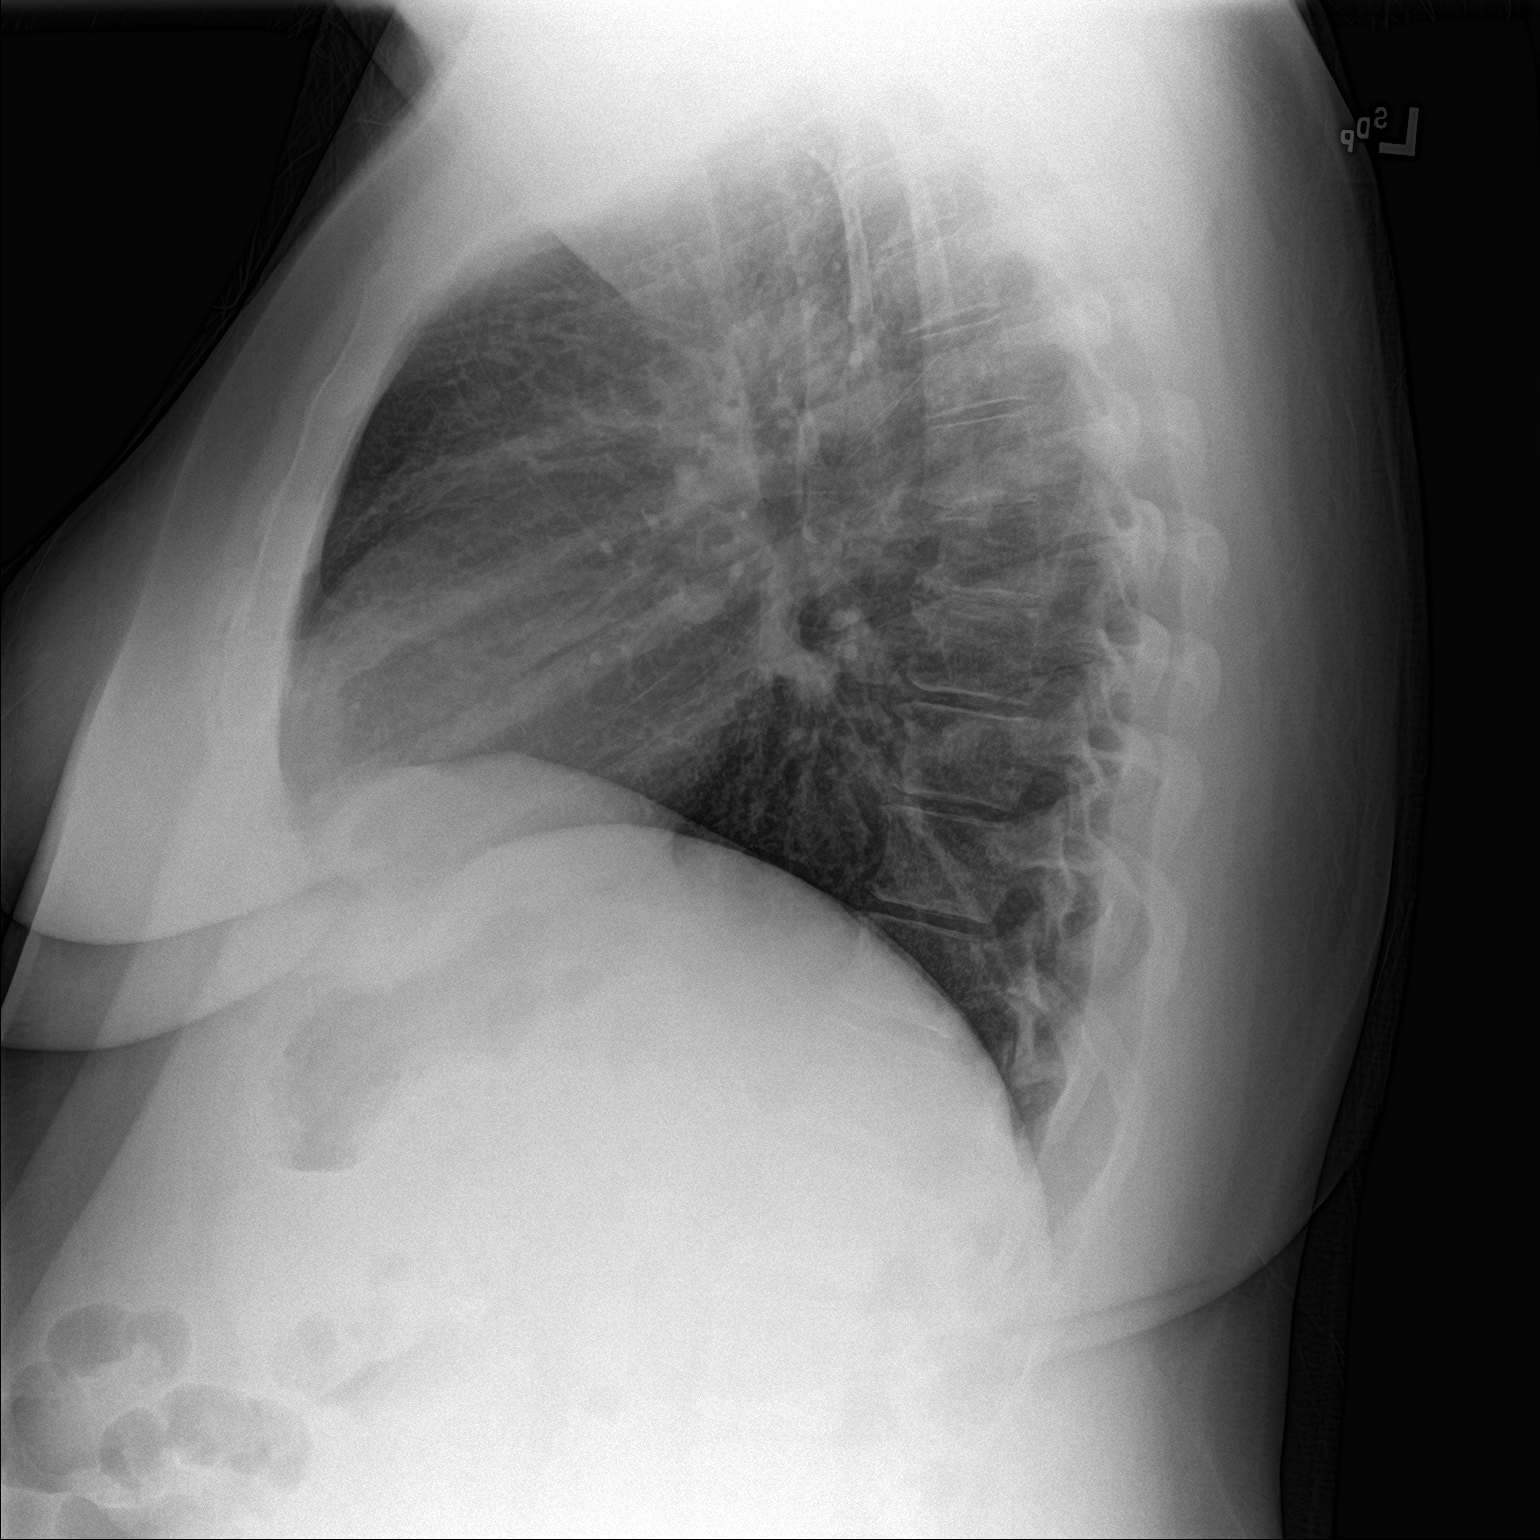

[2 of 2 positions shown; findings below may reference images not displayed]

FINDINGS: Normal heart size and mediastinal contours. No acute infiltrate or
edema. No effusion or pneumothorax. No acute osseous findings.
Cholecystectomy clips.
IMPRESSION: Negative chest.

## 2018-05-01 IMAGING — RF DG UGI W/ KUB
8 series · 8 of 8 positions shown · non-contrast
Comparison: None in PACs

CLINICAL DATA: Preoperative planning prior to gastric bypass
surgery.

EXAM:
UPPER GI SERIES WITHOUT KUB
TECHNIQUE: Routine upper GI series was performed with thin/high density/water
soluble barium. Effervescent crystals and a barium tablet were
administered.
FLUOROSCOPY TIME:  Fluoroscopy Time:  0 minutes, 54 seconds
Radiation Exposure Index (if provided by the fluoroscopic device):
2216 micro Gy per meters square
Number of Acquired Spot Images: 7+ 1 video loop.

[Series 1: fluoro_barium singleshot_bw · 0.17mm/px · 1 of 1 slices shown (1 of 8)]
[im 1/1]
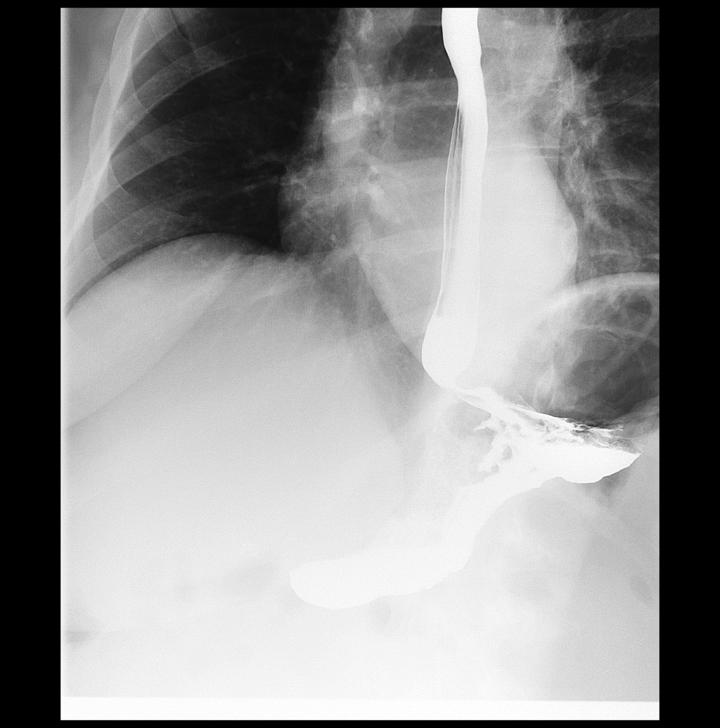

[Series 2: fluoro_barium singleshot_bw · 0.18mm/px · 1 of 1 slices shown (2 of 8)]
[im 1/1]
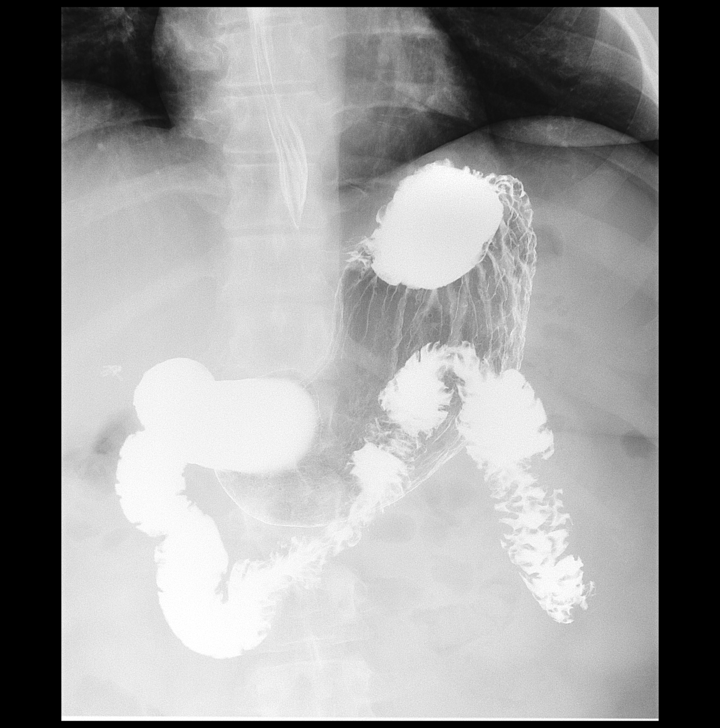

[Series 3: fluoro_barium singleshot_bw · 0.18mm/px · 1 of 1 slices shown (3 of 8)]
[im 1/1]
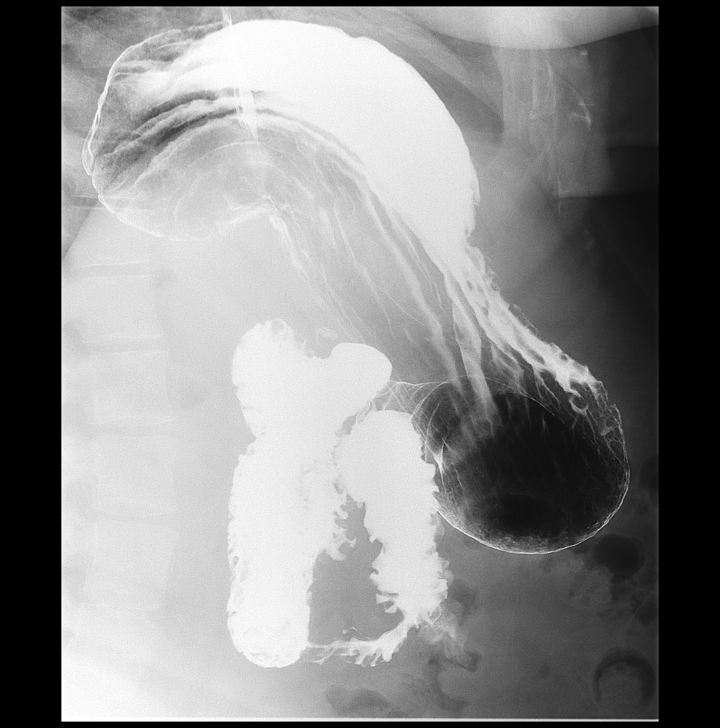

[Series 4: fluoro_barium singleshot_bw · 0.18mm/px · 1 of 1 slices shown (4 of 8)]
[im 1/1]
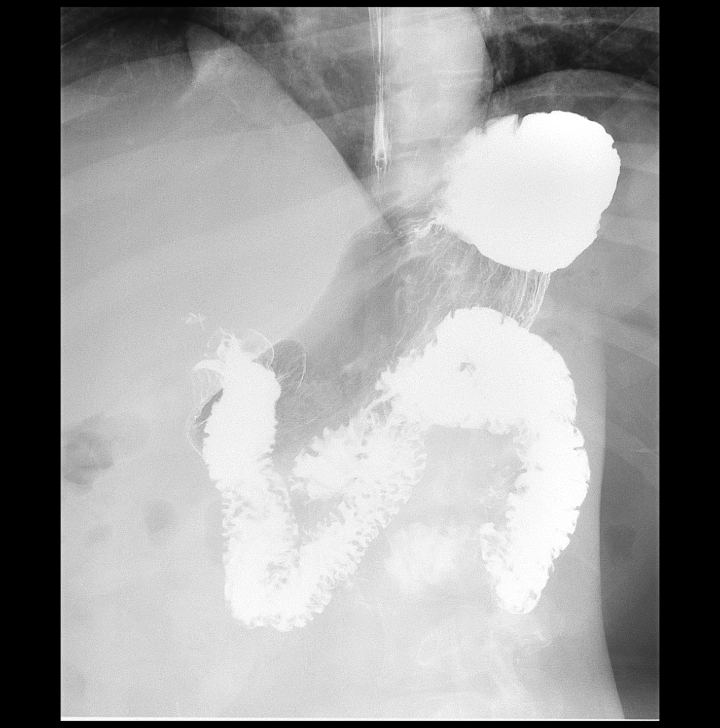

[Series 5: fluoro_barium singleshot_bw · 0.18mm/px · 1 of 1 slices shown (5 of 8)]
[im 1/1]
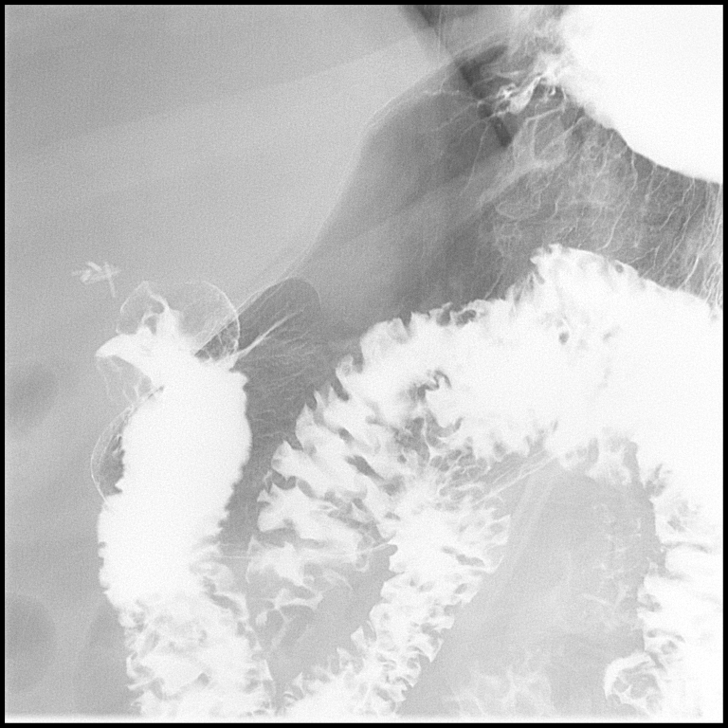

[Series 6: fluoro_barium singleshot_bw · 0.18mm/px · 1 of 1 slices shown (6 of 8)]
[im 1/1]
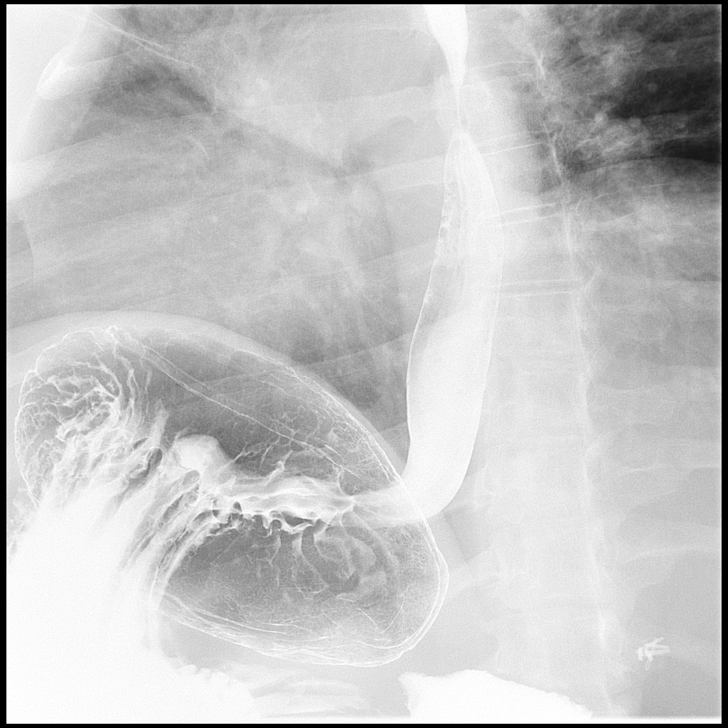

[Series 7: fluoro_barium singleshot_bw · 0.18mm/px · 1 of 1 slices shown (7 of 8)]
[im 1/1]
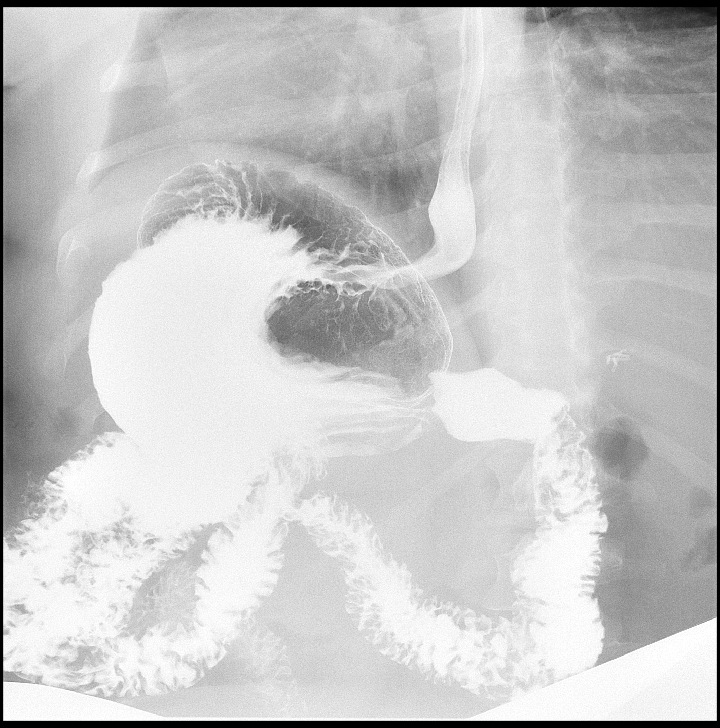

[Series 8: fluoro_barium singleshot_bw · 0.18mm/px · 1 of 1 slices shown (8 of 8)]
[im 1/1]
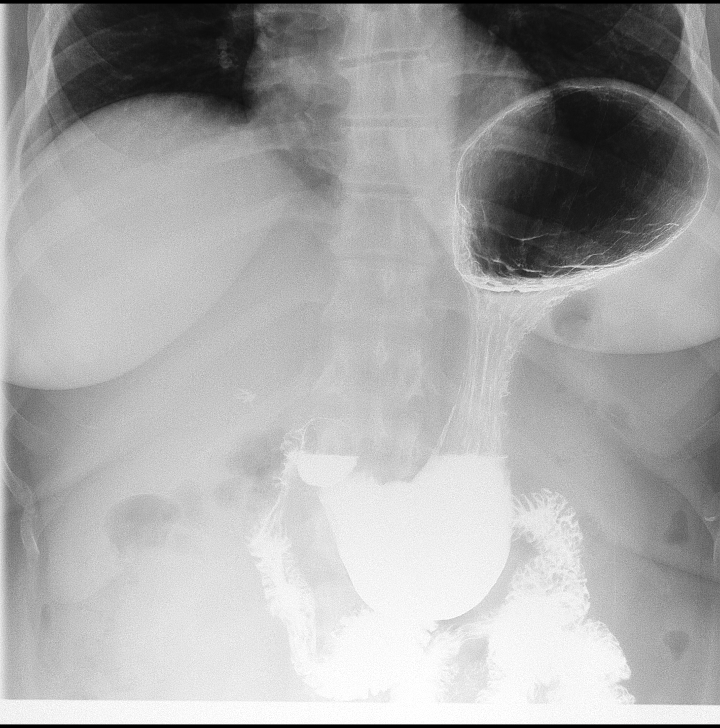

[8 of 8 positions shown; findings below may reference images not displayed]

FINDINGS: The patient ingested thick and thin barium and the gas-forming
crystals without difficulty. The thoracic esophagus was normal in a
survey fashion. There was no hiatal hernia or gastroesophageal
reflux. The barium tablet passed promptly from the mouth to the
stomach. The stomach distended well. The mucosal fold pattern was
normal. Gastric emptying was prompt. The duodenal bulb and C-loop
were normal in position and appearance. There are surgical clips in
the gallbladder fossa.
IMPRESSION: Normal upper GI series.

## 2018-05-06 IMAGING — CR DG CHEST 2V
1 series · 2 of 2 positions shown · non-contrast
Comparison: Chest x-ray of May 30, 2017

CLINICAL DATA: Shortness of breath, chest tightness, and chest
pressure with pleuritic symptoms. History of asthma, former smoker.

EXAM:
CHEST  2 VIEW

[Series 1: dg chest 2 view · 0.14mm/px · 2 of 2 slices shown]
[im 1/2]
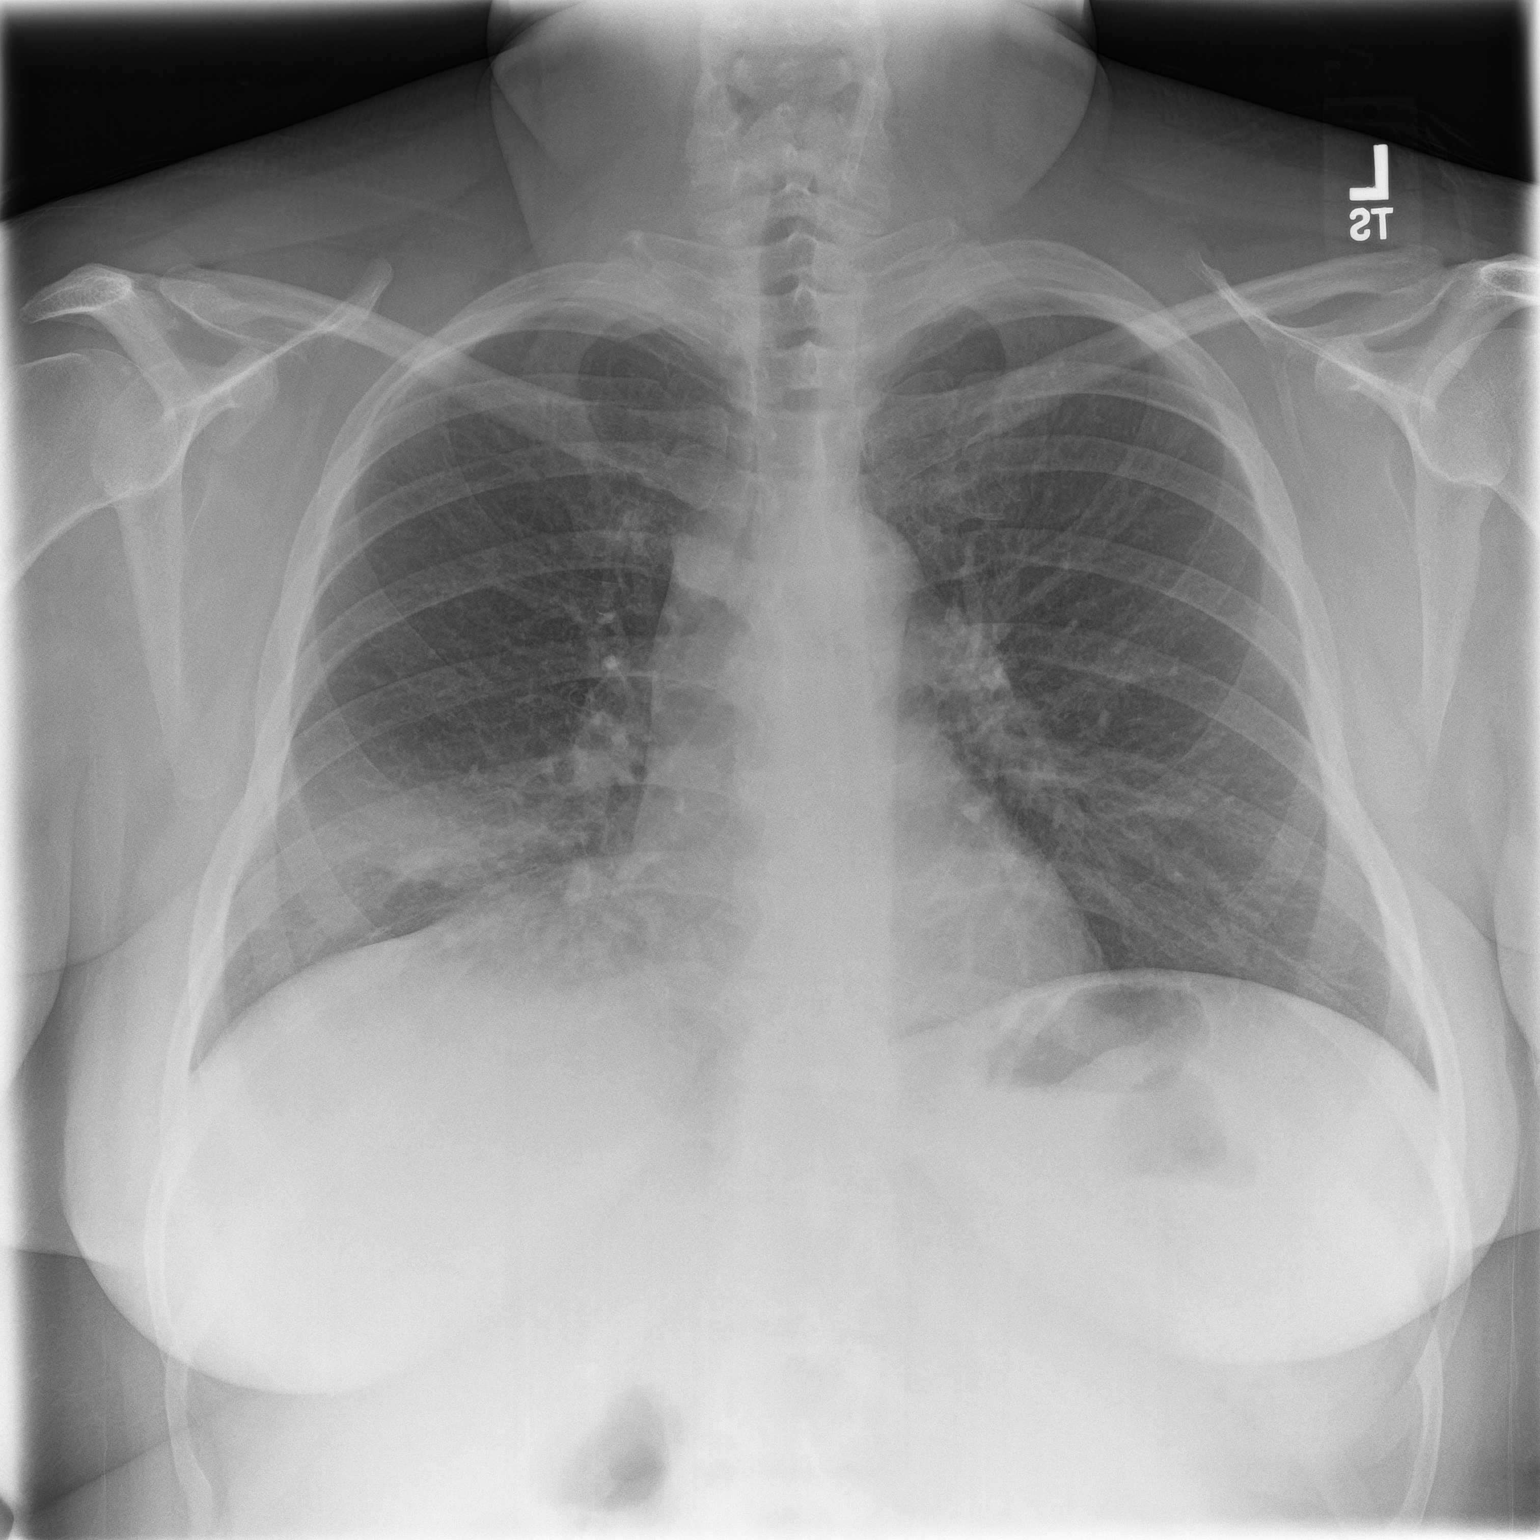
[im 2/2]
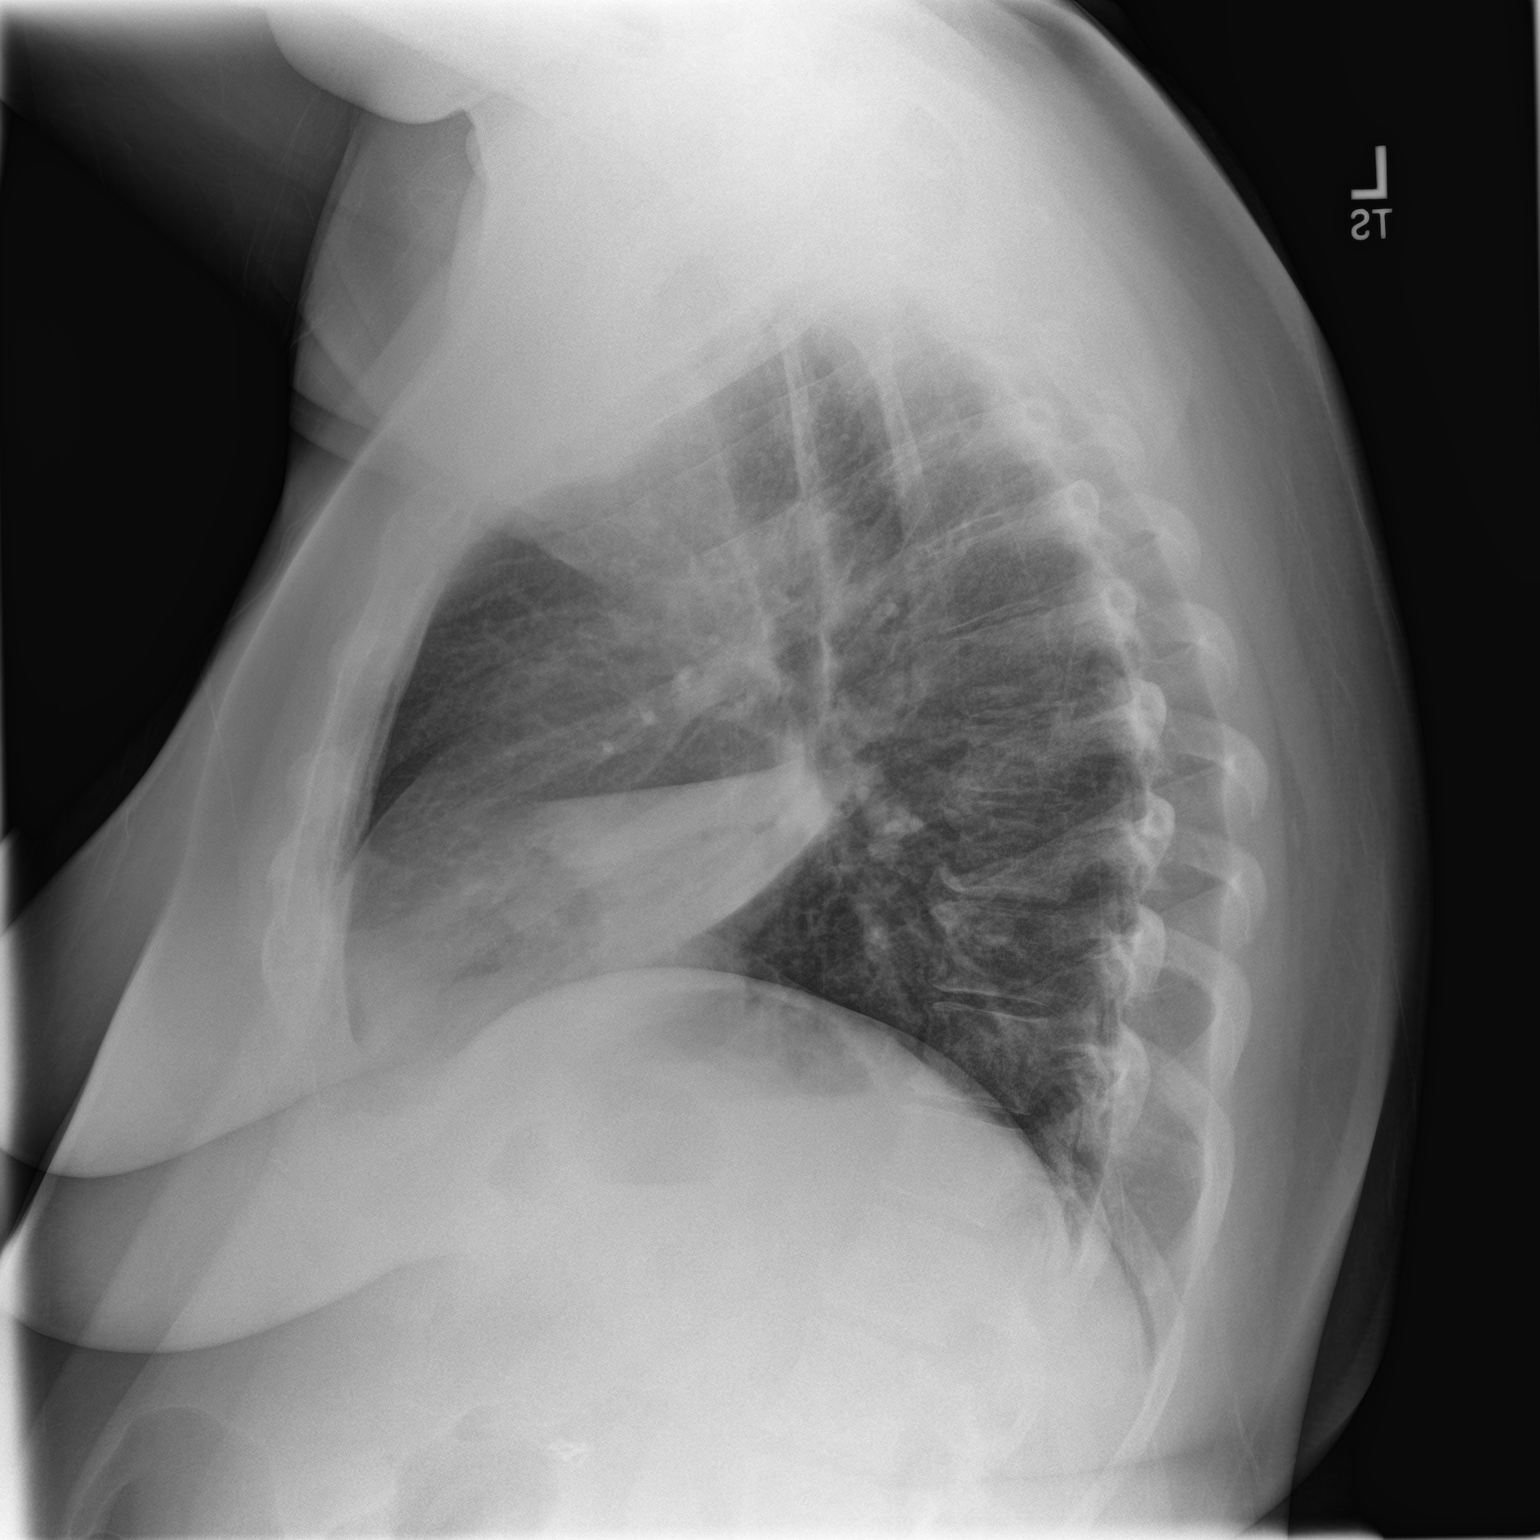

[2 of 2 positions shown; findings below may reference images not displayed]

FINDINGS: There has been interval development of dense infiltrate and partial
atelectasis of the right middle lobe. The left lung is clear. The
heart and pulmonary vascularity are normal. There is no pleural
effusion.
IMPRESSION: Dense pneumonia with associated atelectasis in the right middle
lobe. Followup PA and lateral chest X-ray is recommended in 3-4
weeks following trial of antibiotic therapy to ensure resolution and
exclude underlying malignancy.

## 2018-05-07 ENCOUNTER — Encounter: Payer: Self-pay | Admitting: Family Medicine

## 2018-05-07 ENCOUNTER — Ambulatory Visit: Payer: 59 | Admitting: Family Medicine

## 2018-05-07 VITALS — BP 110/62 | HR 76 | Temp 98.6°F | Ht 68.0 in | Wt 177.1 lb

## 2018-05-07 DIAGNOSIS — E034 Atrophy of thyroid (acquired): Secondary | ICD-10-CM

## 2018-05-07 DIAGNOSIS — E119 Type 2 diabetes mellitus without complications: Secondary | ICD-10-CM | POA: Diagnosis not present

## 2018-05-07 DIAGNOSIS — Z9884 Bariatric surgery status: Secondary | ICD-10-CM | POA: Insufficient documentation

## 2018-05-07 DIAGNOSIS — I10 Essential (primary) hypertension: Secondary | ICD-10-CM

## 2018-05-07 DIAGNOSIS — E7800 Pure hypercholesterolemia, unspecified: Secondary | ICD-10-CM | POA: Diagnosis not present

## 2018-05-07 DIAGNOSIS — Z23 Encounter for immunization: Secondary | ICD-10-CM

## 2018-05-07 DIAGNOSIS — Z794 Long term (current) use of insulin: Secondary | ICD-10-CM

## 2018-05-07 MED ORDER — MEASLES, MUMPS & RUBELLA VAC ~~LOC~~ INJ: 0.5000 mL | INJECTION | Freq: Once | SUBCUTANEOUS | Status: DC

## 2018-05-07 NOTE — Assessment & Plan Note (Signed)
Recently had lipids checked; working on weight loss

## 2018-05-07 NOTE — Assessment & Plan Note (Signed)
She has been turned loose by endo; last A1c 5.7; foot exam by MD; pneumonia vacc UTD

## 2018-05-07 NOTE — Progress Notes (Signed)
BP 110/62   Pulse 76   Temp 98.6 F (37 C)   Ht 5' 8"  (1.727 m)   Wt 177 lb 1.6 oz (80.3 kg)   LMP 05/05/2018   SpO2 99%   BMI 26.93 kg/m    Subjective:    Patient ID: Amber Stanley, female    DOB: 02-07-1971, 47 y.o.   MRN: [id number]  HPI: Amber Stanley is a 47 y.o. female  Chief Complaint  Patient presents with  . Follow-up    HPI She is here for diabetes follow-up; last A1c was 5.9 August 7th; she had it done at [place]; was seeing endocrinologist but wishes to f/u here going forward; no problems with feet  She had her MMR, and she is immune to measles, but not immune to rubella or mumps; they told her to talk to her primary; urine microalbumin:Cr was normal in August  Weight loss journey continues; goes back to see bariatric surgeon in December for her one year check-up; weight loss is slowing as expected; drinking enough water; still taking vitamins; not doing any regular exercise; hypertension shows excellent control  Hypothyroidism; weight loss is expected Lab Results  Component Value Date   TSH 0.938 04/16/2018   High cholesterol Lab Results  Component Value Date   CHOL 160 04/16/2018   HDL 37 (L) 04/16/2018   LDLCALC 94 04/16/2018   TRIG 143 04/16/2018   CHOLHDL 4.3 04/16/2018     Depression screen PHQ 2/9 05/07/2018 04/26/2018 02/01/2018 01/25/2018 10/19/2017  Decreased Interest 0 0 0 0 0  Down, Depressed, Hopeless 0 0 0 0 0  PHQ - 2 Score 0 0 0 0 0    Relevant past medical, surgical, family and social history reviewed Past Medical History:  Diagnosis Date  . Anemia   . Asthma   . Diabetes mellitus without complication ([place])   . Diverticulosis 08/2016  . GERD (gastroesophageal reflux disease)   . Heart murmur   . Hypercholesterolemia 07/03/2011  . Hypertension   . Hypothyroidism   . Morbid obesity ([place]) 08/26/2015  . Pneumonia 06/2017  . Polycystic ovaries   . Reflux   . Thyroid disease    Past Surgical History:  Procedure Laterality  Date  . BILATERAL CARPAL TUNNEL RELEASE  2007  . CESAREAN SECTION  10/09/2010   FTP after IOL then had a PPH at [facility]  . CHOLECYSTECTOMY  1999  . COLONOSCOPY WITH PROPOFOL N/A 09/05/2016   Procedure: COLONOSCOPY WITH PROPOFOL;  Surgeon: Jonathon Bellows, MD;  Location: [facility] ENDOSCOPY;  Service: Endoscopy;  Laterality: N/A;. Diverticulosis  . DILATION AND CURETTAGE OF UTERUS  08/18/2003   endometrial polyps and hyperplasia  . ESOPHAGOGASTRODUODENOSCOPY (EGD) WITH PROPOFOL N/A 09/05/2016   Procedure: ESOPHAGOGASTRODUODENOSCOPY (EGD) WITH PROPOFOL;  Surgeon: Jonathon Bellows, MD;  Location: [facility] ENDOSCOPY;  Service: Endoscopy;  Laterality: N/A;  . 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 SESMOIDECTOMY Left    fractured sesamoid bone removed from foot   Family History  Problem Relation Age of Onset  . Diabetes Mother   . Hypertension Mother   . Heart attack Mother 63       died from MI  . Heart disease Sister 66  . Cancer Brother 49       leukemia  . Stroke Maternal Grandmother   . Hypertension Maternal Grandmother   . Diabetes Paternal  Grandmother   . Hypertension Paternal Grandmother   . Stroke Paternal Grandmother        mini strokes  . Ulcers Maternal Grandfather   . Lymphoma Cousin   . Hypertension Maternal Uncle   . Kidney failure Maternal Uncle   . Thyroid disease Paternal Aunt   . Multiple sclerosis Paternal Aunt   . COPD Neg Hx   . Breast cancer Neg Hx    Social History   Tobacco Use  . Smoking status: Former Smoker    Packs/day: 1.50    Years: 20.00    Pack years: 30.00    Types: Cigarettes    Last attempt to quit: 09/10/2005    Years since quitting: 12.6  . Smokeless tobacco: Never Used  . Tobacco comment: quit 7 years ago   Substance Use Topics  . Alcohol use: Yes    Comment: rarely  . Drug use: No    Interim medical  history since last visit reviewed. Allergies and medications reviewed  Review of Systems Per HPI unless specifically indicated above     Objective:    BP 110/62   Pulse 76   Temp 98.6 F (37 C)   Ht 5' 8"  (1.727 m)   Wt 177 lb 1.6 oz (80.3 kg)   LMP 05/05/2018   SpO2 99%   BMI 26.93 kg/m   Wt Readings from Last 3 Encounters:  05/07/18 177 lb 1.6 oz (80.3 kg)  04/26/18 178 lb 12.8 oz (81.1 kg)  02/01/18 183 lb 1.6 oz (83.1 kg)    Physical Exam  Constitutional: She appears well-developed and well-nourished. No distress.  HENT:  Head: Normocephalic and atraumatic.  Eyes: EOM are normal. No scleral icterus.  Neck: No thyromegaly present.  Cardiovascular: Normal rate, regular rhythm and normal heart sounds.  No murmur heard. Pulmonary/Chest: Effort normal and breath sounds normal. No respiratory distress. She has no wheezes.  Abdominal: Soft. Bowel sounds are normal. She exhibits no distension.  Musculoskeletal: She exhibits no edema.  Neurological: She is alert.  Skin: Skin is warm and dry. She is not diaphoretic. No pallor.  Psychiatric: She has a normal mood and affect. Her behavior is normal. Judgment and thought content normal.   Diabetic Foot Form - Detailed   Diabetic Foot Exam - detailed Diabetic Foot exam was performed with the following findings:  Yes 05/07/2018  8:25 AM  Visual Foot Exam completed.:  Yes  Pulse Foot Exam completed.:  Yes  Right Dorsalis Pedis:  Present Left Dorsalis Pedis:  Present  Sensory Foot Exam Completed.:  Yes Semmes-Weinstein Monofilament Test R Site 1-Great Toe:  Pos L Site 1-Great Toe:  Pos        Results for orders placed or performed in visit on 02/04/18  HM DIABETES EYE EXAM  Result Value Ref Range   HM Diabetic Eye Exam No Retinopathy No Retinopathy      Assessment & Plan:   Problem List Items Addressed This Visit      Cardiovascular and Mediastinum   Essential hypertension (Chronic)    Excellent control         Endocrine   Type 2 diabetes mellitus ([place]) - Primary (Chronic)    She has been turned loose by endo; last A1c 5.7; foot exam by MD; pneumonia vacc UTD      Relevant Orders   Lipid panel   Hemoglobin A1c   Comprehensive metabolic panel   Hypothyroidism (Chronic)    Just checked TSH, normal; weight loss is intentional  Other   High cholesterol    Recently had lipids checked; working on weight loss      Bariatric surgery status    Continued weight loss is expected, though slowing; she will try to increase activity, build up slowly; f/u with bariatric surgeon in December       Other Visit Diagnoses    Need for prophylactic vaccination with measles-mumps-rubella (MMR) vaccine       Need for influenza vaccination       Relevant Orders   Flu Vaccine QUAD 6+ mos PF IM (Fluarix Quad PF) (Completed)       Follow up plan: Return in about 5 months (around 09/30/2018) for follow-up visit with Dr. Sanda Klein.  An after-visit summary was printed and given to the patient at [facility].  Please see the patient instructions which may contain other information and recommendations beyond what is mentioned above in the assessment and plan.  Meds ordered this encounter  Medications  . DISCONTD: measles, mumps and rubella vaccine (MMR) injection 0.5 mL    Orders Placed This Encounter  Procedures  . Flu Vaccine QUAD 6+ mos PF IM (Fluarix Quad PF)  . MMR vaccine subcutaneous  . Lipid panel  . Hemoglobin A1c  . Comprehensive metabolic panel

## 2018-05-07 NOTE — Assessment & Plan Note (Signed)
Continued weight loss is expected, though slowing; she will try to increase activity, build up slowly; f/u with bariatric surgeon in December

## 2018-05-07 NOTE — Patient Instructions (Addendum)
Try the Scientific 7 minute work-out may be helpful, but modify as needed and start slow and build up Return for a visit after February 10th Check out the information at [employer] entitled "Nutrition for Weight Loss: What You Need to Know about Fad Diets" Try to lose between 1-2 pounds per week by taking in fewer calories and burning off more calories You can succeed by limiting portions, limiting foods dense in calories and fat, becoming more active, and drinking 8 glasses of water a day (64 ounces) Don't skip meals, especially breakfast, as skipping meals may alter your metabolism Do not use over-the-counter weight loss pills or gimmicks that claim rapid weight loss A healthy BMI (or body mass index) is between 18.5 and 24.9 You can calculate your ideal BMI at the [place] website [employer]

## 2018-05-07 NOTE — Assessment & Plan Note (Signed)
Excellent control.   

## 2018-05-07 NOTE — Assessment & Plan Note (Signed)
Just checked TSH, normal; weight loss is intentional

## 2018-05-11 ENCOUNTER — Other Ambulatory Visit: Payer: Self-pay | Admitting: Obstetrics and Gynecology

## 2018-05-15 ENCOUNTER — Encounter: Payer: Self-pay | Admitting: Certified Nurse Midwife

## 2018-05-15 ENCOUNTER — Ambulatory Visit (INDEPENDENT_AMBULATORY_CARE_PROVIDER_SITE_OTHER): Payer: 59 | Admitting: Certified Nurse Midwife

## 2018-05-15 VITALS — BP 100/60 | HR 66 | Ht 68.0 in | Wt 182.0 lb

## 2018-05-15 DIAGNOSIS — Z01419 Encounter for gynecological examination (general) (routine) without abnormal findings: Secondary | ICD-10-CM | POA: Diagnosis not present

## 2018-05-15 DIAGNOSIS — Z124 Encounter for screening for malignant neoplasm of cervix: Secondary | ICD-10-CM | POA: Diagnosis not present

## 2018-05-15 DIAGNOSIS — Z9884 Bariatric surgery status: Secondary | ICD-10-CM | POA: Diagnosis not present

## 2018-05-15 DIAGNOSIS — Z30015 Encounter for initial prescription of vaginal ring hormonal contraceptive: Secondary | ICD-10-CM | POA: Diagnosis not present

## 2018-05-15 NOTE — Progress Notes (Signed)
Gynecology Annual Exam  PCP: Arnetha Courser, MD  Chief Complaint:  Chief Complaint  Patient presents with  . Gynecologic Exam    periods are all over the place    History of Present Illness:Amber Stanley is a 47 year old Caucasian/White female, G1 P1001, who presents for her annual gyn exam.   Her menses are irregular since starting OCPs and her LMP was 05/05/2018. They occur every 2-5 weeks and they last 7 days. 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 She was switched to [facility] 1/20 to try to regulate her menses and is now on pack #4. She bled the week of 9/2 (third week of pills) and 9/15 (first week of pills) and is on the second week of pills now She is still having cramping with her bleeding and before her bleeding. Occasionally takes ibuprofen 400 mgm which usually helps.  She has lost >70# since her surgery. Has been taking her OCP with her levothyroxine and her Metformin. She denies missing pills. The patient's past medical history is notable for a history of DM x 15 years and prior to having her bariatric surgery was on an insulin pump. She has not needed insulin since her surgery and her most recent hemoglobin A1C was 5.9%. She denies any nephropathy, neuropathy or retinopathy from the DM. Her past medical history is also remarkable for asthma, GERD, and hypothyroidism.  Since her last annual GYN exam dated 05/07/2017 , she has had no other significant changes in her health history. She is unsure whether she desires future pregnancies, but was told not to get pregnant for 18 mos after surgery. She is sexually active. She is currently using OCPs for contraception.  Her most recent pap smear was obtained 05/07/2017 and was NIL. Her most recent mammogram was obtained this year 03/26/2018 and was negative ( ordered by PCP ). She had a colonoscopy and EGD 09/05/2016 for anemia work up. Results: diverticulosis  There is no family history of  breast cancer.  There is no family history of ovarian cancer.  The patient does do monthly self breast exams.  The patient does not smoke. Former smoker.  The patient does rarely drink alcohol.  The patient does not use illegal drugs.  The patient does not exercise. The patient does get adequate calcium in her diet and with her bariatric vitamins She had a recent cholesterol screen in 2019 that was normal except for a slightly low HDL.   The patient denies current symptoms of depression.    Review of Systems: Review of Systems  Constitutional: Negative for chills, fever and weight loss.  HENT: Negative for congestion, sinus pain and sore throat.   Eyes: Negative for blurred vision and pain.  Respiratory: Negative for hemoptysis, shortness of breath and wheezing.   Cardiovascular: Negative for chest pain, palpitations and leg swelling.  Gastrointestinal: Negative for abdominal pain, blood in stool, diarrhea, heartburn, nausea and vomiting.  Genitourinary: Negative for dysuria, frequency, hematuria and urgency.       Positive for irregular bleeding  Musculoskeletal: Negative for back pain, joint pain and myalgias.  Skin: Negative for itching and rash.  Neurological: Negative for dizziness, tingling and headaches.  Endo/Heme/Allergies: Negative for environmental allergies and polydipsia. Does not bruise/bleed easily.       Negative for hirsutism   Psychiatric/Behavioral: Negative for depression. The patient is not nervous/anxious and does not have insomnia.     Past Medical  History:  Past Medical History:  Diagnosis Date  . Anemia   . Asthma   . Diabetes mellitus without complication ([place])   . Diverticulosis 08/2016  . GERD (gastroesophageal reflux disease)   . Heart murmur   . Hypercholesterolemia 07/03/2011  . Hypertension   . Hypothyroidism   . Morbid obesity ([place]) 08/26/2015  . Pneumonia 06/2017  . Polycystic ovaries   . Reflux   . Thyroid disease     Past Surgical  History:  Past Surgical History:  Procedure Laterality Date  . BILATERAL CARPAL TUNNEL RELEASE  2007  . CESAREAN SECTION  10/09/2010   FTP after IOL then had a PPH at [facility]  . CHOLECYSTECTOMY  1999  . COLONOSCOPY WITH PROPOFOL N/A 09/05/2016   Procedure: COLONOSCOPY WITH PROPOFOL;  Surgeon: Jonathon Bellows, MD;  Location: [facility] ENDOSCOPY;  Service: Endoscopy;  Laterality: N/A;. Diverticulosis  . DILATION AND CURETTAGE OF UTERUS  08/18/2003   endometrial polyps and hyperplasia  . ESOPHAGOGASTRODUODENOSCOPY (EGD) WITH PROPOFOL N/A 09/05/2016   Procedure: ESOPHAGOGASTRODUODENOSCOPY (EGD) WITH PROPOFOL;  Surgeon: Jonathon Bellows, MD;  Location: [facility] ENDOSCOPY;  Service: Endoscopy;  Laterality: N/A;  . 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 ROUX-EN-Y GASTRIC BYPASS  08/07/2017   Dr. Greer Pickerel  . SESMOIDECTOMY Left    fractured sesamoid bone removed from foot    Family History:  Family History  Problem Relation Age of Onset  . Diabetes Mother   . Hypertension Mother   . Heart attack Mother 73       died from MI  . Heart disease Sister 43  . Cancer Brother 35       leukemia  . Stroke Maternal Grandmother   . Hypertension Maternal Grandmother   . Diabetes Paternal Grandmother   . Hypertension Paternal Grandmother   . Stroke Paternal Grandmother        mini strokes  . Ulcers Maternal Grandfather   . Lymphoma Cousin   . Hypertension Maternal Uncle   . Kidney failure Maternal Uncle   . Thyroid disease Paternal Aunt   . Multiple sclerosis Paternal Aunt   . COPD Neg Hx   . Breast cancer Neg Hx     Social History:  Social History   Socioeconomic History  . Marital status: Married    Spouse name: Grayland Ormond  . Number of children: 1  . Years of education: 63  . Highest education level: Not on file  Occupational History  . Occupation: office  work  Scientific laboratory technician  . Financial resource strain: Not on file  . Food insecurity:    Worry: Not on file    Inability: Not on file  . Transportation needs:    Medical: Not on file    Non-medical: Not on file  Tobacco Use  . Smoking status: Former Smoker    Packs/day: 1.50    Years: 20.00    Pack years: 30.00    Types: Cigarettes    Last attempt to quit: 09/10/2005    Years since quitting: 12.6  . Smokeless tobacco: Never Used  . Tobacco comment: quit 7 years ago   Substance and Sexual Activity  . Alcohol use: Yes    Comment: rarely  . Drug use: No  . Sexual activity: Yes    Partners: Male    Birth control/protection: Rhythm, Pill  Lifestyle  . Physical activity:  Days per week: Not on file    Minutes per session: Not on file  . Stress: Not on file  Relationships  . Social connections:    Talks on phone: Not on file    Gets together: Not on file    Attends religious service: Not on file    Active member of club or organization: Not on file    Attends meetings of clubs or organizations: Not on file    Relationship status: Not on file  . Intimate partner violence:    Fear of current or ex partner: Not on file    Emotionally abused: Not on file    Physically abused: Not on file    Forced sexual activity: Not on file  Other Topics Concern  . Not on file  Social History Narrative  . Not on file    Allergies:  Allergies  Allergen Reactions  . Adhesive [Tape] Itching    Skin peels off     Medications: Current Outpatient Medications on File Prior to Visit  Medication Sig Dispense Refill  . albuterol (PROAIR HFA) 108 (90 Base) MCG/ACT inhaler Inhale 2 puffs into the lungs every 4 (four) hours as needed for wheezing or shortness of breath. 1 Inhaler 0  . calcium carbonate (TUMS - DOSED IN MG ELEMENTAL CALCIUM) 500 MG chewable tablet Chew 1 tablet by mouth 3 (three) times daily as needed for indigestion or heartburn.    . fluticasone (FLONASE) 50 MCG/ACT nasal spray  Place 1 spray into both nostrils daily.     Lenda Kelp FE 1/20 1-20 MG-MCG tablet TAKE 1 TABLET BY MOUTH DAILY 84 tablet 0  . levothyroxine (SYNTHROID, LEVOTHROID) 50 MCG tablet Take 50 mcg by mouth daily before breakfast.    . loratadine (CLARITIN) 10 MG tablet Take 1 tablet (10 mg total) by mouth daily as needed for allergies.    . metFORMIN (GLUCOPHAGE) 1000 MG tablet Take 500 mg by mouth 2 (two) times daily before a meal.    . montelukast (SINGULAIR) 10 MG tablet TAKE 1 TABLET BY MOUTH  DAILY 90 tablet 3  . Multiple Vitamin (MULTIVITAMIN) tablet Take 1 tablet by mouth 2 (two) times daily. (bariatric advantage)    . omeprazole (PRILOSEC) 20 MG capsule Take 20 mg by mouth daily.    . ondansetron (ZOFRAN) 4 MG tablet Take 4 mg by mouth every 8 (eight) hours as needed.     . ONE TOUCH ULTRA TEST test strip      No current facility-administered medications on file prior to visit.    Physical Exam Vitals:BP 100/60   Pulse 66   Ht 5\' 8"  (1.727 m)   Wt 182 lb (82.6 kg)   LMP 05/05/2018   BMI 27.67 kg/m   General: WF in NAD HEENT: normocephalic, anicteric Neck: no thyroid enlargement, no palpable nodules, no cervical lymphadenopathy  Pulmonary: No increased work of breathing, CTAB Cardiovascular: RRR, without murmur  Breast: Breast symmetrical, no tenderness, no palpable nodules or masses, no skin or nipple retraction present, no nipple discharge.  No axillary, infraclavicular or supraclavicular lymphadenopathy. Abdomen: Soft, non-tender, non-distended.  Umbilicus without lesions.  No hepatomegaly or masses palpable. No evidence of hernia. Port scars on abd x 3 from Roux en Y surgury Genitourinary:  External: Normal external female genitalia. Normal urethral meatus, normal Bartholin's and Skene's glands.    Vagina: Normal vaginal mucosa, no evidence of prolapse.    Cervix: Grossly normal in appearance, no bleeding, non-tender  Uterus: Anteverted and deviated  to left, normal size, shape, and  consistency, mobile, and non-tender  Adnexa: No adnexal masses, non-tender  Rectal: deferred  Lymphatic: no evidence of inguinal lymphadenopathy Extremities: no edema, erythema, or tenderness Neurologic: Grossly intact Psychiatric: mood appropriate, affect full     Assessment/ Plan: 47 y.o. G1P1001 annual gyn exam Irregular bleeding on OCPs after bariatric surgery-which may affect the absorption/ effectiveness of the oral contraceptives  Which may cause the irregular bleeding.The CDC [facility] rates the combined oral contraceptives as a 3. The Nuvaring is rated as a 1. For diabetic patients with DM <20 years and no nephropathy, neuropathy or retinopathy CHC is rated as a 2. Also discussed alternative contraception, like the progesterone IUD or sterilization. Patient would like to try Nuvaring. Discussed how to insert and how to use. Patient to do a UPT at home and if negative, start Nuvaring on Sunday. Stop pills on Sat. Use condoms until on Nuvaring 1 week. Aware of risks of thromboembolism. GIven samples to try. If irregular bleeding persists-consider ultrasound and possible endometrial biopsy.:    1) Breast cancer screening - recommend monthly self breast exam and annual screening mammograms. Mammogram UTD  2) Colon cancer screening-done 08/2016  3) Cervical cancer screening - Pap was done. ASCCP guidelines and rational discussed.  Patient opts for yearly screening interval  4) Contraception - Nuvaring  5) Routine healthcare maintenance including cholesterol  screening managed by PCP   6) RTO in 2-3 months for followup.  Dalia Heading, CNM

## 2018-05-18 LAB — IGP, APTIMA HPV
HPV Aptima: NEGATIVE
PAP Smear Comment: 0

## 2018-05-31 IMAGING — CR DG CHEST 2V
1 series · 2 of 2 positions shown · non-contrast
Comparison: 06/04/2017

CLINICAL DATA: Pneumonia 4 weeks ago, followup, continued RIGHT
lateral side pain, history asthma, former smoker, hypertension,
diabetes mellitus

EXAM:
CHEST  2 VIEW

[Series 1: dg chest 2 view · 0.14mm/px · 2 of 2 slices shown]
[im 1/2]
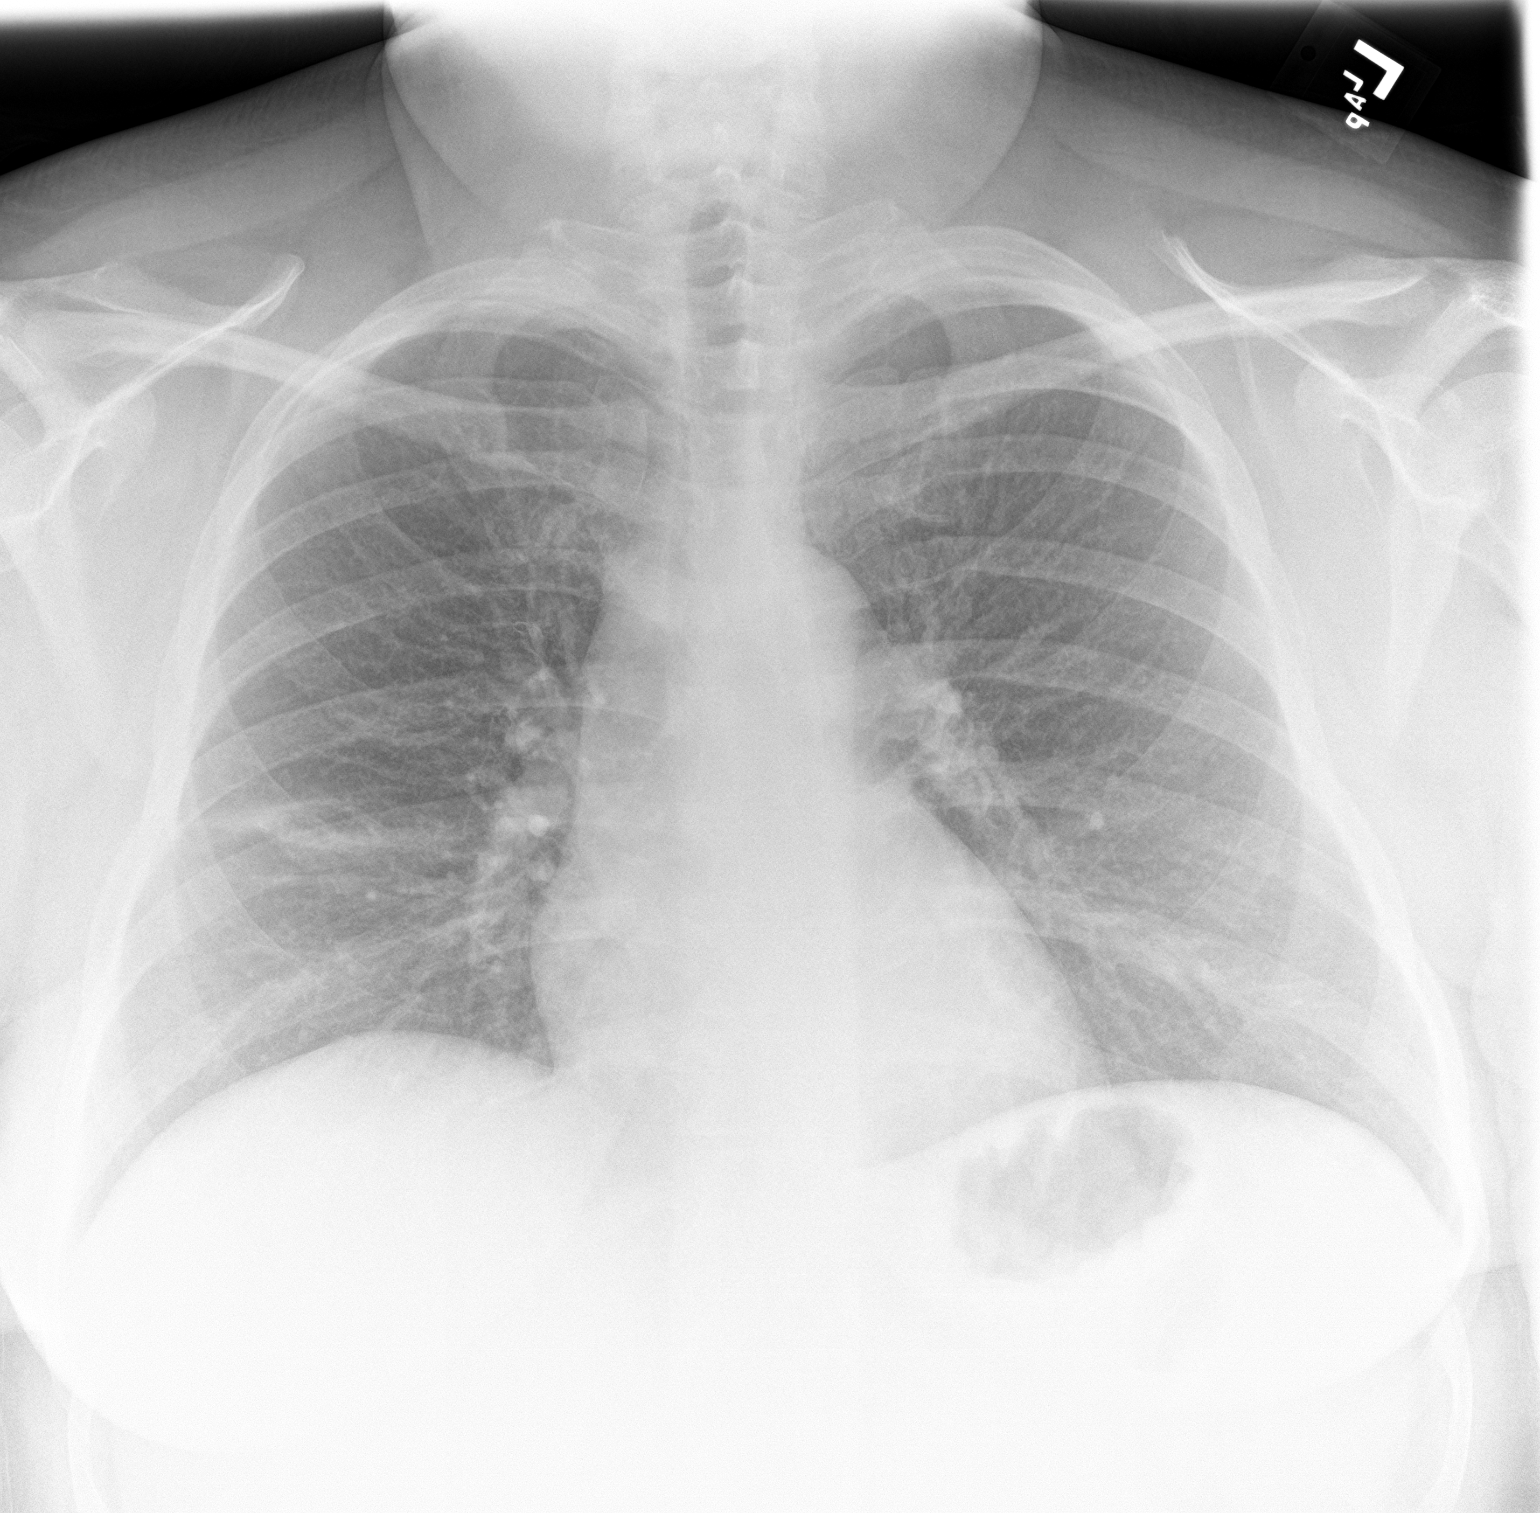
[im 2/2]
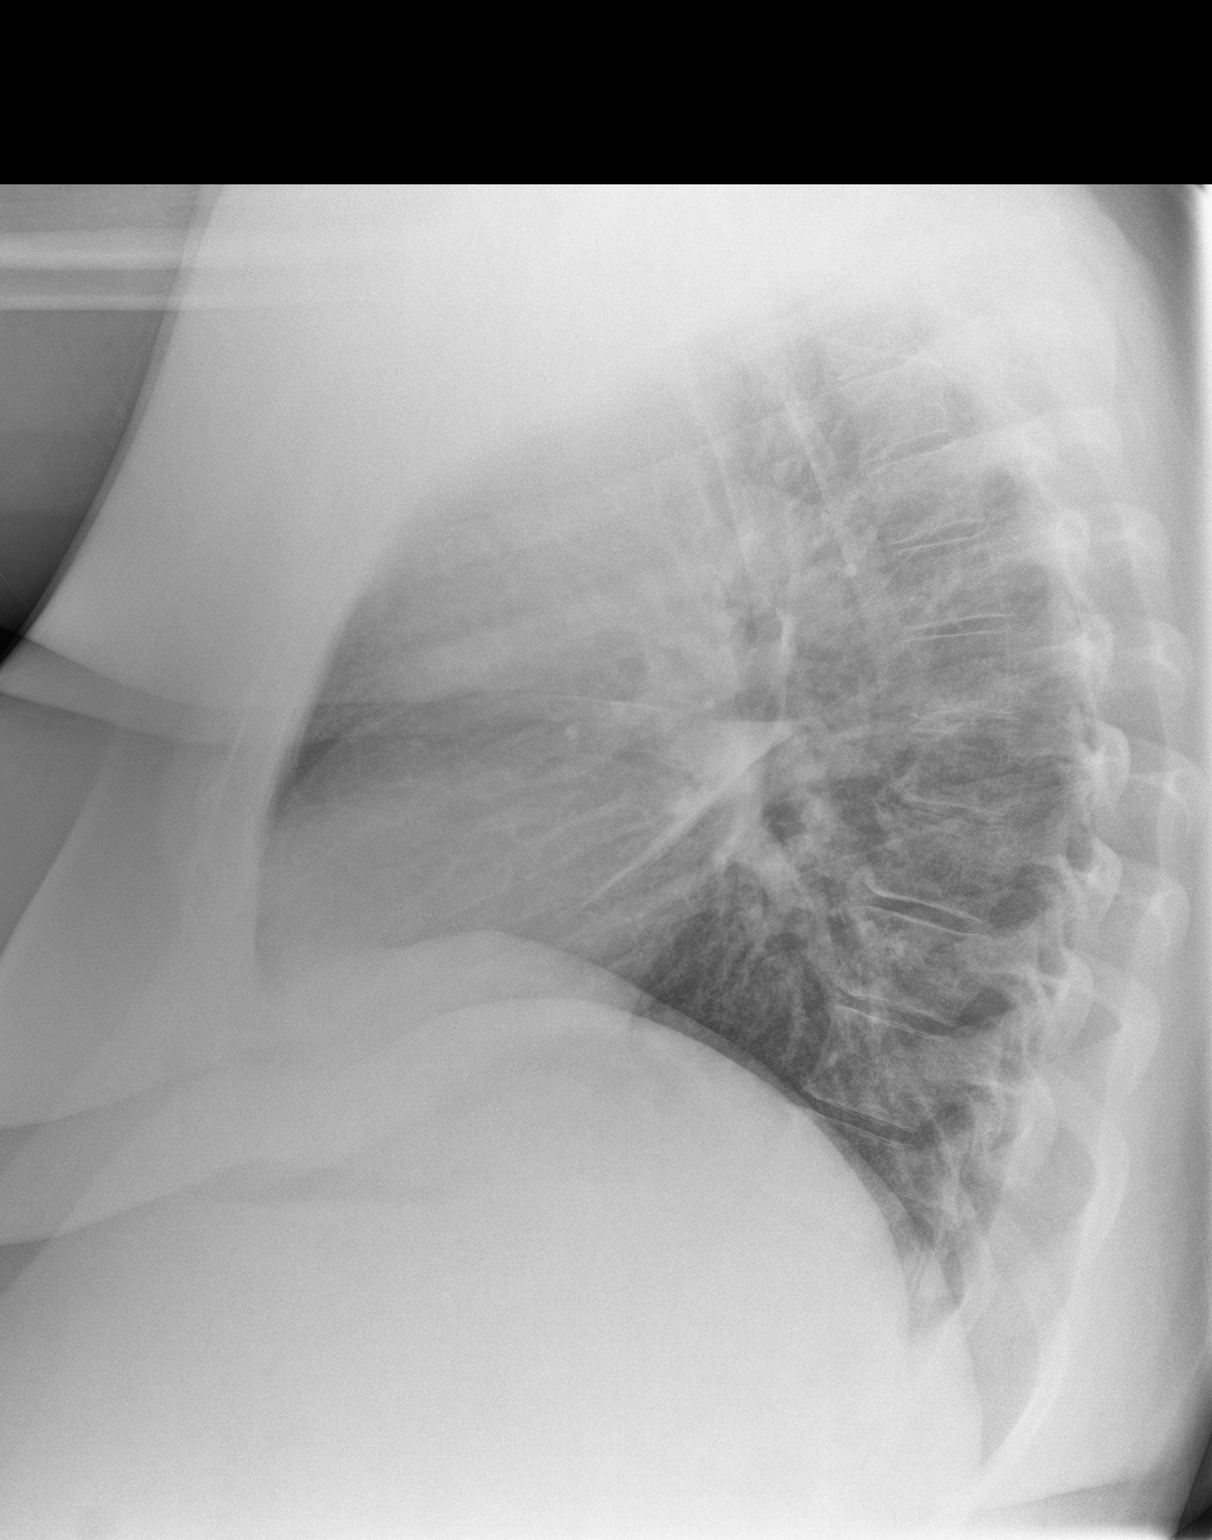

[2 of 2 positions shown; findings below may reference images not displayed]

FINDINGS: Normal heart size, mediastinal contours, and pulmonary vascularity.

Improved RIGHT middle lobe infiltrate with mild residual linear
subsegmental atelectasis.

Remaining lungs clear.

No new areas of consolidation, pleural effusion or pneumothorax.

Bones unremarkable.
IMPRESSION: Improved RIGHT middle lobe infiltrate.

Linear subsegmental atelectasis at RIGHT middle lobe.

## 2018-07-03 DIAGNOSIS — M25512 Pain in left shoulder: Secondary | ICD-10-CM | POA: Diagnosis not present

## 2018-07-08 ENCOUNTER — Encounter: Payer: Self-pay | Admitting: Nurse Practitioner

## 2018-07-08 ENCOUNTER — Ambulatory Visit: Payer: 59 | Admitting: Nurse Practitioner

## 2018-07-08 VITALS — BP 96/64 | HR 65 | Temp 97.4°F | Resp 16 | Ht 68.0 in | Wt 183.3 lb

## 2018-07-08 DIAGNOSIS — R300 Dysuria: Secondary | ICD-10-CM

## 2018-07-08 DIAGNOSIS — R311 Benign essential microscopic hematuria: Secondary | ICD-10-CM

## 2018-07-08 LAB — POCT URINALYSIS DIPSTICK
APPEARANCE: NORMAL
BILIRUBIN UA: NEGATIVE
GLUCOSE UA: NEGATIVE
Ketones, UA: NEGATIVE
Leukocytes, UA: NEGATIVE
Nitrite, UA: NEGATIVE
Odor: NORMAL
Protein, UA: NEGATIVE
RBC UA: POSITIVE
Spec Grav, UA: 1.01 (ref 1.010–1.025)
Urobilinogen, UA: 0.2 E.U./dL
pH, UA: 7 (ref 5.0–8.0)

## 2018-07-08 NOTE — Progress Notes (Signed)
Name: Amber Stanley   MRN: [id number]    DOB: [birth year]   Date:07/08/2018       Progress Note  Subjective  Chief Complaint  Chief Complaint  Patient presents with  . Urinary Frequency  . Dysuria    HPI   UTI Symptoms Symptoms have been ongoing for 3 days.  Patient endorses dysuria, urinary frequency and urgency, suprapubic pain, denies hematuria- is on period notes scant vaginal bleeding.  Patient denies feverrs, chills, flank pain, CVA tenderness, n/v, fatigue. Patient denies  itching or pain.  Patient has not tried anything.  Patient does not have a history of  Frequent UTIs.  States has been drinking lots of water and symptoms have improved significantly today. Does not some lower abdominal cramping started yesterday, similar to period cramps which started yesterday.  Patient Active Problem List   Diagnosis Date Noted  . Bariatric surgery status 05/07/2018  . Overweight (BMI 25.0-29.9) 02/01/2018  . Essential hypertension 08/07/2017  . Bilateral chronic knee pain 08/07/2017  . First degree hemorrhoids   . Diverticulosis of large intestine without diverticulitis   . Iron deficiency anemia 08/15/2016  . Hx of microcytic hypochromic anemia 08/10/2016  . Preventative health care 01/19/2016  . Hypothyroidism 07/03/2011  . High cholesterol 07/03/2011  . Type 2 diabetes mellitus ([place]) 07/03/2011  . GERD (gastroesophageal reflux disease) 07/03/2011  . Asthma 07/03/2011  . PCOS (polycystic ovarian syndrome) 07/03/2011    Past Medical History:  Diagnosis Date  . Anemia   . Asthma   . Diabetes mellitus without complication ([place])   . Diverticulosis 08/2016  . GERD (gastroesophageal reflux disease)   . Heart murmur   . Hypercholesterolemia 07/03/2011  . Hypertension   . Hypothyroidism   . Morbid obesity ([place]) 08/26/2015  . Pneumonia 06/2017  . Polycystic ovaries   . Reflux   . Thyroid disease     Past Surgical History:  Procedure Laterality Date  . BILATERAL  CARPAL TUNNEL RELEASE  2007  . CESAREAN SECTION  10/09/2010   FTP after IOL then had a P[facility] at [facility]  . CHOLECYSTECTOMY  1999  . COLONOSCOPY WITH PROPOFOL N/A 09/05/2016   Procedure: COLONOSCOPY WITH PROPOFOL;  Surgeon: Jonathon Bellows, MD;  Location: [facility] ENDOSCOPY;  Service: Endoscopy;  Laterality: N/A;. Diverticulosis  . DILATION AND CURETTAGE OF UTERUS  08/18/2003   endometrial polyps and hyperplasia  . ESOPHAGOGASTRODUODENOSCOPY (EGD) WITH PROPOFOL N/A 09/05/2016   Procedure: ESOPHAGOGASTRODUODENOSCOPY (EGD) WITH PROPOFOL;  Surgeon: Jonathon Bellows, MD;  Location: [facility] ENDOSCOPY;  Service: Endoscopy;  Laterality: N/A;  . 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 ROUX-EN-Y GASTRIC BYPASS  08/07/2017   Dr. Greer Pickerel  . SESMOIDECTOMY Left    fractured sesamoid bone removed from foot    Social History   Tobacco Use  . Smoking status: Former Smoker    Packs/day: 1.50    Years: 20.00    Pack years: 30.00    Types: Cigarettes    Last attempt to quit: 09/10/2005    Years since quitting: 12.8  . Smokeless tobacco: Never Used  . Tobacco comment: quit 12 years ago   Substance Use Topics  . Alcohol use: Yes    Comment: rarely     Current Outpatient Medications:  .  albuterol (PROAIR HFA) 108 (90 Base) MCG/ACT inhaler, Inhale 2 puffs into the lungs every 4 (four) hours  as needed for wheezing or shortness of breath., Disp: 1 Inhaler, Rfl: 0 .  calcium carbonate (TUMS - DOSED IN MG ELEMENTAL CALCIUM) 500 MG chewable tablet, Chew 1 tablet by mouth 3 (three) times daily as needed for indigestion or heartburn., Disp: , Rfl:  .  fluticasone (FLONASE) 50 MCG/ACT nasal spray, Place 1 spray into both nostrils daily. , Disp: , Rfl:  .  levothyroxine (SYNTHROID, LEVOTHROID) 50 MCG tablet, Take 50 mcg by mouth daily before breakfast., Disp: , Rfl:  .   loratadine (CLARITIN) 10 MG tablet, Take 1 tablet (10 mg total) by mouth daily as needed for allergies., Disp: , Rfl:  .  metFORMIN (GLUCOPHAGE) 1000 MG tablet, Take 500 mg by mouth 2 (two) times daily before a meal., Disp: , Rfl:  .  montelukast (SINGULAIR) 10 MG tablet, TAKE 1 TABLET BY MOUTH  DAILY, Disp: 90 tablet, Rfl: 3 .  Multiple Vitamin (MULTIVITAMIN) tablet, Take 1 tablet by mouth 2 (two) times daily. (bariatric advantage), Disp: , Rfl:  .  omeprazole (PRILOSEC) 20 MG capsule, Take 20 mg by mouth daily., Disp: , Rfl:  .  ONE TOUCH ULTRA TEST test strip, , Disp: , Rfl:  .  ondansetron (ZOFRAN) 4 MG tablet, Take 4 mg by mouth every 8 (eight) hours as needed. , Disp: , Rfl:   Allergies  Allergen Reactions  . Adhesive [Tape] Itching    Skin peels off     ROS   No other specific complaints in a complete review of systems (except as listed in HPI above).  Objective  Vitals:   07/08/18 1537  BP: 96/64  Pulse: 65  Resp: 16  Temp: (!) 97.4 F (36.3 C)  TempSrc: Oral  SpO2: 100%  Weight: 183 lb 4.8 oz (83.1 kg)  Height: 5\' 8"  (1.727 m)     Body mass index is 27.87 kg/m.  Nursing Note and Vital Signs reviewed.  Physical Exam  Constitutional: She is oriented to person, place, and time. She appears well-developed and well-nourished. She is cooperative.  HENT:  Head: Normocephalic and atraumatic.  Right Ear: Hearing normal.  Left Ear: Hearing normal.  Mouth/Throat: Mucous membranes are normal.  Eyes: Conjunctivae are normal.  Cardiovascular: Normal rate, regular rhythm and normal heart sounds.  Pulmonary/Chest: Effort normal and breath sounds normal.  Abdominal: Soft. Normal appearance and bowel sounds are normal. There is no tenderness (mild suprapubic pressure with palpation).  No CVA tenderness   Musculoskeletal: Normal range of motion.  Neurological: She is alert and oriented to person, place, and time.  Psychiatric: She has a normal mood and affect. Her speech  is normal and behavior is normal. Judgment and thought content normal.      No results found for this or any previous visit (from the past 48 hour(s)).  Assessment & Plan 1. Dysuria No leuks, blood seen- likely from period, will complete microscopic to ensure no fluids, drink plenty of water, AZO 2 days; if symptoms not resolved will recheck; likely self resolved UTI - POCT Urinalysis Dipstick  2. Benign essential microscopic hematuria - Urinalysis, Routine w reflex microscopic

## 2018-07-08 NOTE — Patient Instructions (Addendum)
-   POC urine negative for infection will send it to lab for microscopic testing and call you with results. In the mean time drink plenty of water at least 70 ounces a day and can take AZO over the counter for the next 2 days.    ____________  If you are having pain when you pee, you can also take a medicine to numb your bladder. Look for Phenazopyridine (Pyridium or AZO) over the counter at your pharmacy. This medicine eases the pain caused by urinary tract infections. It also reduces the need to urinate.  If you frequently get UTI's here are some prevention tips:  - Avoiding spermicides  - Drinking more fluid - This can help prevent bladder infections. ?Urinating right after sex - Some doctors think this helps, because it helps flush out germs that might get into the bladder during sex. There is no proof it works, but it also cannot hurt. ?Vaginal estrogen - If you are a woman who has already been through menopause, your doctor might suggest this. Vaginal estrogen comes in a cream or a flexible ring that you put into your vagina. It can help prevent bladder infections. ?Antibiotics - If you get a lot of bladder infections, and the above methods have not helped, your doctor might give you antibiotics to help prevent infection. But taking antibiotics has downsides, so doctors usually suggest trying other things first   The studies suggesting that cranberry products prevent bladder infections are not very good. Other studies suggest that cranberry products do not prevent bladder infections. But if you want to try cranberry products for this purpose, there is probably not much harm in doing so.

## 2018-07-09 ENCOUNTER — Other Ambulatory Visit: Payer: Self-pay | Admitting: Nurse Practitioner

## 2018-07-09 ENCOUNTER — Encounter: Payer: Self-pay | Admitting: Nurse Practitioner

## 2018-07-09 DIAGNOSIS — N3090 Cystitis, unspecified without hematuria: Secondary | ICD-10-CM

## 2018-07-09 LAB — URINALYSIS, ROUTINE W REFLEX MICROSCOPIC
BACTERIA UA: NONE SEEN /HPF
Bilirubin Urine: NEGATIVE
GLUCOSE, UA: NEGATIVE
Hyaline Cast: NONE SEEN /LPF
KETONES UR: NEGATIVE
Nitrite: NEGATIVE
PH: 7.5 (ref 5.0–8.0)
Protein, ur: NEGATIVE
RBC / HPF: NONE SEEN /HPF (ref 0–2)
Specific Gravity, Urine: 1.006 (ref 1.001–1.03)
Squamous Epithelial / LPF: NONE SEEN /HPF (ref <=5)

## 2018-07-09 MED ORDER — SULFAMETHOXAZOLE-TRIMETHOPRIM 800-160 MG PO TABS: 1.0000 | ORAL_TABLET | Freq: Two times a day (BID) | ORAL | 0 refills | Status: DC

## 2018-08-06 ENCOUNTER — Encounter: Payer: Self-pay | Admitting: Certified Nurse Midwife

## 2018-08-06 ENCOUNTER — Other Ambulatory Visit: Payer: Self-pay | Admitting: Certified Nurse Midwife

## 2018-08-06 ENCOUNTER — Encounter: Payer: 59 | Attending: General Surgery | Admitting: Skilled Nursing Facility1

## 2018-08-06 ENCOUNTER — Ambulatory Visit (INDEPENDENT_AMBULATORY_CARE_PROVIDER_SITE_OTHER): Payer: 59 | Admitting: Certified Nurse Midwife

## 2018-08-06 VITALS — BP 114/70 | HR 80 | Ht 68.0 in | Wt 181.0 lb

## 2018-08-06 DIAGNOSIS — N921 Excessive and frequent menstruation with irregular cycle: Secondary | ICD-10-CM

## 2018-08-06 DIAGNOSIS — N926 Irregular menstruation, unspecified: Secondary | ICD-10-CM

## 2018-08-06 DIAGNOSIS — Z975 Presence of (intrauterine) contraceptive device: Secondary | ICD-10-CM | POA: Diagnosis not present

## 2018-08-06 DIAGNOSIS — E669 Obesity, unspecified: Secondary | ICD-10-CM | POA: Diagnosis present

## 2018-08-06 LAB — POCT URINE PREGNANCY: Preg Test, Ur: NEGATIVE

## 2018-08-06 MED ORDER — ESTRADIOL 0.1 MG/24HR TD PTWK: 0.1000 mg | MEDICATED_PATCH | TRANSDERMAL | 0 refills | Status: DC

## 2018-08-06 MED ORDER — ETONOGESTREL-ETHINYL ESTRADIOL 0.12-0.015 MG/24HR VA RING: VAGINAL_RING | VAGINAL | 11 refills | Status: DC

## 2018-08-06 NOTE — Progress Notes (Signed)
History of Present Illness:  Amber Stanley is a 47 y.o. who was started on Nuvaring  Current Outpatient Medications on File Prior to Visit  Medication Sig Dispense Refill  . albuterol (PROAIR HFA) 108 (90 Base) MCG/ACT inhaler Inhale 2 puffs into the lungs every 4 (four) hours as needed for wheezing or shortness of breath. 1 Inhaler 0  . calcium carbonate (TUMS - DOSED IN MG ELEMENTAL CALCIUM) 500 MG chewable tablet Chew 1 tablet by mouth 3 (three) times daily as needed for indigestion or heartburn.    . fluticasone (FLONASE) 50 MCG/ACT nasal spray Place 1 spray into both nostrils daily.     Marland Kitchen levothyroxine (SYNTHROID, LEVOTHROID) 50 MCG tablet Take 50 mcg by mouth daily before breakfast.    . loratadine (CLARITIN) 10 MG tablet Take 1 tablet (10 mg total) by mouth daily as needed for allergies.    . metFORMIN (GLUCOPHAGE) 1000 MG tablet Take 500 mg by mouth 2 (two) times daily before a meal.    . montelukast (SINGULAIR) 10 MG tablet TAKE 1 TABLET BY MOUTH  DAILY 90 tablet 3  . Multiple Vitamin (MULTIVITAMIN) tablet Take 1 tablet by mouth 2 (two) times daily. (bariatric advantage)    . omeprazole (PRILOSEC) 20 MG capsule Take 20 mg by mouth daily.    . ondansetron (ZOFRAN) 4 MG tablet Take 4 mg by mouth every 8 (eight) hours as needed.     . ONE TOUCH ULTRA TEST test strip     . sulfamethoxazole-trimethoprim (BACTRIM DS,SEPTRA DS) 800-160 MG tablet Take 1 tablet by mouth 2 (two) times daily. 6 tablet 0   No current facility-administered medications on file prior to visit.    approximately 3 months ago. She reports that the first month she had late breakthrough bleeding that extended into her menstrual week and lasted 2 weeks. On the second ring she had no BTB and had a normal 4 day flow. On the third ring she again had late cycle BTB and continue to bleed after she removed her ring. She was switched to the ring from oral contraceptives because of BTB ever since having her Roux En Y procedure in  Dec 2018. She has a history of having endometrial polyps in 2004 and had a D&C at that time. She is concerned that she may have polyps causing her BTB.  PMHx: She  has a past medical history of Anemia, Asthma, Diabetes mellitus without complication ([place]), Diverticulosis (08/2016), GERD (gastroesophageal reflux disease), Heart murmur, Hypercholesterolemia (07/03/2011), Hypertension, Hypothyroidism, Morbid obesity ([place]) (08/26/2015), Pneumonia (06/2017), Polycystic ovaries, Reflux, and Thyroid disease. Also,  has a past surgical history that includes Cesarean section (10/09/2010); Cholecystectomy (1999); Bilateral carpal tunnel release (2007); Sesmoidectomy (Left); Dilation and curettage of uterus (08/18/2003); Esophagogastroduodenoscopy (egd) with propofol (N/A, 09/05/2016); Colonoscopy with propofol (N/A, 09/05/2016); Gastric Roux-En-Y (N/A, 08/07/2017); and Roux-en-Y Gastric Bypass (08/07/2017)., family history includes Cancer (age of onset: 27) in her brother; Diabetes in her mother and paternal grandmother; Heart attack (age of onset: 28) in her mother; Heart disease (age of onset: 53) in her sister; Hypertension in her maternal grandmother, maternal uncle, mother, and paternal grandmother; Kidney failure in her maternal uncle; Lymphoma in her cousin; Multiple sclerosis in her paternal aunt; Stroke in her maternal grandmother and paternal grandmother; Thyroid disease in her paternal aunt; Ulcers in her maternal grandfather.,  reports that she quit smoking about 12 years ago. Her smoking use included cigarettes. She has a 30.00 pack-year smoking history. She has never used smokeless tobacco.  She reports current alcohol use. She reports that she does not use drugs.  She has a current medication list which includes the following prescription(s): albuterol, calcium carbonate, fluticasone, levothyroxine, loratadine, metformin, montelukast, multivitamin, omeprazole, ondansetron, one touch ultra test, and  sulfamethoxazole-trimethoprim. Also, is allergic to adhesive [tape].  ROS  Physical Exam:  Vital Signs: BP 114/70   Pulse 80   Ht 5\' 8"  (1.727 m)   Wt 181 lb (82.1 kg)   LMP 08/04/2018 (Approximate)   BMI 27.52 kg/m    Constitutional: Well nourished, well developed female in no acute distress.   Neuro: Grossly intact Psych:  Normal mood and affect.   Results for orders placed or performed in visit on 08/06/18 (from the past 24 hour(s))  POCT urine pregnancy     Status: Normal   Collection Time: 08/06/18  8:29 AM  Result Value Ref Range   Preg Test, Ur Negative Negative   Assessment: Break through bleeding on Nuvaring may be due to an atrophic endometrium, but can get a pelvic ultrasound to rule out a thickened endometrium.  Plan: RX for Nuvaring #1/ RF x 11 and add estradiol patch 0.1 weekly x 3 weeks when inserting next Nuvaring DIscussed how to apply patch. She was amenable to this plan and will see her back in one month for a pelvic ultrasound and follow up.   Dalia Heading, CNM [facility], [facility] 08/06/2018  8:02 AM

## 2018-08-09 ENCOUNTER — Encounter: Payer: Self-pay | Admitting: Skilled Nursing Facility1

## 2018-08-09 NOTE — Progress Notes (Signed)
Bariatric Class:  Appt start time: 6:00 end time: 7:00  12 Month Post-Operative Nutrition Class  Patient was seen on 08/09/2018 for Post-Operative Nutrition education at [facility].   Surgery date: 08/07/2017 Surgery type: RYGB Start weight at [facility]: 256.3 Weight today: 180.2  TANITA  BODY COMP RESULTS  08/09/2018   BMI (kg/m^2) 27.4   Fat Mass (lbs) 61   Fat Free Mass (lbs) 119.2   Total Body Water (lbs) 84.8   The following the learning objectives were met by the patient during this course:  Review of TANITA scale information  Share and discuss bariatric surgery successes and non-scale victories  Identifies Phase VII (Maintenance Phase) Dietary Goals which will be lifelong  Identifies appropriate sources of fluids, proteins, non-starchy vegetables, and complex carbohydrates  Identifies well-balanced meals  Identifies portion control   Identifies appropriate multivitamin and calcium sources post-operatively  Describes the need for physical activity post-operatively and will follow MD recommendations  Identifies and describes SMART goals   Creates at least 2 SMART goals to begin immediately  States when to call healthcare provider regarding medication questions or post-operative complications  Handouts given during class include:  Phase VII: Maintenance Phase-Lifelong  Pt set Goals: -I will take the stairs whenever available by 09/20/2018 -I will say 2 nice things   Follow-Up Plan: Patient will follow-up at [facility] for on-going post-op nutrition visits.

## 2018-08-12 DIAGNOSIS — Z1283 Encounter for screening for malignant neoplasm of skin: Secondary | ICD-10-CM | POA: Diagnosis not present

## 2018-08-12 DIAGNOSIS — D2261 Melanocytic nevi of right upper limb, including shoulder: Secondary | ICD-10-CM | POA: Diagnosis not present

## 2018-08-12 DIAGNOSIS — D225 Melanocytic nevi of trunk: Secondary | ICD-10-CM | POA: Diagnosis not present

## 2018-08-19 ENCOUNTER — Other Ambulatory Visit: Payer: Self-pay | Admitting: Family Medicine

## 2018-08-19 MED ORDER — METFORMIN HCL 1000 MG PO TABS: 500.0000 mg | ORAL_TABLET | Freq: Two times a day (BID) | ORAL | 1 refills | Status: DC

## 2018-08-19 NOTE — Telephone Encounter (Signed)
Copied from [place] [id number]. Topic: Quick Communication - Rx Refill/Question >> Aug 19, 2018 10:23 AM Bea Graff, NT wrote: Medication: metFORMIN (GLUCOPHAGE) 1000 MG tablet  Pt no longer seeing endocrinologist   Has the patient contacted their pharmacy? Yes.   (Agent: If no, request that the patient contact the pharmacy for the refill.) (Agent: If yes, when and what did the pharmacy advise?)  Preferred Pharmacy (with phone number or street name): [id number], [place] [phone] (Phone) [phone] (Fax)    Agent: Please be advised that RX refills may take up to 3 business days. We ask that you follow-up with your pharmacy.

## 2018-08-19 NOTE — Telephone Encounter (Signed)
Lab Results  Component Value Date   CREATININE 0.54 08/08/2017

## 2018-08-26 ENCOUNTER — Encounter: Payer: Self-pay | Admitting: Family Medicine

## 2018-08-26 ENCOUNTER — Other Ambulatory Visit: Payer: Self-pay | Admitting: Family Medicine

## 2018-08-26 DIAGNOSIS — E034 Atrophy of thyroid (acquired): Secondary | ICD-10-CM

## 2018-08-26 MED ORDER — LEVOTHYROXINE SODIUM 50 MCG PO TABS: 50.0000 ug | ORAL_TABLET | Freq: Every day | ORAL | 0 refills | Status: DC

## 2018-08-26 NOTE — Telephone Encounter (Signed)
Amber Stanley, please contact pharmacy to confirm dose of thyroid medicine We have 50 mcg listed Patient is stating 200 mcg That is a HUGE discrepancy, so I need to know exactly what she has been filling Please ORDER TSH to be done with the other labs that are still pending from September; she'll come in this week Back to me for one week supply of correct thyroid med dose Thank you

## 2018-08-26 NOTE — Telephone Encounter (Signed)
90 day supply requested Denied Needs labs

## 2018-08-26 NOTE — Addendum Note (Signed)
Addended by: Hanako Tipping, Satira Anis on: 08/26/2018 12:01 PM   Modules accepted: Orders

## 2018-09-02 ENCOUNTER — Other Ambulatory Visit: Payer: Self-pay

## 2018-09-02 DIAGNOSIS — E034 Atrophy of thyroid (acquired): Secondary | ICD-10-CM

## 2018-09-04 ENCOUNTER — Other Ambulatory Visit: Payer: Self-pay | Admitting: Certified Nurse Midwife

## 2018-09-04 ENCOUNTER — Telehealth: Payer: Self-pay

## 2018-09-04 MED ORDER — ESTRADIOL 0.1 MG/24HR TD PTWK: MEDICATED_PATCH | TRANSDERMAL | 0 refills | Status: DC

## 2018-09-04 NOTE — Telephone Encounter (Signed)
Pt cancelled u/s for this Friday b/c she is on her cycle.  She needs refill of the patch and may need refill on nuvaring, not sure about nuvaring.  She wants to try these for a couple more months to see if she has BTB.  So far she hasn't had any BTB.  If she does have BTB she will reschedule u/s.  [phone]

## 2018-09-04 NOTE — Telephone Encounter (Signed)
Called patient. Will have her use estrogen patches weekly x 2 weeks when inserting the next two Nuvarings. Refills of patches sent in. Already have refills on the Nuvaring.

## 2018-09-05 ENCOUNTER — Other Ambulatory Visit: Payer: Self-pay | Admitting: Certified Nurse Midwife

## 2018-09-05 LAB — HEMOGLOBIN A1C
ESTIMATED AVERAGE GLUCOSE: 128 mg/dL
HEMOGLOBIN A1C: 6.1 % — AB (ref 4.8–5.6)

## 2018-09-05 LAB — COMPREHENSIVE METABOLIC PANEL
A/G RATIO: 1.7 (ref 1.2–2.2)
ALK PHOS: 39 IU/L (ref 39–117)
ALT: 13 IU/L (ref 0–32)
AST: 11 IU/L (ref 0–40)
Albumin: 4.1 g/dL (ref 3.5–5.5)
BUN/Creatinine Ratio: 16 (ref 9–23)
BUN: 10 mg/dL (ref 6–24)
Bilirubin Total: <0.2 mg/dL (ref 0.0–1.2)
CALCIUM: 9.4 mg/dL (ref 8.7–10.2)
CO2: 22 mmol/L (ref 20–29)
Chloride: 104 mmol/L (ref 96–106)
Creatinine, Ser: 0.64 mg/dL (ref 0.57–1.00)
GFR calc Af Amer: 123 mL/min/{1.73_m2} (ref >59)
GFR, EST NON AFRICAN AMERICAN: 107 mL/min/{1.73_m2} (ref >59)
Globulin, Total: 2.4 g/dL (ref 1.5–4.5)
Glucose: 99 mg/dL (ref 65–99)
POTASSIUM: 5.1 mmol/L (ref 3.5–5.2)
SODIUM: 141 mmol/L (ref 134–144)
TOTAL PROTEIN: 6.5 g/dL (ref 6.0–8.5)

## 2018-09-05 LAB — LIPID PANEL
CHOLESTEROL TOTAL: 205 mg/dL — AB (ref 100–199)
Chol/HDL Ratio: 4.5 ratio — ABNORMAL HIGH (ref 0.0–4.4)
HDL: 46 mg/dL (ref >39)
LDL Calculated: 121 mg/dL — ABNORMAL HIGH (ref 0–99)
Triglycerides: 190 mg/dL — ABNORMAL HIGH (ref 0–149)
VLDL CHOLESTEROL CAL: 38 mg/dL (ref 5–40)

## 2018-09-05 LAB — TSH: TSH: 1.38 u[IU]/mL (ref 0.450–4.500)

## 2018-09-06 ENCOUNTER — Other Ambulatory Visit: Payer: 59

## 2018-09-06 ENCOUNTER — Ambulatory Visit: Payer: 59 | Admitting: Certified Nurse Midwife

## 2018-09-11 ENCOUNTER — Encounter: Payer: Self-pay | Admitting: Family Medicine

## 2018-09-11 ENCOUNTER — Other Ambulatory Visit: Payer: Self-pay

## 2018-09-11 MED ORDER — LEVOTHYROXINE SODIUM 50 MCG PO TABS: 50.0000 ug | ORAL_TABLET | Freq: Every day | ORAL | 1 refills | Status: DC

## 2018-09-11 NOTE — Telephone Encounter (Signed)
Okay for refills, last TSH reviewed Cue up pharmacy and whether 30 or 90 day, let patient know we'll send

## 2018-09-11 NOTE — Telephone Encounter (Signed)
90 supply requested to [facility] Lab Results  Component Value Date   TSH 1.380 09/04/2018   Approved

## 2018-10-03 ENCOUNTER — Encounter: Payer: Self-pay | Admitting: Nurse Practitioner

## 2018-10-03 ENCOUNTER — Ambulatory Visit: Payer: Managed Care, Other (non HMO) | Admitting: Nurse Practitioner

## 2018-10-03 VITALS — BP 120/80 | HR 98 | Temp 98.0°F | Resp 16 | Ht 68.0 in | Wt 186.3 lb

## 2018-10-03 DIAGNOSIS — Z794 Long term (current) use of insulin: Secondary | ICD-10-CM

## 2018-10-03 DIAGNOSIS — J0100 Acute maxillary sinusitis, unspecified: Secondary | ICD-10-CM

## 2018-10-03 DIAGNOSIS — J4520 Mild intermittent asthma, uncomplicated: Secondary | ICD-10-CM

## 2018-10-03 DIAGNOSIS — E7800 Pure hypercholesterolemia, unspecified: Secondary | ICD-10-CM

## 2018-10-03 DIAGNOSIS — I10 Essential (primary) hypertension: Secondary | ICD-10-CM | POA: Diagnosis not present

## 2018-10-03 DIAGNOSIS — E034 Atrophy of thyroid (acquired): Secondary | ICD-10-CM | POA: Diagnosis not present

## 2018-10-03 DIAGNOSIS — K219 Gastro-esophageal reflux disease without esophagitis: Secondary | ICD-10-CM | POA: Diagnosis not present

## 2018-10-03 DIAGNOSIS — E282 Polycystic ovarian syndrome: Secondary | ICD-10-CM

## 2018-10-03 DIAGNOSIS — E119 Type 2 diabetes mellitus without complications: Secondary | ICD-10-CM

## 2018-10-03 MED ORDER — AMOXICILLIN-POT CLAVULANATE 875-125 MG PO TABS: 1.0000 | ORAL_TABLET | Freq: Two times a day (BID) | ORAL | 0 refills | Status: DC

## 2018-10-03 NOTE — Progress Notes (Addendum)
Name: Amber Stanley   MRN: [id number]    DOB: [birth year]   Date:10/03/2018       Progress Note  Subjective  Chief Complaint  Chief Complaint  Patient presents with  . Diabetes  . Sinus Problem    notice about a week ago. otc: tylenol, sudafed & generic Mucinex    HPI  Patient presents for follow-up for the following chronic conditions in addition to sinus issue:  Patient endorses nasal congestion and sinus pressure endorses mild improvement a few days ago but then sudden worsening- ongoing for over a week. Has tried OTC tylenol, sudafed and musinex with mild relief of symptoms. Mild sore throat and got cough yesterday. Denies shortness of breath, chest pain, fevers.   Hypertension: patient is diet controlled; lost weight and has been able to come off blood pressure medications.   Asthma: patient takes albuterol as needed- not used recently.   GERD: patient takes prilosec 20mg  maybe once a week and tums daily.   Hypothyroidism: patient takes 17mcg of synthroid daily. TSH1/15/2020 WNL- 1.38.  Diabetes 2: patient takes metformin 1000mg  BID. Well controlled- last A1C was 6.1 09/04/2018   Hyperlipidemia: patient is not on statin; has been on statin in the past. Last LDL 09/04/2018 was 121.   PCOS: patient is taking metformin. Estradiol patch and using nuvaring- states sees OBGYN- [facility]  Allergies: takes flonase PRN and zyrtec daily.    Patient Active Problem List   Diagnosis Date Noted  . Bariatric surgery status 05/07/2018  . Overweight (BMI 25.0-29.9) 02/01/2018  . Essential hypertension 08/07/2017  . Bilateral chronic knee pain 08/07/2017  . First degree hemorrhoids   . Diverticulosis of large intestine without diverticulitis   . Iron deficiency anemia 08/15/2016  . Hx of microcytic hypochromic anemia 08/10/2016  . Preventative health care 01/19/2016  . Hypothyroidism 07/03/2011  . High cholesterol 07/03/2011  . Type 2 diabetes mellitus ([place]) 07/03/2011  . GERD  (gastroesophageal reflux disease) 07/03/2011  . Asthma 07/03/2011  . PCOS (polycystic ovarian syndrome) 07/03/2011    Past Medical History:  Diagnosis Date  . Anemia   . Asthma   . Diabetes mellitus without complication ([place])   . Diverticulosis 08/2016  . GERD (gastroesophageal reflux disease)   . Heart murmur   . Hypercholesterolemia 07/03/2011  . Hypertension   . Hypothyroidism   . Morbid obesity ([place]) 08/26/2015  . Pneumonia 06/2017  . Polycystic ovaries   . Reflux   . Thyroid disease     Past Surgical History:  Procedure Laterality Date  . BILATERAL CARPAL TUNNEL RELEASE  2007  . CESAREAN SECTION  10/09/2010   FTP after IOL then had a PPH at [facility]  . CHOLECYSTECTOMY  1999  . COLONOSCOPY WITH PROPOFOL N/A 09/05/2016   Procedure: COLONOSCOPY WITH PROPOFOL;  Surgeon: Jonathon Bellows, MD;  Location: [facility] ENDOSCOPY;  Service: Endoscopy;  Laterality: N/A;. Diverticulosis  . DILATION AND CURETTAGE OF UTERUS  08/18/2003   endometrial polyps and hyperplasia  . ESOPHAGOGASTRODUODENOSCOPY (EGD) WITH PROPOFOL N/A 09/05/2016   Procedure: ESOPHAGOGASTRODUODENOSCOPY (EGD) WITH PROPOFOL;  Surgeon: Jonathon Bellows, MD;  Location: [facility] ENDOSCOPY;  Service: Endoscopy;  Laterality: N/A;  . 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 ROUX-EN-Y GASTRIC BYPASS  08/07/2017   Dr. Greer Pickerel  . SESMOIDECTOMY Left    fractured sesamoid bone removed from foot    Social History  Tobacco Use  . Smoking status: Former Smoker    Packs/day: 1.50    Years: 20.00    Pack years: 30.00    Types: Cigarettes    Last attempt to quit: 09/10/2005    Years since quitting: 13.0  . Smokeless tobacco: Never Used  . Tobacco comment: quit 12 years ago   Substance Use Topics  . Alcohol use: Yes    Comment: rarely     Current Outpatient Medications:  .  albuterol  (PROAIR HFA) 108 (90 Base) MCG/ACT inhaler, Inhale 2 puffs into the lungs every 4 (four) hours as needed for wheezing or shortness of breath., Disp: 1 Inhaler, Rfl: 0 .  calcium carbonate (TUMS - DOSED IN MG ELEMENTAL CALCIUM) 500 MG chewable tablet, Chew 1 tablet by mouth 3 (three) times daily as needed for indigestion or heartburn., Disp: , Rfl:  .  estradiol (CLIMARA - DOSED IN MG/24 HR) 0.1 mg/24hr patch, Apply patch weekly x 2 weeks when inserting the next two Nuvarings., Disp: 4 patch, Rfl: 0 .  etonogestrel-ethinyl estradiol (NUVARING) 0.12-0.015 MG/24HR vaginal ring, Insert vaginally and leave in place for 3 consecutive weeks, then remove for 1 week., Disp: 1 each, Rfl: 11 .  fluticasone (FLONASE) 50 MCG/ACT nasal spray, Place 1 spray into both nostrils daily. , Disp: , Rfl:  .  levothyroxine (SYNTHROID, LEVOTHROID) 50 MCG tablet, Take 1 tablet (50 mcg total) by mouth daily before breakfast., Disp: 90 tablet, Rfl: 1 .  metFORMIN (GLUCOPHAGE) 1000 MG tablet, Take 0.5 tablets (500 mg total) by mouth 2 (two) times daily before a meal., Disp: 30 tablet, Rfl: 1 .  Multiple Vitamin (MULTIVITAMIN) tablet, Take 1 tablet by mouth 2 (two) times daily. (bariatric advantage), Disp: , Rfl:  .  omeprazole (PRILOSEC) 20 MG capsule, Take 20 mg by mouth daily., Disp: , Rfl:  .  ondansetron (ZOFRAN) 4 MG tablet, Take 4 mg by mouth every 8 (eight) hours as needed. , Disp: , Rfl:  .  ONE TOUCH ULTRA TEST test strip, , Disp: , Rfl:   Allergies  Allergen Reactions  . Nsaids Other (See Comments)    Patient had gastric bypass surgery so she cannot tolerate NSAIDs  . Adhesive [Tape] Itching    Skin peels off     ROS   No other specific complaints in a complete review of systems (except as listed in HPI above).  Objective  Vitals:   10/03/18 0840  BP: 120/80  Pulse: 98  Resp: 16  Temp: 98 F (36.7 C)  TempSrc: Oral  SpO2: 97%  Weight: 186 lb 4.8 oz (84.5 kg)  Height: 5\' 8"  (1.727 m)    Body  mass index is 28.33 kg/m.  Nursing Note and Vital Signs reviewed.  Physical Exam HENT:     Head: Normocephalic and atraumatic.     Right Ear: Hearing, tympanic membrane, ear canal and external ear normal.     Left Ear: Hearing, tympanic membrane, ear canal and external ear normal.     Nose: Congestion present.     Right Sinus: No maxillary sinus tenderness or frontal sinus tenderness.     Left Sinus: Maxillary sinus tenderness and frontal sinus tenderness present.     Mouth/Throat:     Mouth: Mucous membranes are moist.     Pharynx: Uvula midline. No oropharyngeal exudate or posterior oropharyngeal erythema.  Eyes:     General:        Right eye: No discharge.  Left eye: No discharge.     Conjunctiva/sclera: Conjunctivae normal.  Neck:     Musculoskeletal: Normal range of motion.  Cardiovascular:     Rate and Rhythm: Normal rate and regular rhythm.     Pulses: Normal pulses.  Pulmonary:     Effort: Pulmonary effort is normal.     Breath sounds: Normal breath sounds.  Abdominal:     General: Abdomen is flat. Bowel sounds are normal.     Tenderness: There is no abdominal tenderness. There is no right CVA tenderness or left CVA tenderness.  Lymphadenopathy:     Cervical: No cervical adenopathy.  Skin:    General: Skin is warm and dry.     Findings: No rash.  Neurological:     General: No focal deficit present.     Mental Status: She is alert and oriented to person, place, and time.  Psychiatric:        Mood and Affect: Mood normal.        Behavior: Behavior normal.        Judgment: Judgment normal.      No results found for this or any previous visit (from the past 48 hour(s)).  Assessment & Plan  1. Essential hypertension Diet controlled   2. Mild intermittent asthma without complication Albuterol PRN  3. Gastroesophageal reflux disease, esophagitis presence not specified prilosec PRN; diet   4. Hypothyroidism due to acquired atrophy of  thyroid Controlled continue synthroid  5. Type 2 diabetes mellitus without complication, with long-term current use of insulin (HCC) Metformin BID, well controlled- monofilament and microalbumin due at follow-up.   6. High cholesterol Discussed statin- declined  7. PCOS (polycystic ovarian syndrome) Continue metformin, plan for hysterectomy with GYN in the future     -Red flags and when to present for emergency care or RTC including fever >101.15F, chest pain, shortness of breath, new/worsening/un-resolving symptoms,  reviewed with patient at time of visit. Follow up and care instructions discussed and provided in AVS.

## 2018-10-03 NOTE — Patient Instructions (Addendum)
Bad cholesterol, also called low-density lipoprotein (LDL), carries cholesterol and other fats that your liver makes to your body tissue. If it builds up in blood vessels, LDL can cause heart disease and other health problems. Your LDL level should be below 100. If you have diabetes or a possible heart problem, your LDL should be below 70.  Eat: Eat 20 to 30 grams of soluble fiber every day. Foods such as fruits and vegetables, whole grains, beans, peas, nuts, and seeds can help lower LDL. Avoid: Saturated fats (Dairy foods - such as butter, cream, ghee, regular-fat milk and cheese. Meat - such as fatty cuts of beef, pork and lamb, processed meats like salami, sausages and the skin on chicken. Lard., fatty snack foods, cakes, biscuits, pies and deep fried foods) Avoid smoking  Please take your antibiotic as prescribed with food on your stomach to prevent nausea and upset stomach. Take the antibiotic for the entire course, even if your symptoms resolve before the course if completed. Using antibiotics inappropriately can make it harder to treat future infections. If you have any concerning side effects please stop the medication and let us know immediately. I do recommend taking a probiotic to replenish your good gut health anytime you take an antibiotic. You can get probiotics in types of yogurt like activia, or other food/drinks such as kimchi, kombucha , sauerkraut, or you can take an over the counter supplement.

## 2018-11-02 LAB — HM DIABETES EYE EXAM

## 2018-11-13 ENCOUNTER — Other Ambulatory Visit: Payer: Self-pay | Admitting: Family Medicine

## 2018-12-30 ENCOUNTER — Encounter: Payer: Self-pay | Admitting: Nurse Practitioner

## 2018-12-30 ENCOUNTER — Other Ambulatory Visit: Payer: Self-pay

## 2018-12-30 ENCOUNTER — Ambulatory Visit (INDEPENDENT_AMBULATORY_CARE_PROVIDER_SITE_OTHER): Payer: Managed Care, Other (non HMO) | Admitting: Nurse Practitioner

## 2018-12-30 VITALS — HR 90 | Resp 16

## 2018-12-30 DIAGNOSIS — R0989 Other specified symptoms and signs involving the circulatory and respiratory systems: Secondary | ICD-10-CM | POA: Diagnosis not present

## 2018-12-30 DIAGNOSIS — J0100 Acute maxillary sinusitis, unspecified: Secondary | ICD-10-CM

## 2018-12-30 DIAGNOSIS — R059 Cough, unspecified: Secondary | ICD-10-CM

## 2018-12-30 DIAGNOSIS — R05 Cough: Secondary | ICD-10-CM | POA: Diagnosis not present

## 2018-12-30 DIAGNOSIS — J4520 Mild intermittent asthma, uncomplicated: Secondary | ICD-10-CM | POA: Diagnosis not present

## 2018-12-30 MED ORDER — PROMETHAZINE-DM 6.25-15 MG/5ML PO SYRP: 2.5000 mL | ORAL_SOLUTION | Freq: Four times a day (QID) | ORAL | 0 refills | Status: DC | PRN

## 2018-12-30 MED ORDER — GUAIFENESIN 100 MG/5ML PO LIQD: 200.0000 mg | Freq: Three times a day (TID) | ORAL | 0 refills | Status: DC | PRN

## 2018-12-30 MED ORDER — AMOXICILLIN-POT CLAVULANATE 250-62.5 MG/5ML PO SUSR: 500.0000 mg | Freq: Three times a day (TID) | ORAL | 0 refills | Status: DC

## 2018-12-30 MED ORDER — FLUTICASONE FUROATE-VILANTEROL 100-25 MCG/INH IN AEPB: 1.0000 | INHALATION_SPRAY | Freq: Every day | RESPIRATORY_TRACT | 1 refills | Status: DC

## 2018-12-30 NOTE — Progress Notes (Signed)
Virtual Visit via Video Note  I connected with Amber Stanley on 12/30/18 at  9:00 AM EDT by a video enabled telemedicine application and verified that I am speaking with the correct person using two identifiers.   Staff discussed the limitations of evaluation and management by telemedicine and the availability of in person appointments. The patient expressed understanding and agreed to proceed.  Patient location: home  My location: work office Other people present: none, daughter- Amber Stanley  HPI  Patient endorses nasal congestion, watery eyes and itchy eyes and ears that started over a week ago after she had been outside for a bit states seems to be worsening and feels it has progressed to her chest. Endorses productive, strong cough- thick sputum. States she does have some wheezing that is relieved by albuterol. States has bilateral sinus tenderness worse on the right, states it started to get better a few days ago and then progressively worsened. States she is using albuterol inhaler every 4 hours. Denies fevers, chills, shortness of breath, chest pain.  Has been taking sudafed and Claritin in the morning and zyrtec at night. Using flonase daily.   PHQ2/9: Depression screen [facility] 2/9 12/30/2018 10/03/2018 05/07/2018 04/26/2018 02/01/2018  Decreased Interest 0 0 0 0 0  Down, Depressed, Hopeless 0 0 0 0 0  PHQ - 2 Score 0 0 0 0 0  Altered sleeping 0 - - - -  Tired, decreased energy 0 - - - -  Change in appetite 0 - - - -  Feeling bad or failure about yourself  0 - - - -  Trouble concentrating 0 - - - -  Moving slowly or fidgety/restless 0 - - - -  Suicidal thoughts 0 - - - -  PHQ-9 Score 0 - - - -  Difficult doing work/chores Not difficult at all - - - -     PHQ reviewed. Negative  Patient Active Problem List   Diagnosis Date Noted  . Bariatric surgery status 05/07/2018  . Overweight (BMI 25.0-29.9) 02/01/2018  . Essential hypertension 08/07/2017  . Bilateral chronic knee pain  08/07/2017  . First degree hemorrhoids   . Diverticulosis of large intestine without diverticulitis   . Iron deficiency anemia 08/15/2016  . Hx of microcytic hypochromic anemia 08/10/2016  . Preventative health care 01/19/2016  . Hypothyroidism 07/03/2011  . High cholesterol 07/03/2011  . Type 2 diabetes mellitus ([place]) 07/03/2011  . GERD (gastroesophageal reflux disease) 07/03/2011  . Asthma 07/03/2011  . PCOS (polycystic ovarian syndrome) 07/03/2011    Past Medical History:  Diagnosis Date  . Anemia   . Asthma   . Diabetes mellitus without complication ([place])   . Diverticulosis 08/2016  . GERD (gastroesophageal reflux disease)   . Heart murmur   . Hypercholesterolemia 07/03/2011  . Hypertension   . Hypothyroidism   . Morbid obesity ([place]) 08/26/2015  . Pneumonia 06/2017  . Polycystic ovaries   . Reflux   . Thyroid disease     Past Surgical History:  Procedure Laterality Date  . BILATERAL CARPAL TUNNEL RELEASE  2007  . CESAREAN SECTION  10/09/2010   FTP after IOL then had a PPH at [facility]  . CHOLECYSTECTOMY  1999  . COLONOSCOPY WITH PROPOFOL N/A 09/05/2016   Procedure: COLONOSCOPY WITH PROPOFOL;  Surgeon: Jonathon Bellows, MD;  Location: [facility] END[facility];  Service: Endoscopy;  Laterality: N/A;. Diverticulosis  . DILATION AND CURETTAGE OF UTERUS  08/18/2003   endometrial polyps and hyperplasia  . ESOPHAGOGASTRODUODENOSCOPY (EGD) WITH PROPOFOL N/A 09/05/2016  Procedure: ESOPHAGOGASTRODUODENOSCOPY (EGD) WITH PROPOFOL;  Surgeon: Jonathon Bellows, MD;  Location: [facility] END[facility];  Service: Endoscopy;  Laterality: N/A;  . 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 ROUX-EN-Y GASTRIC BYPASS  08/07/2017   Dr. Greer Pickerel  . SESMOIDECTOMY Left    fractured sesamoid bone removed from foot    Social History   Tobacco Use  . Smoking status: Former  Smoker    Packs/day: 1.50    Years: 20.00    Pack years: 30.00    Types: Cigarettes    Last attempt to quit: 09/10/2005    Years since quitting: 13.3  . Smokeless tobacco: Never Used  . Tobacco comment: quit 12 years ago   Substance Use Topics  . Alcohol use: Yes    Comment: rarely     Current Outpatient Medications:  .  albuterol (PROAIR HFA) 108 (90 Base) MCG/ACT inhaler, Inhale 2 puffs into the lungs every 4 (four) hours as needed for wheezing or shortness of breath., Disp: 1 Inhaler, Rfl: 0 .  calcium carbonate (TUMS - DOSED IN MG ELEMENTAL CALCIUM) 500 MG chewable tablet, Chew 1 tablet by mouth 3 (three) times daily. , Disp: , Rfl:  .  etonogestrel-ethinyl estradiol (NUVARING) 0.12-0.015 MG/24HR vaginal ring, Insert vaginally and leave in place for 3 consecutive weeks, then remove for 1 week., Disp: 1 each, Rfl: 11 .  fluticasone (FLONASE) 50 MCG/ACT nasal spray, Place 1 spray into both nostrils daily. , Disp: , Rfl:  .  levothyroxine (SYNTHROID, LEVOTHROID) 50 MCG tablet, Take 1 tablet (50 mcg total) by mouth daily before breakfast., Disp: 90 tablet, Rfl: 1 .  metFORMIN (GLUCOPHAGE) 1000 MG tablet, TAKE 1/2 TABLET(500 MG) BY MOUTH TWICE DAILY BEFORE A MEAL, Disp: 90 tablet, Rfl: 1 .  Multiple Vitamin (MULTIVITAMIN) tablet, Take 1 tablet by mouth 2 (two) times daily. (bariatric advantage), Disp: , Rfl:  .  omeprazole (PRILOSEC) 20 MG capsule, Take 20 mg by mouth as needed. , Disp: , Rfl:  .  ondansetron (ZOFRAN) 4 MG tablet, Take 4 mg by mouth every 8 (eight) hours as needed. , Disp: , Rfl:  .  amoxicillin-clavulanate (AUGMENTIN) 875-125 MG tablet, Take 1 tablet by mouth 2 (two) times daily. (Patient not taking: Reported on 12/30/2018), Disp: 20 tablet, Rfl: 0 .  estradiol (CLIMARA - DOSED IN MG/24 HR) 0.1 mg/24hr patch, Apply patch weekly x 2 weeks when inserting the next two Nuvarings. (Patient not taking: Reported on 12/30/2018), Disp: 4 patch, Rfl: 0 .  ONE TOUCH ULTRA TEST test  strip, , Disp: , Rfl:   Allergies  Allergen Reactions  . Nsaids Other (See Comments)    Patient had gastric bypass surgery so she cannot tolerate NSAIDs  . Adhesive [Tape] Itching    Skin peels off     ROS   No other specific complaints in a complete review of systems (except as listed in HPI above).  Objective  There were no vitals filed for this visit.   There is no height or weight on file to calculate BMI.  Nursing Note and Vital Signs reviewed.  Physical Exam   Constitutional: Patient appears well-developed and well-nourished. No distress. Sounds nasally congested HENT: Head: Normocephalic and atraumatic. Bilateral frontal and maxillary sinusitis worse in right maxillary area.  Cardiovascular: Normal rate Pulmonary/Chest: Effort normal  Musculoskeletal: Normal range of motion,  Neurological: alert and oriented,  speech normal.  Skin: No rash noted. No erythema.  Psychiatric: Patient has a normal mood and affect. behavior is normal. Judgment and thought content normal.    Assessment & Plan  1. Acute non-recurrent maxillary sinusitis - amoxicillin-clavulanate (AUGMENTIN) 250-62.5 MG/5ML suspension; Take 10 mLs (500 mg total) by mouth 3 (three) times daily.  Dispense: 210 mL; Refill: 0  2. Cough - promethazine-dextromethorphan (PROMETHAZINE-DM) 6.25-15 MG/5ML syrup; Take 2.5 mLs by mouth 4 (four) times daily as needed.  Dispense: 118 mL; Refill: 0  3. Mild intermittent asthma without complication - fluticasone furoate-vilanterol (BREO ELLIPTA) 100-25 MCG/INH AEPB; Inhale 1 puff into the lungs daily.  Dispense: 28 each; Refill: 1  4. Chest congestion - guaiFENesin (ROBITUSSIN) 100 MG/5ML liquid; Take 10-20 mLs (200-400 mg total) by mouth 3 (three) times daily as needed for cough.  Dispense: 120 mL; Refill: 0   Follow Up Instructions:   PRN  I discussed the assessment and treatment plan with the patient. The patient was provided an opportunity to ask questions  and all were answered. The patient agreed with the plan and demonstrated an understanding of the instructions.   The patient was advised to call back or seek an in-person evaluation if the symptoms worsen or if the condition fails to improve as anticipated.  I provided 18 minutes of non-face-to-face time during this encounter.   Fredderick Severance, NP

## 2019-01-05 ENCOUNTER — Encounter: Payer: Self-pay | Admitting: Family Medicine

## 2019-01-06 ENCOUNTER — Other Ambulatory Visit: Payer: Self-pay | Admitting: Nurse Practitioner

## 2019-01-06 MED ORDER — MONTELUKAST SODIUM 10 MG PO TABS: 10.0000 mg | ORAL_TABLET | Freq: Every day | ORAL | 0 refills | Status: DC

## 2019-02-06 ENCOUNTER — Encounter: Payer: 59 | Admitting: Family Medicine

## 2019-02-17 ENCOUNTER — Ambulatory Visit (INDEPENDENT_AMBULATORY_CARE_PROVIDER_SITE_OTHER): Payer: Managed Care, Other (non HMO) | Admitting: Nurse Practitioner

## 2019-02-17 ENCOUNTER — Encounter: Payer: Self-pay | Admitting: Nurse Practitioner

## 2019-02-17 ENCOUNTER — Other Ambulatory Visit: Payer: Self-pay

## 2019-02-17 VITALS — BP 118/76 | HR 70 | Temp 97.6°F | Resp 14 | Ht 67.0 in | Wt 194.6 lb

## 2019-02-17 DIAGNOSIS — E7800 Pure hypercholesterolemia, unspecified: Secondary | ICD-10-CM

## 2019-02-17 DIAGNOSIS — Z794 Long term (current) use of insulin: Secondary | ICD-10-CM

## 2019-02-17 DIAGNOSIS — E119 Type 2 diabetes mellitus without complications: Secondary | ICD-10-CM | POA: Diagnosis not present

## 2019-02-17 DIAGNOSIS — Z0000 Encounter for general adult medical examination without abnormal findings: Secondary | ICD-10-CM | POA: Diagnosis not present

## 2019-02-17 DIAGNOSIS — Z1239 Encounter for other screening for malignant neoplasm of breast: Secondary | ICD-10-CM | POA: Diagnosis not present

## 2019-02-17 DIAGNOSIS — D508 Other iron deficiency anemias: Secondary | ICD-10-CM

## 2019-02-17 MED ORDER — METFORMIN HCL 1000 MG PO TABS: 1000.0000 mg | ORAL_TABLET | Freq: Two times a day (BID) | ORAL | 1 refills | Status: DC

## 2019-02-17 NOTE — Patient Instructions (Signed)
General recommendations: 150 minutes of physical activity weekly, eat two servings of fish weekly, eat one serving of tree nuts ( cashews, pistachios, pecans, almonds.Marland Kitchen) every other day, eat 6 servings of fruit/vegetables daily and drink plenty of water and avoid sweet beverages. Recommend at least 64 ounces of water daily.   Bad cholesterol, also called low-density lipoprotein (LDL), carries cholesterol and other fats that your liver makes to your body tissue. If it builds up in blood vessels, LDL can cause heart disease and other health problems. Your LDL level should be below 100. If you have diabetes or a possible heart problem, your LDL should be below 70.  Eat: Eat 20 to 30 grams of soluble fiber every day. Foods such as fruits and vegetables, whole grains, beans, peas, nuts, and seeds can help lower LDL. Avoid: Saturated fats (Dairy foods - such as butter, cream, ghee, regular-fat milk and cheese. Meat - such as fatty cuts of beef, pork and lamb, processed meats like salami, sausages and the skin on chicken. Lard., fatty snack foods, cakes, biscuits, pies and deep fried foods) Avoid smoking  Please do call to schedule your mammogram; the number to schedule one at either [facility] or [facility] is [phone]   Stay Safe in the Sun The majority of sun exposure occurs before age 73 and skin cancer can take 20 years or more to develop. Whether your sun bathing days are behind you or you still spend time pursuing the perfect tan, you should be concerned about skin cancer.  Remember, the sun's ultraviolet (UV) rays can reflect off water, sand, concrete and snow, and can reach below the water's surface. Certain types of UV light penetrate fog and clouds, so it's possible to get sunburn even on overcast days.  Avoid direct sunlight as much as possible during the peak sun hours, generally 10 a.m. to 3 p.m., or seek shade during this part of day. Wear broad-spectrum  sunscreen - with an SPF of at least 30 - containing both UVA and UVB protection. Look for ingredients like [employer] (also known as avobenzone) or titanium dioxide on the label. Reapply sunscreen frequently, at least every two hours when outdoors, especially if you perspire or you've been swimming. Your best bet is to choose water-resistant products that are more likely to stay on your skin. Wear lip balm with an SPF 15 or higher. Wear a hat and other protective clothing while in the sun. Tightly woven fibers and darker clothing generally provide more protection. Also, look for products approved by the American Academy of Dermatology. Wear UV-protective sunglasses.

## 2019-02-17 NOTE — Progress Notes (Signed)
Name: Amber Stanley   MRN: [id number]    DOB: May 09, 1971   Date:02/17/2019       Progress Note  Subjective  Chief Complaint  Chief Complaint  Patient presents with  . Annual Exam    HPI  Patient presents for annual CPE .  Diet:  Since bariatric surgery 2018 has been doing high protein and low carb since then. [place] yogurt, cheese, peanuts, cashews, fruits- occasionally, vegetables- lots of greens. Not a lot of red meat.  Has 2-3 meals a day and snacks. Drinks cold decaf tea with splenda and coffee. Doesn't have juices or sodas.   Exercise: walks about a block a day.  USPSTF grade A and B recommendations    Office Visit from 02/17/2019 in [facility]  AUDIT-C Score  0     Depression: Phq 9 is  negative Depression screen [facility] 2/9 02/17/2019 12/30/2018 10/03/2018 05/07/2018 04/26/2018  Decreased Interest 0 0 0 0 0  Down, Depressed, Hopeless 0 0 0 0 0  PHQ - 2 Score 0 0 0 0 0  Altered sleeping 0 0 - - -  Tired, decreased energy 0 0 - - -  Change in appetite 0 0 - - -  Feeling bad or failure about yourself  0 0 - - -  Trouble concentrating 0 0 - - -  Moving slowly or fidgety/restless 0 0 - - -  Suicidal thoughts 0 0 - - -  PHQ-9 Score 0 0 - - -  Difficult doing work/chores Not difficult at all Not difficult at all - - -   Hypertension: BP Readings from Last 3 Encounters:  02/17/19 118/76  10/03/18 120/80  08/06/18 114/70   Obesity: Wt Readings from Last 3 Encounters:  02/17/19 194 lb 9.6 oz (88.3 kg)  10/03/18 186 lb 4.8 oz (84.5 kg)  08/09/18 180 lb 3.2 oz (81.7 kg)   BMI Readings from Last 3 Encounters:  02/17/19 30.48 kg/m  10/03/18 28.33 kg/m  08/09/18 27.40 kg/m    Hep C Screening: declines  STD testing and prevention (HIV/chl/gon/syphilis): declines Intimate partner violence: denies  Sexual History/Pain during Intercourse: denies  Menstrual History/LMP/Abnormal Bleeding: currently on period  Incontinence Symptoms: denies    Advanced  Care Planning: A voluntary discussion about advance care planning including the explanation and discussion of advance directives.  Discussed health care proxy and Living will, and the patient was able to identify a health care proxy as husband- Amber Stanley.  Patient does not have a living will at present time. If patient does have living will, I have requested they bring this to the clinic to be scanned in to their chart.  Breast cancer: due 03/2019 [facility] Mammogram  Date Value Ref Range Status  08/03/2006 0-4 Bi-Rad 0-4 Bi-Rad, Self Reported Normal Final    Cervical cancer screening: due 2022; sees OBGYN  Lab Results  Component Value Date   CHOL 205 (H) 09/04/2018   CHOL 160 04/16/2018   CHOL 171 01/31/2017   Lab Results  Component Value Date   HDL 46 09/04/2018   HDL 37 (L) 04/16/2018   HDL 31 (L) 01/31/2017   Lab Results  Component Value Date   LDLCALC 121 (H) 09/04/2018   LDLCALC 94 04/16/2018   LDLCALC 100 (H) 01/31/2017   Lab Results  Component Value Date   TRIG 190 (H) 09/04/2018   TRIG 143 04/16/2018   TRIG 200 (H) 01/31/2017   Lab Results  Component Value Date   CHOLHDL 4.5 (  H) 09/04/2018   CHOLHDL 4.3 04/16/2018   CHOLHDL 5.5 (H) 01/31/2017   No results found for: LDLDIRECT  Glucose:  Glucose  Date Value Ref Range Status  09/04/2018 99 65 - 99 mg/dL Final  06/04/2017 148 (H) 65 - 99 mg/dL Final  01/19/2016 158 (H) 65 - 99 mg/dL Final   Glucose, Bld  Date Value Ref Range Status  08/08/2017 207 (H) 65 - 99 mg/dL Final  08/03/2017 198 (H) 65 - 99 mg/dL Final   Glucose-Capillary  Date Value Ref Range Status  08/08/2017 187 (H) 65 - 99 mg/dL Final  08/08/2017 185 (H) 65 - 99 mg/dL Final  08/08/2017 203 (H) 65 - 99 mg/dL Final    Skin cancer: discussed Colorectal cancer: due at 9  Lung cancer:  Low Dose CT Chest recommended if Age 48-80 years, 30 pack-year currently smoking OR have quit w/in 15years. Patient does not qualify.   Patient Active Problem  List   Diagnosis Date Noted  . Bariatric surgery status 05/07/2018  . Overweight (BMI 25.0-29.9) 02/01/2018  . Essential hypertension 08/07/2017  . Bilateral chronic knee pain 08/07/2017  . First degree hemorrhoids   . Diverticulosis of large intestine without diverticulitis   . Iron deficiency anemia 08/15/2016  . Hx of microcytic hypochromic anemia 08/10/2016  . Preventative health care 01/19/2016  . Hypothyroidism 07/03/2011  . High cholesterol 07/03/2011  . Type 2 diabetes mellitus ([place]) 07/03/2011  . GERD (gastroesophageal reflux disease) 07/03/2011  . Asthma 07/03/2011  . PCOS (polycystic ovarian syndrome) 07/03/2011    Past Surgical History:  Procedure Laterality Date  . BILATERAL CARPAL TUNNEL RELEASE  2007  . CESAREAN SECTION  10/09/2010   FTP after IOL then had a PPH at [facility]  . CHOLECYSTECTOMY  1999  . COLONOSCOPY WITH PROPOFOL N/A 09/05/2016   Procedure: COLONOSCOPY WITH PROPOFOL;  Surgeon: Jonathon Bellows, MD;  Location: [facility] ENDOSCOPY;  Service: Endoscopy;  Laterality: N/A;. Diverticulosis  . DILATION AND CURETTAGE OF UTERUS  08/18/2003   endometrial polyps and hyperplasia  . ESOPHAGOGASTRODUODENOSCOPY (EGD) WITH PROPOFOL N/A 09/05/2016   Procedure: ESOPHAGOGASTRODUODENOSCOPY (EGD) WITH PROPOFOL;  Surgeon: Jonathon Bellows, MD;  Location: [facility] ENDOSCOPY;  Service: Endoscopy;  Laterality: N/A;  . 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 ROUX-EN-Y GASTRIC BYPASS  08/07/2017   Dr. Greer Pickerel  . SESMOIDECTOMY Left    fractured sesamoid bone removed from foot    Family History  Problem Relation Age of Onset  . Diabetes Mother   . Hypertension Mother   . Heart attack Mother 81       died from MI  . Heart disease Sister 29  . Cancer Brother 58       leukemia  . Stroke Maternal Grandmother   . Hypertension Maternal  Grandmother   . Diabetes Paternal Grandmother   . Hypertension Paternal Grandmother   . Stroke Paternal Grandmother        mini strokes  . Ulcers Maternal Grandfather   . Lymphoma Cousin   . Hypertension Maternal Uncle   . Kidney failure Maternal Uncle   . Thyroid disease Paternal Aunt   . Multiple sclerosis Paternal Aunt   . COPD Neg Hx   . Breast cancer Neg Hx     Social History   Socioeconomic History  . Marital status: Married    Spouse name: Grayland Ormond  . Number  of children: 1  . Years of education: 57  . Highest education level: Not on file  Occupational History  . Occupation: office work  Scientific laboratory technician  . Financial resource strain: Not hard at all  . Food insecurity    Worry: Never true    Inability: Never true  . Transportation needs    Medical: No    Non-medical: No  Tobacco Use  . Smoking status: Former Smoker    Packs/day: 1.50    Years: 20.00    Pack years: 30.00    Types: Cigarettes    Quit date: 09/10/2005    Years since quitting: 13.4  . Smokeless tobacco: Never Used  . Tobacco comment: quit 12 years ago   Substance and Sexual Activity  . Alcohol use: Yes    Comment: rarely  . Drug use: No  . Sexual activity: Yes    Partners: Male    Birth control/protection: Inserts    Comment: Nuvaring  Lifestyle  . Physical activity    Days per week: 0 days    Minutes per session: 0 min  . Stress: Rather much  Relationships  . Social connections    Talks on phone: More than three times a week    Gets together: More than three times a week    Attends religious service: 1 to 4 times per year    Active member of club or organization: Yes    Attends meetings of clubs or organizations: More than 4 times per year    Relationship status: Married  . Intimate partner violence    Fear of current or ex partner: No    Emotionally abused: No    Physically abused: No    Forced sexual activity: No  Other Topics Concern  . Not on file  Social History Narrative  . Not  on file     Current Outpatient Medications:  .  albuterol (PROAIR HFA) 108 (90 Base) MCG/ACT inhaler, Inhale 2 puffs into the lungs every 4 (four) hours as needed for wheezing or shortness of breath., Disp: 1 Inhaler, Rfl: 0 .  calcium carbonate (TUMS - DOSED IN MG ELEMENTAL CALCIUM) 500 MG chewable tablet, Chew 1 tablet by mouth 3 (three) times daily. , Disp: , Rfl:  .  etonogestrel-ethinyl estradiol (NUVARING) 0.12-0.015 MG/24HR vaginal ring, Insert vaginally and leave in place for 3 consecutive weeks, then remove for 1 week., Disp: 1 each, Rfl: 11 .  fluticasone (FLONASE) 50 MCG/ACT nasal spray, Place 1 spray into both nostrils daily. , Disp: , Rfl:  .  fluticasone furoate-vilanterol (BREO ELLIPTA) 100-25 MCG/INH AEPB, Inhale 1 puff into the lungs daily., Disp: 28 each, Rfl: 1 .  levothyroxine (SYNTHROID, LEVOTHROID) 50 MCG tablet, Take 1 tablet (50 mcg total) by mouth daily before breakfast., Disp: 90 tablet, Rfl: 1 .  metFORMIN (GLUCOPHAGE) 1000 MG tablet, TAKE 1/2 TABLET(500 MG) BY MOUTH TWICE DAILY BEFORE A MEAL, Disp: 90 tablet, Rfl: 1 .  montelukast (SINGULAIR) 10 MG tablet, TAKE 1 TABLET(10 MG) BY MOUTH AT BEDTIME, Disp: 90 tablet, Rfl: 0 .  Multiple Vitamin (MULTIVITAMIN) tablet, Take 1 tablet by mouth 2 (two) times daily. (bariatric advantage), Disp: , Rfl:  .  omeprazole (PRILOSEC) 20 MG capsule, Take 20 mg by mouth as needed. , Disp: , Rfl:  .  ondansetron (ZOFRAN) 4 MG tablet, Take 4 mg by mouth every 8 (eight) hours as needed. , Disp: , Rfl:  .  amoxicillin-clavulanate (AUGMENTIN) 250-62.5 MG/5ML suspension, Take 10 mLs (500 mg  total) by mouth 3 (three) times daily. (Patient not taking: Reported on 02/17/2019), Disp: 210 mL, Rfl: 0 .  guaiFENesin (ROBITUSSIN) 100 MG/5ML liquid, Take 10-20 mLs (200-400 mg total) by mouth 3 (three) times daily as needed for cough. (Patient not taking: Reported on 02/17/2019), Disp: 120 mL, Rfl: 0 .  ONE TOUCH ULTRA TEST test strip, , Disp: , Rfl:  .   promethazine-dextromethorphan (PROMETHAZINE-DM) 6.25-15 MG/5ML syrup, Take 2.5 mLs by mouth 4 (four) times daily as needed. (Patient not taking: Reported on 02/17/2019), Disp: 118 mL, Rfl: 0  Allergies  Allergen Reactions  . Nsaids Other (See Comments)    Patient had gastric bypass surgery so she cannot tolerate NSAIDs  . Adhesive [Tape] Itching    Skin peels off      Review of Systems  Constitutional: Negative for chills, fever and malaise/fatigue.  HENT: Negative for congestion, sinus pain and sore throat.   Eyes: Negative for blurred vision and double vision.  Respiratory: Negative for cough and shortness of breath.   Cardiovascular: Negative for chest pain, palpitations and leg swelling.  Gastrointestinal: Negative for abdominal pain, blood in stool, constipation, diarrhea and nausea.  Genitourinary: Negative for dysuria and hematuria.  Musculoskeletal: Negative for falls and joint pain.  Skin: Negative for rash.  Neurological: Negative for dizziness, tingling and headaches.  Endo/Heme/Allergies: Negative for polydipsia.  Psychiatric/Behavioral: The patient is not nervous/anxious and does not have insomnia.       Objective  Vitals:   02/17/19 1550  BP: 118/76  Pulse: 70  Resp: 14  Temp: 97.6 F (36.4 C)  TempSrc: Tympanic  SpO2: 98%  Weight: 194 lb 9.6 oz (88.3 kg)  Height: 5\' 7"  (1.702 m)    Body mass index is 30.48 kg/m.  Physical Exam Constitutional: Patient appears well-developed and well-nourished. No distress.  HENT: Head: Normocephalic and atraumatic. Ears: B TMs ok, no erythema or effusion;  Eyes: Conjunctivae and EOM are normal. Pupils are equal, round, and reactive to light. No scleral icterus.  Neck: Normal range of motion. Neck supple. No JVD present. No thyromegaly present.  Cardiovascular: Normal rate, regular rhythm and normal heart sounds.  No murmur heard. No BLE edema. Pulmonary/Chest: Effort normal and breath sounds normal. No respiratory  distress. Abdominal: Soft. Bowel sounds are normal, no distension. There is no tenderness. no masses Breast:deferred patient preference- get it done at [place]: deferred.  Musculoskeletal: Normal range of motion, no joint effusions. No gross deformities Neurological: he is alert and oriented to person, place, and time. No cranial nerve deficit. Coordination, balance, strength, speech and gait are normal.  Skin: Skin is warm and dry. No rash noted. No erythema.  Psychiatric: Patient has a normal mood and affect. behavior is normal. Judgment and thought content normal.    No results found for this or any previous visit (from the past 2160 hour(s)).   Fall Risk: Fall Risk  02/17/2019 12/30/2018 10/03/2018 07/08/2018 05/07/2018  Falls in the past year? 0 0 0 0 No  Number falls in past yr: 0 - 0 - -  Injury with Fall? 0 - 0 - -  Comment - - - - -    Diabetic Foot Exam - Simple   Simple Foot Form Visual Inspection See comments: Yes Sensation Testing Intact to touch and monofilament testing bilaterally: Yes Pulse Check Posterior Tibialis and Dorsalis pulse intact bilaterally: Yes Comments     Functional Status Survey: Is the patient deaf or have difficulty hearing?: No Does the  patient have difficulty seeing, even when wearing glasses/contacts?: No Does the patient have difficulty concentrating, remembering, or making decisions?: No Does the patient have difficulty walking or climbing stairs?: No Does the patient have difficulty dressing or bathing?: No Does the patient have difficulty doing errands alone such as visiting a doctor's office or shopping?: No   Assessment & Plan  1. Preventative health care - MM Digital Screening; Future - CBC with Differential - Comprehensive metabolic panel - Lipid Profile - Urine Microalbumin w/creat. ratio - HgB A1c  2. Screening for breast cancer - MM Digital Screening; Future  3. Type 2 diabetes mellitus without  complication, with long-term current use of insulin (HCC) Fasting sugars have increased to 120-130 patient requesting to increase her metformin to BID - Comprehensive metabolic panel - Urine Microalbumin w/creat. ratio - HgB A1c - metFORMIN (GLUCOPHAGE) 1000 MG tablet; Take 1 tablet (1,000 mg total) by mouth 2 (two) times daily with a meal.  Dispense: 180 tablet; Refill: 1  4. High cholesterol - Lipid Profile  5. Other iron deficiency anemia - CBC with Differential  -USPSTF grade A and B recommendations reviewed with patient; age-appropriate recommendations, preventive care, screening tests, etc discussed and encouraged; healthy living encouraged; see AVS for patient education given to patient -Discussed importance of 150 minutes of physical activity weekly, eat two servings of fish weekly, eat one serving of tree nuts ( cashews, pistachios, pecans, almonds.Marland Kitchen) every other day, eat 6 servings of fruit/vegetables daily and drink plenty of water and avoid sweet beverages.

## 2019-02-18 LAB — MICROALBUMIN / CREATININE URINE RATIO
Creatinine, Urine: 40.7 mg/dL
Microalb/Creat Ratio: <7 mg/g creat (ref 0–29)
Microalbumin, Urine: <3 ug/mL

## 2019-02-18 LAB — LIPID PANEL
Chol/HDL Ratio: 3.6 ratio (ref 0.0–4.4)
Cholesterol, Total: 151 mg/dL (ref 100–199)
HDL: 42 mg/dL (ref >39)
LDL Calculated: 92 mg/dL (ref 0–99)
Triglycerides: 84 mg/dL (ref 0–149)
VLDL Cholesterol Cal: 17 mg/dL (ref 5–40)

## 2019-02-18 LAB — COMPREHENSIVE METABOLIC PANEL
ALT: 12 IU/L (ref 0–32)
AST: 14 IU/L (ref 0–40)
Albumin/Globulin Ratio: 1.8 (ref 1.2–2.2)
Albumin: 4 g/dL (ref 3.8–4.8)
Alkaline Phosphatase: 43 IU/L (ref 39–117)
BUN/Creatinine Ratio: 17 (ref 9–23)
BUN: 11 mg/dL (ref 6–24)
Bilirubin Total: <0.2 mg/dL (ref 0.0–1.2)
CO2: 22 mmol/L (ref 20–29)
Calcium: 8.9 mg/dL (ref 8.7–10.2)
Chloride: 103 mmol/L (ref 96–106)
Creatinine, Ser: 0.65 mg/dL (ref 0.57–1.00)
GFR calc Af Amer: 122 mL/min/{1.73_m2} (ref >59)
GFR calc non Af Amer: 106 mL/min/{1.73_m2} (ref >59)
Globulin, Total: 2.2 g/dL (ref 1.5–4.5)
Glucose: 145 mg/dL — ABNORMAL HIGH (ref 65–99)
Potassium: 4.1 mmol/L (ref 3.5–5.2)
Sodium: 137 mmol/L (ref 134–144)
Total Protein: 6.2 g/dL (ref 6.0–8.5)

## 2019-02-18 LAB — CBC WITH DIFFERENTIAL/PLATELET
Basophils Absolute: 0.1 10*3/uL (ref 0.0–0.2)
Basos: 1 %
EOS (ABSOLUTE): 0.3 10*3/uL (ref 0.0–0.4)
Eos: 4 %
Hematocrit: 40 % (ref 34.0–46.6)
Hemoglobin: 13.4 g/dL (ref 11.1–15.9)
Immature Grans (Abs): 0 10*3/uL (ref 0.0–0.1)
Immature Granulocytes: 0 %
Lymphocytes Absolute: 2.9 10*3/uL (ref 0.7–3.1)
Lymphs: 43 %
MCH: 29.8 pg (ref 26.6–33.0)
MCHC: 33.5 g/dL (ref 31.5–35.7)
MCV: 89 fL (ref 79–97)
Monocytes Absolute: 0.4 10*3/uL (ref 0.1–0.9)
Monocytes: 6 %
Neutrophils Absolute: 3.1 10*3/uL (ref 1.4–7.0)
Neutrophils: 46 %
Platelets: 283 10*3/uL (ref 150–450)
RBC: 4.5 x10E6/uL (ref 3.77–5.28)
RDW: 12.7 % (ref 11.7–15.4)
WBC: 6.7 10*3/uL (ref 3.4–10.8)

## 2019-02-18 LAB — HEMOGLOBIN A1C
Est. average glucose Bld gHb Est-mCnc: 131 mg/dL
Hgb A1c MFr Bld: 6.2 % — ABNORMAL HIGH (ref 4.8–5.6)

## 2019-03-04 ENCOUNTER — Ambulatory Visit: Payer: Managed Care, Other (non HMO) | Admitting: Family Medicine

## 2019-03-11 ENCOUNTER — Other Ambulatory Visit: Payer: Self-pay | Admitting: Family Medicine

## 2019-04-11 ENCOUNTER — Other Ambulatory Visit: Payer: Self-pay | Admitting: Nurse Practitioner

## 2019-05-13 ENCOUNTER — Other Ambulatory Visit: Payer: Self-pay

## 2019-05-13 DIAGNOSIS — E119 Type 2 diabetes mellitus without complications: Secondary | ICD-10-CM

## 2019-05-13 DIAGNOSIS — Z794 Long term (current) use of insulin: Secondary | ICD-10-CM

## 2019-05-13 MED ORDER — METFORMIN HCL 1000 MG PO TABS: 1000.0000 mg | ORAL_TABLET | Freq: Two times a day (BID) | ORAL | 0 refills | Status: DC

## 2019-05-22 ENCOUNTER — Ambulatory Visit: Payer: Managed Care, Other (non HMO) | Admitting: Family Medicine

## 2019-05-26 ENCOUNTER — Encounter: Payer: Self-pay | Admitting: Family Medicine

## 2019-05-26 ENCOUNTER — Ambulatory Visit: Payer: Managed Care, Other (non HMO) | Admitting: Family Medicine

## 2019-05-26 ENCOUNTER — Other Ambulatory Visit: Payer: Self-pay

## 2019-05-26 VITALS — BP 124/76 | HR 82 | Temp 97.9°F | Resp 16 | Ht 67.5 in | Wt 195.6 lb

## 2019-05-26 DIAGNOSIS — E034 Atrophy of thyroid (acquired): Secondary | ICD-10-CM

## 2019-05-26 DIAGNOSIS — Z6830 Body mass index (BMI) 30.0-30.9, adult: Secondary | ICD-10-CM

## 2019-05-26 DIAGNOSIS — E785 Hyperlipidemia, unspecified: Secondary | ICD-10-CM

## 2019-05-26 DIAGNOSIS — Z9884 Bariatric surgery status: Secondary | ICD-10-CM

## 2019-05-26 DIAGNOSIS — J4520 Mild intermittent asthma, uncomplicated: Secondary | ICD-10-CM

## 2019-05-26 DIAGNOSIS — E1169 Type 2 diabetes mellitus with other specified complication: Secondary | ICD-10-CM

## 2019-05-26 DIAGNOSIS — E6609 Other obesity due to excess calories: Secondary | ICD-10-CM

## 2019-05-26 DIAGNOSIS — E119 Type 2 diabetes mellitus without complications: Secondary | ICD-10-CM | POA: Diagnosis not present

## 2019-05-26 DIAGNOSIS — E282 Polycystic ovarian syndrome: Secondary | ICD-10-CM

## 2019-05-26 DIAGNOSIS — I10 Essential (primary) hypertension: Secondary | ICD-10-CM | POA: Diagnosis not present

## 2019-05-26 DIAGNOSIS — K219 Gastro-esophageal reflux disease without esophagitis: Secondary | ICD-10-CM | POA: Diagnosis not present

## 2019-05-26 LAB — POCT GLYCOSYLATED HEMOGLOBIN (HGB A1C): HbA1c, POC (prediabetic range): 6.2 % (ref 5.7–6.4)

## 2019-05-26 MED ORDER — BREO ELLIPTA 100-25 MCG/INH IN AEPB: 1.0000 | INHALATION_SPRAY | Freq: Every day | RESPIRATORY_TRACT | 6 refills | Status: DC

## 2019-05-26 MED ORDER — MONTELUKAST SODIUM 10 MG PO TABS: ORAL_TABLET | ORAL | 1 refills | Status: DC

## 2019-05-26 NOTE — Progress Notes (Signed)
Name: Amber Stanley   MRN: [id number]    DOB: [birth year]   Date:05/26/2019       Progress Note  Subjective  Chief Complaint  Chief Complaint  Patient presents with  . Asthma     follow up  . Diabetes  . Hypothyroidism    HPI  Patient presents for follow-up for the following chronic conditions:  Hypertension: patient is diet controlled; lost weight and has been able to come off blood pressure medications.  Denies chest pain, shortness of breath, BLE edema, lightheadedness/dizziness.   Asthma: patient takes albuterol as needed. Taking Breo daily and this really helps her symptoms.  She is also taking singulair.  Occasional nighttime coughing, otherwise feels a bit congested recently with the weather changes; has history of pneumonia.  Not taking antihistamine at this time.   GERD: patient takes prilosec 20mg  maybe once a month and tums daily for calcium supplementation only.  No difficulty swallowing, abdominal pain, blood in stool, dark and tarry stool, no nausea/vomiting/regurgitation.  Hypothyroidism: patient takes 52mcg of synthroid daily. TSH1/15/2020 WNL- 1.38.  Denies palpitations, hair/skin/nail changes, no constipation or diarrhea.  Diabetes 2: patient takes metformin 1000mg  BID. Well controlled- last A1C was 6.1 09/04/2018.  She denies polyuria, polyphagia, or polydipsia.  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   Obesity/S/p Gastric Bypass: Walks to her neighbor's house and rides bikes almost daily.  Diet is balanced.  Heaviest weight 275lbs, lowest weight was 175lbs.  She is currently 89.  She did have anemia, but this has resolved since stopping daily prilosec.  Sees Dr. Redmond Pulling at [facility] annually for B12/Vitamin D and other labs - due for this in the next month or so.  Hyperlipidemia: patient is not on statin; has been on statin in the past. Last LDL 02/17/2019 and LDL was above goal of <70 at 89.  She does not want to take  statin therapy. Denies chest pain, shortness of breath.  PCOS: patient is taking metformin. Using nuvaring- states sees OBGYN- [facility].  No concerns.  Allergies: takes flonase PRN   Patient Active Problem List   Diagnosis Date Noted  . Hyperlipidemia due to type 2 diabetes mellitus ([place]) 05/26/2019  . Mild intermittent asthma without complication [id number]  . Bariatric surgery status 05/07/2018  . Overweight (BMI 25.0-29.9) 02/01/2018  . Essential hypertension 08/07/2017  . Bilateral chronic knee pain 08/07/2017  . Diverticulosis of large intestine without diverticulitis   . Iron deficiency anemia 08/15/2016  . Hx of microcytic hypochromic anemia 08/10/2016  . Hypothyroidism 07/03/2011  . High cholesterol 07/03/2011  . Type 2 diabetes mellitus ([place]) 07/03/2011  . GERD (gastroesophageal reflux disease) 07/03/2011  . PCOS (polycystic ovarian syndrome) 07/03/2011    Past Surgical History:  Procedure Laterality Date  . BILATERAL CARPAL TUNNEL RELEASE  2007  . CESAREAN SECTION  10/09/2010   FTP after IOL then had a PPH at [facility]  . CHOLECYSTECTOMY  1999  . COLONOSCOPY WITH PROPOFOL N/A 09/05/2016   Procedure: COLONOSCOPY WITH PROPOFOL;  Surgeon: Jonathon Bellows, MD;  Location: [facility] ENDOSCOPY;  Service: Endoscopy;  Laterality: N/A;. Diverticulosis  . DILATION AND CURETTAGE OF UTERUS  08/18/2003   endometrial polyps and hyperplasia  . ESOPHAGOGASTRODUODENOSCOPY (EGD) WITH PROPOFOL N/A 09/05/2016   Procedure: ESOPHAGOGASTRODUODENOSCOPY (EGD) WITH PROPOFOL;  Surgeon: Jonathon Bellows, MD;  Location: [facility] ENDOSCOPY;  Service: Endoscopy;  Laterality: N/A;  . 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 ROUX-EN-Y GASTRIC BYPASS  08/07/2017   Dr. Greer Pickerel  . SESMOIDECTOMY Left    fractured sesamoid bone removed from foot    Family History   Problem Relation Age of Onset  . Diabetes Mother   . Hypertension Mother   . Heart attack Mother 24       died from MI  . Heart disease Sister 55  . Cancer Brother 63       leukemia  . Stroke Maternal Grandmother   . Hypertension Maternal Grandmother   . Diabetes Paternal Grandmother   . Hypertension Paternal Grandmother   . Stroke Paternal Grandmother        mini strokes  . Ulcers Maternal Grandfather   . Lymphoma Cousin   . Hypertension Maternal Uncle   . Kidney failure Maternal Uncle   . Thyroid disease Paternal Aunt   . Multiple sclerosis Paternal Aunt   . COPD Neg Hx   . Breast cancer Neg Hx     Social History   Socioeconomic History  . Marital status: Married    Spouse name: Grayland Ormond  . Number of children: 1  . Years of education: 71  . Highest education level: Not on file  Occupational History  . Occupation: office work  Scientific laboratory technician  . Financial resource strain: Not hard at all  . Food insecurity    Worry: Never true    Inability: Never true  . Transportation needs    Medical: No    Non-medical: No  Tobacco Use  . Smoking status: Former Smoker    Packs/day: 1.50    Years: 20.00    Pack years: 30.00    Types: Cigarettes    Quit date: 09/10/2005    Years since quitting: 13.7  . Smokeless tobacco: Never Used  . Tobacco comment: quit 12 years ago   Substance and Sexual Activity  . Alcohol use: Yes    Comment: rarely  . Drug use: No  . Sexual activity: Yes    Partners: Male    Birth control/protection: Inserts    Comment: Nuvaring  Lifestyle  . Physical activity    Days per week: 0 days    Minutes per session: 0 min  . Stress: Rather much  Relationships  . Social connections    Talks on phone: More than three times a week    Gets together: More than three times a week    Attends religious service: 1 to 4 times per year    Active member of club or organization: Yes    Attends meetings of clubs or organizations: More than 4 times per year     Relationship status: Married  . Intimate partner violence    Fear of current or ex partner: No    Emotionally abused: No    Physically abused: No    Forced sexual activity: No  Other Topics Concern  . Not on file  Social History Narrative  . Not on file     Current Outpatient Medications:  .  albuterol (PROAIR HFA) 108 (90 Base) MCG/ACT inhaler, Inhale 2 puffs into the lungs every 4 (four) hours as needed for wheezing or shortness of breath., Disp: 1 Inhaler, Rfl: 0 .  calcium carbonate (TUMS - DOSED IN MG ELEMENTAL CALCIUM) 500 MG chewable tablet, Chew 1 tablet by mouth 3 (three) times daily. , Disp: , Rfl:  .  etonogestrel-ethinyl estradiol (NUVARING) 0.12-0.015  MG/24HR vaginal ring, Insert vaginally and leave in place for 3 consecutive weeks, then remove for 1 week., Disp: 1 each, Rfl: 11 .  fluticasone (FLONASE) 50 MCG/ACT nasal spray, Place 1 spray into both nostrils daily. , Disp: , Rfl:  .  fluticasone furoate-vilanterol (BREO ELLIPTA) 100-25 MCG/INH AEPB, Inhale 1 puff into the lungs daily., Disp: 28 each, Rfl: 6 .  levothyroxine (SYNTHROID) 50 MCG tablet, TAKE 1 TABLET(50 MCG) BY MOUTH DAILY BEFORE BREAKFAST, Disp: 90 tablet, Rfl: 1 .  metFORMIN (GLUCOPHAGE) 1000 MG tablet, Take 1 tablet (1,000 mg total) by mouth 2 (two) times daily with a meal., Disp: 180 tablet, Rfl: 0 .  montelukast (SINGULAIR) 10 MG tablet, TAKE 1 TABLET(10 MG) BY MOUTH AT BEDTIME, Disp: 90 tablet, Rfl: 1 .  Multiple Vitamin (MULTIVITAMIN) tablet, Take 1 tablet by mouth 2 (two) times daily. (bariatric advantage), Disp: , Rfl:  .  omeprazole (PRILOSEC) 20 MG capsule, Take 20 mg by mouth as needed. , Disp: , Rfl:  .  ONE TOUCH ULTRA TEST test strip, , Disp: , Rfl:  .  ondansetron (ZOFRAN) 4 MG tablet, Take 4 mg by mouth every 8 (eight) hours as needed. , Disp: , Rfl:   Allergies  Allergen Reactions  . Nsaids Other (See Comments)    Patient had gastric bypass surgery so she cannot tolerate NSAIDs  . Adhesive  [Tape] Itching    Skin peels off     I personally reviewed active problem list, medication list, allergies, health maintenance, notes from last encounter, lab results with the patient/caregiver today.   ROS  Constitutional: Negative for fever or weight change.  Respiratory: Negative for cough and shortness of breath.   Cardiovascular: Negative for chest pain or palpitations.  Gastrointestinal: Negative for abdominal pain, no bowel changes.  Musculoskeletal: Negative for gait problem or joint swelling.  Skin: Negative for rash.  Neurological: Negative for dizziness or headache.  No other specific complaints in a complete review of systems (except as listed in HPI above).  Objective  Vitals:   05/26/19 0850  BP: 124/76  Pulse: 82  Resp: 16  Temp: 97.9 F (36.6 C)  TempSrc: Oral  SpO2: 93%  Weight: 195 lb 9.6 oz (88.7 kg)  Height: 5' 7.5" (1.715 m)    Body mass index is 30.18 kg/m.  Physical Exam  Constitutional: Patient appears well-developed and well-nourished. No distress.  HENT: Head: Normocephalic and atraumatic.  Eyes: Conjunctivae and EOM are normal. No scleral icterus.   Neck: Normal range of motion. Neck supple. No JVD present. No thyromegaly present.  Cardiovascular: Normal rate, regular rhythm and normal heart sounds.  No murmur heard. No BLE edema. Pulmonary/Chest: Effort normal and breath sounds normal. No respiratory distress. Musculoskeletal: Normal range of motion, no joint effusions. No gross deformities Neurological: Pt is alert and oriented to person, place, and time. No cranial nerve deficit. Coordination, balance, strength, speech and gait are normal.  Skin: Skin is warm and dry. No rash noted. No erythema.  Psychiatric: Patient has a normal mood and affect. behavior is normal. Judgment and thought content normal.  Results for orders placed or performed in visit on 05/26/19 (from the past 72 hour(s))  POCT glycosylated hemoglobin (Hb A1C)      Status: None   Collection Time: 05/26/19  9:25 AM  Result Value Ref Range   Hemoglobin A1C     HbA1c POC (<> result, manual entry)     HbA1c, POC (prediabetic range) 6.2 5.7 - 6.4 %  HbA1c, POC (controlled diabetic range)      Diabetic Foot Exam: Diabetic Foot Exam - Simple   Simple Foot Form Diabetic Foot exam was performed with the following findings: Yes 05/26/2019  9:18 AM  Visual Inspection No deformities, no ulcerations, no other skin breakdown bilaterally: Yes Sensation Testing Intact to touch and monofilament testing bilaterally: Yes Pulse Check Posterior Tibialis and Dorsalis pulse intact bilaterally: Yes Comments     PHQ2/9: Depression screen [facility] 2/9 05/26/2019 02/17/2019 12/30/2018 10/03/2018 05/07/2018  Decreased Interest 0 0 0 0 0  Down, Depressed, Hopeless 0 0 0 0 0  PHQ - 2 Score 0 0 0 0 0  Altered sleeping 0 0 0 - -  Tired, decreased energy 0 0 0 - -  Change in appetite 0 0 0 - -  Feeling bad or failure about yourself  0 0 0 - -  Trouble concentrating 0 0 0 - -  Moving slowly or fidgety/restless 0 0 0 - -  Suicidal thoughts 0 0 0 - -  PHQ-9 Score 0 0 0 - -  Difficult doing work/chores Not difficult at all Not difficult at all Not difficult at all - -   PHQ-2/9 Result is negative.    Fall Risk: Fall Risk  05/26/2019 02/17/2019 12/30/2018 10/03/2018 07/08/2018  Falls in the past year? 0 0 0 0 0  Number falls in past yr: 0 0 - 0 -  Injury with Fall? 0 0 - 0 -  Comment - - - - -  Follow up Falls evaluation completed - - - -    Assessment & Plan  1. Essential hypertension - Stable and diet controlled  2. Mild intermittent asthma without complication - fluticasone furoate-vilanterol (BREO ELLIPTA) 100-25 MCG/INH AEPB; Inhale 1 puff into the lungs daily.  Dispense: 28 each; Refill: 6 - montelukast (SINGULAIR) 10 MG tablet; TAKE 1 TABLET(10 MG) BY MOUTH AT BEDTIME  Dispense: 90 tablet; Refill: 1  3. Gastroesophageal reflux disease, unspecified whether  esophagitis present - Prilosec PRN  4. Hypothyroidism due to acquired atrophy of thyroid - Labs at next visit; asymptomatic today.  5. Type 2 diabetes mellitus without complication, without long-term current use of insulin (HCC) - A1C is stable at 6.2% - maintain current regimen - POCT glycosylated hemoglobin (Hb A1C)  6. Class 1 obesity due to excess calories with serious comorbidity and body mass index (BMI) of 30.0 to 30.9 in adult - Discussed importance of 150 minutes of physical activity weekly, eat two servings of fish weekly, eat one serving of tree nuts ( cashews, pistachios, pecans, almonds.Marland Kitchen) every other day, eat 6 servings of fruit/vegetables daily and drink plenty of water and avoid sweet beverages.   7. Bariatric surgery status - Needs to schedule follow up with her surgeon.  8. Hyperlipidemia due to type 2 diabetes mellitus ([place]) - Does not want to take crestor; labs at next visit.  9. PCOS (polycystic ovarian syndrome) - Taking metformin, seeing GYN

## 2019-07-25 ENCOUNTER — Other Ambulatory Visit: Payer: Self-pay | Admitting: Family Medicine

## 2019-07-25 DIAGNOSIS — J4520 Mild intermittent asthma, uncomplicated: Secondary | ICD-10-CM

## 2019-07-31 ENCOUNTER — Other Ambulatory Visit: Payer: Self-pay | Admitting: Certified Nurse Midwife

## 2019-07-31 NOTE — Telephone Encounter (Signed)
Pt called and states that the pharmacy did not receive medication montelukast (SINGULAIR) 10 MG tablet [id number] on 05/26/19. Pt would like to know if medication can be sent back in today. I called the pharmacy to confirm they did not have this. Please advise

## 2019-08-01 MED ORDER — MONTELUKAST SODIUM 10 MG PO TABS: ORAL_TABLET | ORAL | 1 refills | Status: DC

## 2019-08-01 NOTE — Telephone Encounter (Signed)
Order pend to [facility] for refill

## 2019-08-17 ENCOUNTER — Other Ambulatory Visit: Payer: Self-pay | Admitting: Family Medicine

## 2019-08-17 DIAGNOSIS — Z794 Long term (current) use of insulin: Secondary | ICD-10-CM

## 2019-08-17 DIAGNOSIS — E119 Type 2 diabetes mellitus without complications: Secondary | ICD-10-CM

## 2019-08-18 ENCOUNTER — Telehealth: Payer: Self-pay | Admitting: Certified Nurse Midwife

## 2019-08-18 ENCOUNTER — Other Ambulatory Visit: Payer: Self-pay | Admitting: Certified Nurse Midwife

## 2019-08-18 MED ORDER — ETONOGESTREL-ETHINYL ESTRADIOL 0.12-0.015 MG/24HR VA RING: VAGINAL_RING | VAGINAL | 0 refills | Status: DC

## 2019-08-18 NOTE — Telephone Encounter (Signed)
Pt aware refill eRx'd. 

## 2019-08-18 NOTE — Telephone Encounter (Signed)
Patient is schedule on 09/16/19 with [facility] requesting refill on Birth Control. Please advise

## 2019-08-26 NOTE — Telephone Encounter (Signed)
Patient is reschedule to 10/09/19

## 2019-08-26 NOTE — Telephone Encounter (Signed)
Please schedule pt

## 2019-09-05 ENCOUNTER — Other Ambulatory Visit: Payer: Self-pay | Admitting: Family Medicine

## 2019-09-11 NOTE — Progress Notes (Signed)
Patient ID: Amber Stanley, female    DOB: 1970-12-12, 49 y.o.   MRN: [id number]  PCP: Hubbard Hartshorn, FNP  Chief Complaint  Patient presents with  . Diabetes  . Hyperlipidemia  . Hypothyroidism    Subjective:   Amber Stanley is a 49 y.o. female, presents to clinic with CC of the following:  Chief Complaint  Patient presents with  . Diabetes  . Hyperlipidemia  . Hypothyroidism    HPI:  Patient's last visit was 05/26/2019 with Raelyn Ensign.  Daughter is with her for the visit Patient presents for follow-up for the following:   Hypertension:. Is no longer taking BP meds, off after losing weight successfully Diet to manage - trying to eat healthy to help keep weight controlled, doing well with this No CP, SOB, increased HA's, LE swelling BP Readings from Last 3 Encounters:  09/12/19 110/72  05/26/19 124/76  02/17/19 118/76    Hypothyroidism:  Medication - 1mcg of synthroid daily.  Last TSH 09/04/2018 WNL- 1.38.   Denies palpitations, marked hair/skin/nail changes (noted no new concerns as h/o PCOS) Lab Results  Component Value Date   TSH 1.380 09/04/2018    Asthma:  Medications - albuterol as needed, not needed at all in recent past, singulair, Breo daily and noted doing well  Still noted some minimal congestion which is more chronic and has been out of her flonase and refill requested today.    Not taking antihistamine at this time.    GERD patient used to take prilosec 20mg  only prn - has not needed in past 6+ months No difficulty swallowing, abdominal pain, blood in stool, dark, black stool, no nausea/vomiting    Diabetes 2  Medications - metformin 1000mg  bid, not missing doses (off insulin since gastric bypass 2018) Not check blood sugars presently  No CP, SOB, urinary frequency, increased thirst, numbness or tingling in the extremities Last a1c - 6.2 in Oct 2020, 6.2 in June 2020, 6.1 a year ago, urine microalb neg in June Lab Results  Component  Value Date   HGBA1C 6.2 05/26/2019   HGBA1C 6.2 (H) 02/17/2019   HGBA1C 6.1 (H) 09/04/2018   Lab Results  Component Value Date   LDLCALC 92 02/17/2019   CREATININE 0.65 02/17/2019   Last foot exam Oct 2020 Last eye exam - due again in march 2021  Diet Trying to eat a healthy diet to keep weight controlled, limiting sugary/sweetened drinks Exercise - trying to be more active, walks and ride bikes about 1-2 times a week currently, encouraged more frequency to this    Obesity/S/p Gastric Bypass:  Heaviest weight 275lbs, lowest weight was 175lbs.  Current weight -  193, has been very stable this past year Wt Readings from Last 3 Encounters:  09/12/19 193 lb 12.8 oz (87.9 kg)  05/26/19 195 lb 9.6 oz (88.7 kg)  02/17/19 194 lb 9.6 oz (88.3 kg)    Sees Dr. Redmond Pulling for f/u at [facility] annually for B12/Vitamin D and other labs, missed annual due to Covid and will check Vit B12, magnesium, Vit D and ferritin with current labs as noted some nocturnal muscle cramping.    Hyperlipidemia:  Medication - now off statin, had been on statin in the past.  Last LDL 02/17/2019 and LDL was above goal of <70 at 92 (with profile much improved from one year ago) She did not want to return to statin therapy at that time nor presently..  Lab Results  Component  Value Date   CHOL 151 02/17/2019   HDL 42 02/17/2019   LDLCALC 92 02/17/2019   TRIG 84 02/17/2019   CHOLHDL 3.6 02/17/2019   Denies chest pain, shortness of breath.    PCOS:  patient is taking metformin. sees OBG[facility]- [facility].  No concerns.   Chronic Rhinitis: takes flonase PRN, Not taking antihistamine at this time.   H/o anemia: Had GI workup with scopes and no major concerns found per patient Last CBC in June - WNL Lab Results  Component Value Date   WBC 6.7 02/17/2019   HGB 13.4 02/17/2019   HCT 40.0 02/17/2019   MCV 89 02/17/2019   PLT 283 02/17/2019      Patient Active Problem List   Diagnosis Date Noted   . Hyperlipidemia due to type 2 diabetes mellitus ([place]) 05/26/2019  . Mild intermittent asthma without complication [id number]  . Bariatric surgery status 05/07/2018  . Overweight (BMI 25.0-29.9) 02/01/2018  . Essential hypertension 08/07/2017  . Bilateral chronic knee pain 08/07/2017  . Diverticulosis of large intestine without diverticulitis   . Iron deficiency anemia 08/15/2016  . Hx of microcytic hypochromic anemia 08/10/2016  . Hypothyroidism 07/03/2011  . High cholesterol 07/03/2011  . Type 2 diabetes mellitus ([place]) 07/03/2011  . GERD (gastroesophageal reflux disease) 07/03/2011  . PCOS (polycystic ovarian syndrome) 07/03/2011      Current Outpatient Medications:  .  albuterol (PROAIR HFA) 108 (90 Base) MCG/ACT inhaler, Inhale 2 puffs into the lungs every 4 (four) hours as needed for wheezing or shortness of breath., Disp: 1 Inhaler, Rfl: 0 .  calcium carbonate (TUMS - DOSED IN MG ELEMENTAL CALCIUM) 500 MG chewable tablet, Chew 1 tablet by mouth 3 (three) times daily. , Disp: , Rfl:  .  fluticasone (FLONASE) 50 MCG/ACT nasal spray, Place 1 spray into both nostrils daily. , Disp: , Rfl:  .  fluticasone furoate-vilanterol (BREO ELLIPTA) 100-25 MCG/INH AEPB, Inhale 1 puff into the lungs daily., Disp: 28 each, Rfl: 6 .  levothyroxine (SYNTHROID) 50 MCG tablet, TAKE 1 TABLET(50 MCG) BY MOUTH DAILY BEFORE BREAKFAST, Disp: 30 tablet, Rfl: 0 .  metFORMIN (GLUCOPHAGE) 1000 MG tablet, TAKE 1 TABLET(1000 MG) BY MOUTH TWICE DAILY WITH A MEAL, Disp: 180 tablet, Rfl: 0 .  montelukast (SINGULAIR) 10 MG tablet, TAKE 1 TABLET(10 MG) BY MOUTH AT BEDTIME, Disp: 90 tablet, Rfl: 1 .  Multiple Vitamin (MULTIVITAMIN) tablet, Take 1 tablet by mouth 2 (two) times daily. (bariatric advantage), Disp: , Rfl:  .  ondansetron (ZOFRAN) 4 MG tablet, Take 4 mg by mouth every 8 (eight) hours as needed. , Disp: , Rfl:  .  ONE TOUCH ULTRA TEST test strip, , Disp: , Rfl:  .  etonogestrel-ethinyl estradiol (NUVARING)  0.12-0.015 MG/24HR vaginal ring, Insert vaginally and leave in place for 3 consecutive weeks, then remove for 1 week. (Patient not taking: Reported on 09/12/2019), Disp: 1 each, Rfl: 0 .  omeprazole (PRILOSEC) 20 MG capsule, Take 20 mg by mouth as needed. , Disp: , Rfl:    Allergies  Allergen Reactions  . Nsaids Other (See Comments)    Patient had gastric bypass surgery so she cannot tolerate NSAIDs  . Adhesive [Tape] Itching    Skin peels off      Past Surgical History:  Procedure Laterality Date  . BILATERAL CARPAL TUNNEL RELEASE  2007  . CESAREAN SECTION  10/09/2010   FTP after IOL then had a PPH at [facility]  . CHOLECYSTECTOMY  1999  . COLONOSCOPY WITH PROPOFOL N/A  09/05/2016   Procedure: COLONOSCOPY WITH PROPOFOL;  Surgeon: Jonathon Bellows, MD;  Location: [facility];  Service: Endoscopy;  Laterality: N/A;. Diverticulosis  . DILATION AND CURETTAGE OF UTERUS  08/18/2003   endometrial polyps and hyperplasia  . ESOPHAGOGASTRODUODENOSCOPY (EGD) WITH PROPOFOL N/A 09/05/2016   Procedure: ESOPHAGOGASTRODUODENOSCOPY (EGD) WITH PROPOFOL;  Surgeon: Jonathon Bellows, MD;  Location: [facility] ENDOSCOPY;  Service: Endoscopy;  Laterality: N/A;  . 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 ROUX-EN-Y GASTRIC BYPASS  08/07/2017   Dr. Greer Pickerel  . SESMOIDECTOMY Left    fractured sesamoid bone removed from foot   FH - + heart disease noted in Mom, sister  Family History  Problem Relation Age of Onset  . Diabetes Mother   . Hypertension Mother   . Heart attack Mother 33       died from MI  . Heart disease Sister 14  . Cancer Brother 50       leukemia  . Stroke Maternal Grandmother   . Hypertension Maternal Grandmother   . Diabetes Paternal Grandmother   . Hypertension Paternal Grandmother   . Stroke Paternal Grandmother        mini strokes  . Ulcers  Maternal Grandfather   . Lymphoma Cousin   . Hypertension Maternal Uncle   . Kidney failure Maternal Uncle   . Thyroid disease Paternal Aunt   . Multiple sclerosis Paternal Aunt   . COPD Neg Hx   . Breast cancer Neg Hx    Former smoker, rare alcohol use  Social History   Socioeconomic History  . Marital status: Married    Spouse name: Grayland Ormond  . Number of children: 1  . Years of education: 7  . Highest education level: Not on file  Occupational History  . Occupation: office work  Tobacco Use  . Smoking status: Former Smoker    Packs/day: 1.50    Years: 20.00    Pack years: 30.00    Types: Cigarettes    Quit date: 09/10/2005    Years since quitting: 14.0  . Smokeless tobacco: Never Used  . Tobacco comment: quit 12 years ago   Substance and Sexual Activity  . Alcohol use: Yes    Comment: rarely  . Drug use: No  . Sexual activity: Yes    Partners: Male    Birth control/protection: Inserts    Comment: Nuvaring  Other Topics Concern  . Not on file  Social History Narrative  . Not on file   Social Determinants of Health   Financial Resource Strain: Low Risk   . Difficulty of Paying Living Expenses: Not hard at all  Food Insecurity: No Food Insecurity  . Worried About Charity fundraiser in the Last Year: Never true  . Ran Out of Food in the Last Year: Never true  Transportation Needs: No Transportation Needs  . Lack of Transportation (Medical): No  . Lack of Transportation (Non-Medical): No  Physical Activity: Inactive  . Days of Exercise per Week: 0 days  . Minutes of Exercise per Session: 0 min  Stress: Stress Concern Present  . Feeling of Stress : Rather much  Social Connections: Not Isolated  . Frequency of Communication with Friends and Family: More than three times a week  . Frequency of Social Gatherings with Friends and Family: More than three times a week  . Attends Religious Services:  1 to 4 times per year  . Active Member of Clubs or Organizations:  Yes  . Attends Archivist Meetings: More than 4 times per year  . Marital Status: Married  Human resources officer Violence: Not At Risk  . Fear of Current or Ex-Partner: No  . Emotionally Abused: No  . Physically Abused: No  . Sexually Abused: No    Patient works for [employer] and requests labs ordered thru them.  I personally reviewed active problem list, medication list, allergies, social history, health maintenance, notes from last encounter with the patient today.  ROS: As per HPI, otherwise no specific complaints on a limited and focused system review with her noting some nocturnal muscle cramping more recent past in feet mostly, not calves. Tries to massage areas and apply warmth to help and get up and walk about to help when occurs. Also noted is staying hydrated.      PHQ2/9: Depression screen [facility] 2/9 09/12/2019 05/26/2019 02/17/2019 12/30/2018 10/03/2018  Decreased Interest 0 0 0 0 0  Down, Depressed, Hopeless 0 0 0 0 0  PHQ - 2 Score 0 0 0 0 0  Altered sleeping 1 0 0 0 -  Tired, decreased energy 0 0 0 0 -  Change in appetite 0 0 0 0 -  Feeling bad or failure about yourself  0 0 0 0 -  Trouble concentrating 0 0 0 0 -  Moving slowly or fidgety/restless 0 0 0 0 -  Suicidal thoughts 0 0 0 0 -  PHQ-9 Score 1 0 0 0 -  Difficult doing work/chores Not difficult at all Not difficult at all Not difficult at all Not difficult at all -   PHQ-2/9 Result is neg   Fall Risk: Fall Risk  09/12/2019 05/26/2019 02/17/2019 12/30/2018 10/03/2018  Falls in the past year? 0 0 0 0 0  Number falls in past yr: 0 0 0 - 0  Injury with Fall? 0 0 0 - 0  Comment - - - - -  Follow up - Falls evaluation completed - - -      Objective:   Vitals:   09/12/19 0843  BP: 110/72  Pulse: 83  Resp: 16  Temp: (!) 96.8 F (36 C)  TempSrc: Temporal  SpO2: 98%  Weight: 193 lb 12.8 oz (87.9 kg)  Height: 5\' 7"  (1.702 m)    Body mass index is 30.35 kg/m.  Physical Exam    NAD, masked, pleasant  HEENT - sclera anicteric, PERRL, EOMI, conj - non-inj'ed, TM's and canals clear, pharynx clear Neck - supple, no adenopathy, no TM, carotids 2+ and = without bruits bilat Car - RRR without m/g/r Pulm- CTA without wheeze or rales Abd - soft, NT, ND, BS+, no obvious HSM, no masses Back - no CVA tenderness Ext - no LE edema, no active joints, no calf tenderness, distal pulses good including DP pulses in feet. Neuro - very pleasant, affect was not flat, appropriate with conversation  Grossly non-focal with good strength on testing, sensation intact to LT in distal extremities, DTR's 2+ and = patella,      Assessment & Plan:    1. Type 2 diabetes mellitus without complication, without long-term current use of insulin (HCC) - Hgb A1c w/o eAG - Comprehensive metabolic panel - Lipid panel Cont metformin presently and diet management as doing  2. Hypothyroidism due to acquired atrophy of thyroid - TSH Refilled thyroid med today  3. Essential hypertension Cont off of medications, BP good  today - Comprehensive metabolic panel  4. Hyperlipidemia due to type 2 diabetes mellitus ([place]) Cont off statin as patient not anxious to return to taking, await labs, diet management to continue and increasing physical activity rec'ed today - Comprehensive metabolic panel - Lipid panel  5. Hx of microcytic hypochromic anemia Last CBC was ok  - CBC with Differential/Platelet - Ferritin  6. Mild intermittent asthma without complication Cont medications, doing well  7. Gastroesophageal reflux disease without esophagitis Cont off PPI and doing well - CBC with Differential/Platelet  8. PCOS (polycystic ovarian syndrome) Cont f/u with ob/gyn  9. Class 1 obesity due to excess calories with serious comorbidity and body mass index (BMI) of 30.0 to 30.9 in adult Weights very stable past year, her goal is decrease about 15 pounds that she put on after staying home with Covid (working at home presently)  and she stated she knows how to do so.  Will f/u again with Dr. Redmond Pulling when Covid restrictions settle Labs ordered   10. Nocturnal muscle cramp Not RLS, more cramping and mostly in feet, she states not think is hydration related when discussed possible causes,  - Ferritin - VITAMIN D 25 Hydroxy (Vit-D Deficiency, Fractures) - Magnesium - B12 -TSH -CBC  11. History of bariatric surgery Will f/u again with Dr. Redmond Pulling when Covid restrictions settle - VITAMIN D 25 Hydroxy (Vit-D Deficiency, Fractures) - Magnesium - B12       CLIFFORD Park Breed, MD 09/12/19 8:56 AM

## 2019-09-12 ENCOUNTER — Other Ambulatory Visit: Payer: Self-pay

## 2019-09-12 ENCOUNTER — Ambulatory Visit: Payer: Managed Care, Other (non HMO) | Admitting: Internal Medicine

## 2019-09-12 ENCOUNTER — Encounter: Payer: Self-pay | Admitting: Internal Medicine

## 2019-09-12 VITALS — BP 110/72 | HR 83 | Temp 96.8°F | Resp 16 | Ht 67.0 in | Wt 193.8 lb

## 2019-09-12 DIAGNOSIS — E119 Type 2 diabetes mellitus without complications: Secondary | ICD-10-CM | POA: Diagnosis not present

## 2019-09-12 DIAGNOSIS — I10 Essential (primary) hypertension: Secondary | ICD-10-CM | POA: Diagnosis not present

## 2019-09-12 DIAGNOSIS — R252 Cramp and spasm: Secondary | ICD-10-CM

## 2019-09-12 DIAGNOSIS — J4520 Mild intermittent asthma, uncomplicated: Secondary | ICD-10-CM

## 2019-09-12 DIAGNOSIS — E034 Atrophy of thyroid (acquired): Secondary | ICD-10-CM | POA: Diagnosis not present

## 2019-09-12 DIAGNOSIS — E1169 Type 2 diabetes mellitus with other specified complication: Secondary | ICD-10-CM | POA: Diagnosis not present

## 2019-09-12 DIAGNOSIS — E6609 Other obesity due to excess calories: Secondary | ICD-10-CM

## 2019-09-12 DIAGNOSIS — Z6830 Body mass index (BMI) 30.0-30.9, adult: Secondary | ICD-10-CM

## 2019-09-12 DIAGNOSIS — K219 Gastro-esophageal reflux disease without esophagitis: Secondary | ICD-10-CM

## 2019-09-12 DIAGNOSIS — E282 Polycystic ovarian syndrome: Secondary | ICD-10-CM

## 2019-09-12 DIAGNOSIS — Z862 Personal history of diseases of the blood and blood-forming organs and certain disorders involving the immune mechanism: Secondary | ICD-10-CM

## 2019-09-12 DIAGNOSIS — Z9884 Bariatric surgery status: Secondary | ICD-10-CM

## 2019-09-12 DIAGNOSIS — E785 Hyperlipidemia, unspecified: Secondary | ICD-10-CM

## 2019-09-12 MED ORDER — ONDANSETRON HCL 4 MG PO TABS: 4.0000 mg | ORAL_TABLET | Freq: Three times a day (TID) | ORAL | 1 refills | Status: DC | PRN

## 2019-09-12 MED ORDER — FLUTICASONE PROPIONATE 50 MCG/ACT NA SUSP: 2.0000 | Freq: Every day | NASAL | 3 refills | Status: DC

## 2019-09-12 MED ORDER — LEVOTHYROXINE SODIUM 50 MCG PO TABS: ORAL_TABLET | ORAL | 3 refills | Status: DC

## 2019-09-13 LAB — COMPREHENSIVE METABOLIC PANEL
ALT: 21 IU/L (ref 0–32)
AST: 16 IU/L (ref 0–40)
Albumin/Globulin Ratio: 2 (ref 1.2–2.2)
Albumin: 4.5 g/dL (ref 3.8–4.8)
Alkaline Phosphatase: 63 IU/L (ref 39–117)
BUN/Creatinine Ratio: 12 (ref 9–23)
BUN: 6 mg/dL (ref 6–24)
Bilirubin Total: 0.3 mg/dL (ref 0.0–1.2)
CO2: 25 mmol/L (ref 20–29)
Calcium: 10.1 mg/dL (ref 8.7–10.2)
Chloride: 102 mmol/L (ref 96–106)
Creatinine, Ser: 0.51 mg/dL — ABNORMAL LOW (ref 0.57–1.00)
GFR calc Af Amer: 131 mL/min/{1.73_m2} (ref >59)
GFR calc non Af Amer: 114 mL/min/{1.73_m2} (ref >59)
Globulin, Total: 2.2 g/dL (ref 1.5–4.5)
Glucose: 106 mg/dL — ABNORMAL HIGH (ref 65–99)
Potassium: 5.1 mmol/L (ref 3.5–5.2)
Sodium: 141 mmol/L (ref 134–144)
Total Protein: 6.7 g/dL (ref 6.0–8.5)

## 2019-09-13 LAB — CBC WITH DIFFERENTIAL/PLATELET
Basophils Absolute: 0.1 10*3/uL (ref 0.0–0.2)
Basos: 1 %
EOS (ABSOLUTE): 0.2 10*3/uL (ref 0.0–0.4)
Eos: 4 %
Hematocrit: 41.1 % (ref 34.0–46.6)
Hemoglobin: 14.1 g/dL (ref 11.1–15.9)
Immature Grans (Abs): 0 10*3/uL (ref 0.0–0.1)
Immature Granulocytes: 0 %
Lymphocytes Absolute: 2.3 10*3/uL (ref 0.7–3.1)
Lymphs: 38 %
MCH: 29 pg (ref 26.6–33.0)
MCHC: 34.3 g/dL (ref 31.5–35.7)
MCV: 85 fL (ref 79–97)
Monocytes Absolute: 0.4 10*3/uL (ref 0.1–0.9)
Monocytes: 7 %
Neutrophils Absolute: 3.1 10*3/uL (ref 1.4–7.0)
Neutrophils: 50 %
Platelets: 286 10*3/uL (ref 150–450)
RBC: 4.86 x10E6/uL (ref 3.77–5.28)
RDW: 12.6 % (ref 11.7–15.4)
WBC: 6.1 10*3/uL (ref 3.4–10.8)

## 2019-09-13 LAB — LIPID PANEL
Chol/HDL Ratio: 4.1 ratio (ref 0.0–4.4)
Cholesterol, Total: 195 mg/dL (ref 100–199)
HDL: 48 mg/dL (ref >39)
LDL Chol Calc (NIH): 118 mg/dL — ABNORMAL HIGH (ref 0–99)
Triglycerides: 164 mg/dL — ABNORMAL HIGH (ref 0–149)
VLDL Cholesterol Cal: 29 mg/dL (ref 5–40)

## 2019-09-13 LAB — VITAMIN B12: Vitamin B-12: >2000 pg/mL — ABNORMAL HIGH (ref 232–1245)

## 2019-09-13 LAB — MAGNESIUM: Magnesium: 2 mg/dL (ref 1.6–2.3)

## 2019-09-13 LAB — TSH: TSH: 1.54 u[IU]/mL (ref 0.450–4.500)

## 2019-09-13 LAB — VITAMIN D 25 HYDROXY (VIT D DEFICIENCY, FRACTURES): Vit D, 25-Hydroxy: 50.9 ng/mL (ref 30.0–100.0)

## 2019-09-13 LAB — FERRITIN: Ferritin: 32 ng/mL (ref 15–150)

## 2019-09-13 NOTE — Progress Notes (Signed)
Hello Amber Stanley I am writing to let you know that I have reviewed your results: All in all, the results are good.  The glucose was just slightly above normal range (106), and is improved from the last reading 6 months ago. Still awaiting the Hgb A1C to help ensure sugars are well controlled.  The remainder of the comprehensive panel was all good, with the slightly low creatine not a concern. The lipid panel showed the TG's slightly high and the LDL cholesterol elevated (118), with the goal LDL < 70. A medication (in the statin class) can help lower this, and you were on this in the past and stopped more recently and you wanted to hold off on taking again. Continuing diet modifications to help presently is important if not resume taking the statin and some more information is included at the end of this message on this topic to help. The CBC was normal with no evidence of anemia. The TSH was good, continue the current dose of your thyroid supplement. And your Vitamin B and D levels, iron stores (ferritin lab), and magnesium were all good, with no deficiency concerns.  I expect the one lab still pending (A1C) to be ok and will release that result when it returns.  It was nice meeting you yesterday and keep up the good work with diet and weight maintenance, and also try to increase your physical activity levels some as we discussed (hope helped with warmer weather soon and a predicted decrease in this most recent Covid surge).  Stay safe, Dr. Roxan Hockey  The LDL is the "lousy" or bad cholesterol. Over time and in combination with inflammation and other factors, this contributes to plaque which in turn may lead to stroke and/or heart attack down the road.  Sometimes high LDL is primarily genetic, and people might be eating all the right foods but still have high numbers.  Other times, there is room for improvement in one's diet and eating healthier can bring this number down and potentially reduce one's  risk of heart attack and/or stroke.  Your LDL level should be below 100. If you have diabetes or a possible heart problem, your LDL should be below 70.  Some strategies to focus on to help improve your LDL levels:  - Eat 20 to 30 grams of fiber every day.  - Eat Foods such as fruits and vegetables, whole grains, beans, peas, nuts, and seeds can help lower LDL. - Avoid Saturated fats - Dairy foods - such as butter, cream, ghee, regular-fat milk and cheese. Meat - such as fatty cuts of beef, pork and lamb, processed meats like salami, sausages and the skin on chicken. Lard., fatty snack foods, cakes, biscuits, pies and deep fried foods)

## 2019-09-16 ENCOUNTER — Ambulatory Visit: Payer: Managed Care, Other (non HMO) | Admitting: Certified Nurse Midwife

## 2019-09-16 NOTE — Progress Notes (Signed)
The Hgb A1C was 6.6, minimally increased from prior checks. Will continue to monitor.   Dr. Roxan Hockey

## 2019-09-17 LAB — HGB A1C W/O EAG: Hgb A1c MFr Bld: 6.6 % — ABNORMAL HIGH (ref 4.8–5.6)

## 2019-09-17 LAB — SPECIMEN STATUS REPORT

## 2019-10-09 ENCOUNTER — Ambulatory Visit: Payer: Managed Care, Other (non HMO) | Admitting: Certified Nurse Midwife

## 2019-10-30 NOTE — Progress Notes (Signed)
Gynecology Annual Exam  PCP: Hubbard Hartshorn, FNP  Chief Complaint:  Chief Complaint  Patient presents with  . Gynecologic Exam    hoping she's at the cusp of menopause - test thru [facility]    History of Present Illness:Amber Stanley is a 49 year old Caucasian/White female, G1 P1001, who presents for her annual gyn exam.   Her menses became regular last year on the Nuvaring and using an estrogen patch helped stop the BTB. She stopped the Nuvaring in November. She had a menses in December, then another in February (10/12/2019). Her menses are now lasting 4-5 days with a medium flow and are with stringy clots. No hot flashes during the day, but does have night sweats.   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 She has not needed insulin since her surgery and her most recent hemoglobin A1C was 6.6% on her Metformin. . She denies any nephropathy, neuropathy or retinopathy from the DM. Her past medical history is also remarkable for asthma, GERD, and hypothyroidism.  Since her last annual GYN exam dated 05/15/2018 , she has gained 15# and her current BMI is 30.38 kg/m2.  She is sexually active. She is currently using nothing for contraception. Is aware of risks of pregnancy.  Her most recent pap smear was obtained 05/15/2018 and was NIL/negative HRHPV. Her most recent mammogram was obtained this year 03/26/2018 and was negative.  She had a colonoscopy and EGD 09/05/2016 for anemia work up. Results: diverticulosis  There is no family history of breast cancer.  There is no family history of ovarian cancer.  The patient does do monthly self breast exams.  The patient does not smoke. Former smoker.  The patient does rarely drink alcohol.  The patient does not use illegal drugs.  The patient does not exercise. The patient does get adequate calcium in her diet and with her bariatric  vitamins She had a recent cholesterol screen in 2021 that was normal except for a slightly elevated triglycerides and LDL was 118.   The patient denies current symptoms of depression.    Review of Systems: Review of Systems  Constitutional: Negative for chills, fever and weight loss.  HENT: Negative for congestion, sinus pain and sore throat.   Eyes: Negative for blurred vision and pain.  Respiratory: Negative for hemoptysis, shortness of breath and wheezing.   Cardiovascular: Negative for chest pain, palpitations and leg swelling.  Gastrointestinal: Negative for abdominal pain, blood in stool, diarrhea, heartburn, nausea and vomiting.  Genitourinary: Negative for dysuria, frequency, hematuria and urgency.       Positive for irregular bleeding  Musculoskeletal: Negative for back pain, joint pain and myalgias.  Skin: Negative for itching and rash.  Neurological: Negative for dizziness, tingling and headaches.  Endo/Heme/Allergies: Negative for environmental allergies and polydipsia. Does not bruise/bleed easily.  Psychiatric/Behavioral: Negative for depression. The patient is not nervous/anxious and does not have insomnia.     Past Medical History:  Past Medical History:  Diagnosis Date  . Anemia   . Asthma   . Diabetes mellitus without complication ([place])   . Diverticulosis 08/2016  . First degree hemorrhoids   . GERD (gastroesophageal reflux disease)   . Heart murmur   . Hypercholesterolemia 07/03/2011  . Hypertension   . Hypothyroidism   . Morbid obesity ([place]) 08/26/2015  . Pneumonia  06/2017  . Polycystic ovaries   . Reflux   . Thyroid disease     Past Surgical History:  Past Surgical History:  Procedure Laterality Date  . BILATERAL CARPAL TUNNEL RELEASE  2007  . CESAREAN SECTION  10/09/2010   FTP after IOL then had a PPH at [facility]  . CHOLECYSTECTOMY  1999  . COLONOSCOPY WITH PROPOFOL N/A 09/05/2016   Procedure: COLONOSCOPY WITH PROPOFOL;  Surgeon: Jonathon Bellows, MD;   Location: [facility] ENDOSCOPY;  Service: Endoscopy;  Laterality: N/A;. Diverticulosis  . DILATION AND CURETTAGE OF UTERUS  08/18/2003   endometrial polyps and hyperplasia  . ESOPHAGOGASTRODUODENOSCOPY (EGD) WITH PROPOFOL N/A 09/05/2016   Procedure: ESOPHAGOGASTRODUODENOSCOPY (EGD) WITH PROPOFOL;  Surgeon: Jonathon Bellows, MD;  Location: [facility] ENDOSCOPY;  Service: Endoscopy;  Laterality: N/A;  . 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 ROUX-EN-Y GASTRIC BYPASS  08/07/2017   Dr. Greer Pickerel  . SESMOIDECTOMY Left    fractured sesamoid bone removed from foot    Family History:  Family History  Problem Relation Age of Onset  . Diabetes Mother   . Hypertension Mother   . Heart attack Mother 39       died from MI  . Heart disease Sister 46  . Cancer Brother 27       leukemia  . Stroke Maternal Grandmother   . Hypertension Maternal Grandmother   . Diabetes Paternal Grandmother   . Hypertension Paternal Grandmother   . Stroke Paternal Grandmother        mini strokes  . Ulcers Maternal Grandfather   . Lymphoma Cousin   . Hypertension Maternal Uncle   . Kidney failure Maternal Uncle   . Thyroid disease Paternal Aunt   . Multiple sclerosis Paternal Aunt   . COPD Neg Hx   . Breast cancer Neg Hx     Social History:  Social History   Socioeconomic History  . Marital status: Married    Spouse name: Grayland Ormond  . Number of children: 1  . Years of education: 28  . Highest education level: Not on file  Occupational History  . Occupation: office work  Tobacco Use  . Smoking status: Former Smoker    Packs/day: 1.50    Years: 20.00    Pack years: 30.00    Types: Cigarettes    Quit date: 09/10/2005    Years since quitting: 14.1  . Smokeless tobacco: Never Used  . Tobacco comment: quit 12 years ago   Substance and Sexual Activity  . Alcohol use:  Yes    Comment: rarely  . Drug use: No  . Sexual activity: Yes    Partners: Male    Birth control/protection: None    Comment: Nuvaring  Other Topics Concern  . Not on file  Social History Narrative  . Not on file   Social Determinants of Health   Financial Resource Strain:   . Difficulty of Paying Living Expenses:   Food Insecurity:   . Worried About Charity fundraiser in the Last Year:   . Arboriculturist in the Last Year:   Transportation Needs:   . Film/video editor (Medical):   Marland Kitchen Lack of Transportation (Non-Medical):   Physical Activity:   . Days of Exercise per Week:   . Minutes of Exercise per Session:   Stress:   . Feeling of Stress :  Social Connections:   . Frequency of Communication with Friends and Family:   . Frequency of Social Gatherings with Friends and Family:   . Attends Religious Services:   . Active Member of Clubs or Organizations:   . Attends Archivist Meetings:   Marland Kitchen Marital Status:   Intimate Partner Violence:   . Fear of Current or Ex-Partner:   . Emotionally Abused:   Marland Kitchen Physically Abused:   . Sexually Abused:     Allergies:  Allergies  Allergen Reactions  . Nsaids Other (See Comments)    Patient had gastric bypass surgery so she cannot tolerate NSAIDs  . Adhesive [Tape] Itching    Skin peels off     Medications: Current Outpatient Medications on File Prior to Visit  Medication Sig Dispense Refill  . albuterol (PROAIR HFA) 108 (90 Base) MCG/ACT inhaler Inhale 2 puffs into the lungs every 4 (four) hours as needed for wheezing or shortness of breath. 1 Inhaler 0  . calcium carbonate (TUMS - DOSED IN MG ELEMENTAL CALCIUM) 500 MG chewable tablet Chew 1 tablet by mouth 3 (three) times daily.     . fluticasone (FLONASE) 50 MCG/ACT nasal spray Place 2 sprays into both nostrils daily. 16 g 3  . fluticasone furoate-vilanterol (BREO ELLIPTA) 100-25 MCG/INH AEPB Inhale 1 puff into the lungs daily. 28 each 6  . levothyroxine  (SYNTHROID) 50 MCG tablet TAKE 1 TABLET(50 MCG) BY MOUTH DAILY BEFORE BREAKFAST 90 tablet 3  . metFORMIN (GLUCOPHAGE) 1000 MG tablet TAKE 1 TABLET(1000 MG) BY MOUTH TWICE DAILY WITH A MEAL 180 tablet 0  . montelukast (SINGULAIR) 10 MG tablet TAKE 1 TABLET(10 MG) BY MOUTH AT BEDTIME 90 tablet 1  . Multiple Vitamin (MULTIVITAMIN) tablet Take 1 tablet by mouth 2 (two) times daily. (bariatric advantage)    . ondansetron (ZOFRAN) 4 MG tablet Take 1 tablet (4 mg total) by mouth every 8 (eight) hours as needed. 20 tablet 1  .      . omeprazole (PRILOSEC) 20 MG capsule Take 20 mg by mouth as needed.     . ONE TOUCH ULTRA TEST test strip      No current facility-administered medications on file prior to visit.   Physical Exam Vitals:BP 120/80   Pulse 72   Temp (!) 97.5 F (36.4 C)   Ht 5' 7.5" (1.715 m)   Wt 197 lb (89.4 kg)   LMP 10/12/2019 (Exact Date)   BMI 30.40 kg/m   General: WF in NAD HEENT: normocephalic, anicteric Neck: no thyroid enlargement, no palpable nodules, no cervical lymphadenopathy  Pulmonary: No increased work of breathing, CTAB Cardiovascular: RRR, without murmur  Breast: Breast symmetrical, no tenderness, no palpable nodules or masses, no skin or nipple retraction present, no nipple discharge.  No axillary, infraclavicular or supraclavicular lymphadenopathy. Abdomen: Soft, non-tender, non-distended.  Umbilicus without lesions.  No hepatomegaly or masses palpable. No evidence of hernia. Port scars on abd x 3 from Roux en Y surgury Genitourinary:  External: Normal external female genitalia. Normal urethral meatus, normal Bartholin's and Skene's glands.    Vagina: Normal vaginal mucosa, no evidence of prolapse.    Cervix: Grossly normal in appearance, no bleeding, non-tender  Uterus: Anteverted and deviated to left, normal size, shape, and consistency, mobile, and non-tender  Adnexa: No adnexal masses, non-tender  Rectal: deferred  Lymphatic: no evidence of inguinal  lymphadenopathy Extremities: no edema, erythema, or tenderness Neurologic: Grossly intact Psychiatric: mood appropriate, affect full     Assessment/ Plan: 49 y.o.  G1P1001 annual gyn exam Irregular bleeding/ night sweats -may be perimenopausal    1) Breast cancer screening - recommend monthly self breast exam and annual screening mammograms. Mammogram ordered-Patient to schedule  2) Colon cancer screening-done 08/2016  3) Cervical cancer screening - Pap was done. ASCCP guidelines and rational discussed.  Patient opts for yearly screening interval  4) Contraception - none. Continue on vitamins. Aware  Of increased risks of miscarriage, chromosomal abnormalities, worsening diabetes and risks to her health  etc with a pregnancy  5) Routine healthcare maintenance including cholesterol  screening managed by PCP   6) RTO in 1 year and prn for annual. Anticipatory guidance regarding menopause. Advised to report menses <21 days apart or lasting >10 days, or heavy bleeding.  Dalia Heading, CNM

## 2019-10-31 ENCOUNTER — Ambulatory Visit (INDEPENDENT_AMBULATORY_CARE_PROVIDER_SITE_OTHER): Payer: Managed Care, Other (non HMO) | Admitting: Certified Nurse Midwife

## 2019-10-31 ENCOUNTER — Encounter: Payer: Self-pay | Admitting: Certified Nurse Midwife

## 2019-10-31 ENCOUNTER — Other Ambulatory Visit: Payer: Self-pay

## 2019-10-31 VITALS — BP 120/80 | HR 72 | Temp 97.5°F | Ht 67.5 in | Wt 197.0 lb

## 2019-10-31 DIAGNOSIS — R61 Generalized hyperhidrosis: Secondary | ICD-10-CM

## 2019-10-31 DIAGNOSIS — Z1239 Encounter for other screening for malignant neoplasm of breast: Secondary | ICD-10-CM

## 2019-10-31 DIAGNOSIS — N938 Other specified abnormal uterine and vaginal bleeding: Secondary | ICD-10-CM

## 2019-10-31 DIAGNOSIS — Z01411 Encounter for gynecological examination (general) (routine) with abnormal findings: Secondary | ICD-10-CM

## 2019-10-31 DIAGNOSIS — Z124 Encounter for screening for malignant neoplasm of cervix: Secondary | ICD-10-CM

## 2019-11-04 LAB — IGP,RFX APTIMA HPV ALL PTH

## 2019-11-13 ENCOUNTER — Other Ambulatory Visit: Payer: Self-pay | Admitting: Family Medicine

## 2019-11-13 DIAGNOSIS — E119 Type 2 diabetes mellitus without complications: Secondary | ICD-10-CM

## 2019-11-13 NOTE — Telephone Encounter (Signed)
Requested Prescriptions  Pending Prescriptions Disp Refills  . metFORMIN (GLUCOPHAGE) 1000 MG tablet [Pharmacy Med Name: METFORMIN 1000MG TABLETS] 180 tablet 0    Sig: TAKE 1 TABLET(1000 MG) BY MOUTH TWICE DAILY WITH A MEAL     Endocrinology:  Diabetes - Biguanides Failed - 11/13/2019 10:03 AM      Failed - Cr in normal range and within 360 days    Creatinine, Ser  Date Value Ref Range Status  09/12/2019 0.51 (L) 0.57 - 1.00 mg/dL Final         Passed - HBA1C is between 0 and 7.9 and within 180 days    Hemoglobin A1C  Date Value Ref Range Status  03/27/2018 5.9  Final   HbA1c, POC (prediabetic range)  Date Value Ref Range Status  05/26/2019 6.2 5.7 - 6.4 % Final   Hgb A1c MFr Bld  Date Value Ref Range Status  09/12/2019 6.6 (H) 4.8 - 5.6 % Final    Comment:             Prediabetes: 5.7 - 6.4          Diabetes: >6.4          Glycemic control for adults with diabetes: <7.0          Passed - eGFR in normal range and within 360 days    GFR calc Af Amer  Date Value Ref Range Status  09/12/2019 131 >59 mL/min/1.73 Final   GFR calc non Af Amer  Date Value Ref Range Status  09/12/2019 114 >59 mL/min/1.73 Final         Passed - Valid encounter within last 6 months    Recent Outpatient Visits          2 months ago Type 2 diabetes mellitus without complication, without long-term current use of insulin [facility])   [facility] [place] Towanda Malkin, MD   5 months ago Essential hypertension   [place], [place], FNP   8 months ago Preventative health care   [place], NP   10 months ago Acute non-recurrent maxillary sinusitis   [place], [place], NP   1 year ago Essential hypertension   [place], [place], NP      Future Appointments            In 3 months Towanda Malkin, MD [facility],  [facility]

## 2019-11-25 ENCOUNTER — Other Ambulatory Visit: Payer: Self-pay | Admitting: Internal Medicine

## 2019-11-25 NOTE — Telephone Encounter (Signed)
This is not a [place] [facility] patient. Her PCP is Raelyn Ensign, FNP of [facility].  AMD

## 2019-12-03 ENCOUNTER — Telehealth: Payer: Self-pay | Admitting: Family Medicine

## 2019-12-03 DIAGNOSIS — J4520 Mild intermittent asthma, uncomplicated: Secondary | ICD-10-CM

## 2019-12-03 NOTE — Telephone Encounter (Signed)
fluticasone furoate-vilanterol (BREO ELLIPTA) 100-25 MCG/INH AEPB  Pt has requested refill is almost out and needs refill, if this is out of stock / Pt would settle for the generic of Advair.  [facility] [id number], [place] AT [address] Phone:  [phone]  Fax:  [phone]     Pls FU if there is any issues

## 2019-12-04 MED ORDER — BREO ELLIPTA 100-25 MCG/INH IN AEPB: 1.0000 | INHALATION_SPRAY | Freq: Every day | RESPIRATORY_TRACT | 6 refills | Status: DC

## 2019-12-04 NOTE — Telephone Encounter (Signed)
Breo Ellipta refilled.

## 2020-01-11 ENCOUNTER — Other Ambulatory Visit: Payer: Self-pay | Admitting: Internal Medicine

## 2020-01-12 ENCOUNTER — Other Ambulatory Visit: Payer: Self-pay | Admitting: Family Medicine

## 2020-01-28 ENCOUNTER — Other Ambulatory Visit: Payer: Self-pay

## 2020-01-28 DIAGNOSIS — J4520 Mild intermittent asthma, uncomplicated: Secondary | ICD-10-CM

## 2020-01-28 LAB — HM DIABETES EYE EXAM

## 2020-01-28 MED ORDER — MONTELUKAST SODIUM 10 MG PO TABS: ORAL_TABLET | ORAL | 1 refills | Status: DC

## 2020-02-02 ENCOUNTER — Telehealth: Payer: Self-pay

## 2020-02-02 NOTE — Telephone Encounter (Signed)
Copied from [place] [id number]. Topic: General - Other >> Feb 02, 2020 11:13 AM Marya Landry D wrote: Reason for CRM: Patient ci stating that she would prefer to get her lab work done prior to her appt on 03/11/2020.please advise

## 2020-02-02 NOTE — Telephone Encounter (Signed)
We did a very thorough lab evaluation in January, and likely not need to repeat many labs on the f/u visit in July. Will need an A1C and can do that in the office and any others pending if felt needed after clinical evaluation. I hope that is helpful. [facility]

## 2020-02-03 NOTE — Telephone Encounter (Signed)
Patient notified

## 2020-02-09 ENCOUNTER — Other Ambulatory Visit: Payer: Self-pay

## 2020-02-09 DIAGNOSIS — E119 Type 2 diabetes mellitus without complications: Secondary | ICD-10-CM

## 2020-02-09 DIAGNOSIS — Z794 Long term (current) use of insulin: Secondary | ICD-10-CM

## 2020-02-10 MED ORDER — METFORMIN HCL 1000 MG PO TABS: ORAL_TABLET | ORAL | 1 refills | Status: DC

## 2020-03-05 ENCOUNTER — Encounter (HOSPITAL_COMMUNITY): Payer: Self-pay

## 2020-03-11 ENCOUNTER — Ambulatory Visit: Payer: Managed Care, Other (non HMO) | Admitting: Internal Medicine

## 2020-03-11 NOTE — Progress Notes (Signed)
Patient ID: Amber Stanley, female    DOB: [birth year], 49 y.o.   MRN: [id number]  PCP: Hubbard Hartshorn, FNP  Chief Complaint  Patient presents with  . 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 female, presents to clinic with CC of the following:  Chief Complaint  Patient presents with  . 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  The remainder of the comprehensive panel was all good, with the slightly low creatine not a concern. The lipid panel showed the TG's slightly high and the LDL cholesterol elevated (118), with the goal LDL < 70. A medication (in the statin class) can help lower this, and you were on this in the past and stopped more recently and you wanted to hold off on taking again. Continuing diet modifications to help presently is important if not resume taking the statin and some more information is included at the end of this message on this topic to help. The CBC was normal with no evidence of anemia. The TSH was good, continue the current dose of your thyroid supplement. And your Vitamin B and D levels, iron stores (ferritin lab), and magnesium were all good, with no deficiency concerns.  The A1c was 6.6, minimally increased from the prior visit. Follows up today.  Daughter is with her for this visit  She had her annual GYN exam in March 2021 with the assessment/plan as follows:  Assessment/ Plan: 49 y.o. G1P1001 annual gyn exam Irregular bleeding/ night sweats -may be perimenopausal 1) Breast cancer screening - recommend monthly self breast exam and annual screening mammograms. Mammogram ordered-Patient  to schedule and did not have done to date.   2) Colon cancer screening-done 08/2016  3) Cervical cancer screening - Pap was done. ASCCP guidelines and rational discussed.  Patient opts for yearly screening interval  4) Contraception - none. Continue on vitamins. Aware  Of increased risks of miscarriage, chromosomal abnormalities, worsening diabetes and risks to her health  etc with a pregnancy  5) Routine healthcare maintenance including cholesterol  screening managed by PCP   6) RTO in 1 year and prn for annual. Anticipatory guidance regarding menopause. Advised to report menses <21 days apart or lasting >10 days, or heavy bleeding.   Hypertension:. Is no longer taking BP meds, off after losing weight successfully Diet to manage - trying to eat healthy to help keep weight controlled, doing well with this Denies CP, palpitations, SOB, increased HA's, increased LE swelling, vision changes BP Readings from Last 3 Encounters:  03/12/20 120/82  10/31/19 120/80  09/12/19 110/72    Hypothyroidism:  Medication - 63mcg of synthroid daily.  Denies palpitations, marked hair/skin/nail changes (noted no new concerns as h/o PCOS) Lab Results  Component Value Date   TSH 1.540 09/12/2019   Asthma:  Medications - albuterol as needed, not needed at all in recent past, singulair, Breo daily and noted doing well   GERD patient used to take prilosec 20mg only prn - has not needed in past 6+ months No  difficulty swallowing, abdominal pain, blood in stool, dark or black stool, no nausea/vomiting  Diabetes 2  Medications - metformin 1000mg  bid, not missing doses (off insulin since gastric bypass 2018) Not check blood sugars presently  No urinary frequency, increased thirst, numbness or tingling in the extremities Lab Results  Component Value Date   HGBA1C 6.6 (H) 09/12/2019   HGBA1C 6.2 05/26/2019   HGBA1C 6.2 (H) 02/17/2019   Lab Results  Component Value Date   LDLCALC 118 (H)  09/12/2019   CREATININE 0.51 (L) 09/12/2019    Last eye exam - 01/30/20, no retinopathy concerns  Diet Trying to eat a healthy diet to keep weight controlled Exercise - was trying to be more active, failing at that now, no longer walking or biking,  encouraged more frequency to this again.    Obesity/S/p Gastric Bypass:  Heaviest weight 275lbs, lowest weight was 175lbs.  .  Recent weight remains stable Wt Readings from Last 3 Encounters:  03/12/20 196 lb 14.4 oz (89.3 kg)  10/31/19 197 lb (89.4 kg)  09/12/19 193 lb 12.8 oz (87.9 kg)    Sees Dr. Redmond Pulling for f/u at [facility] annually for B12/Vitamin D and other labs, missed last annual due to Covid. Has appt next Friday for f/u.  Hyperlipidemia:  Medication - now off statin, had been on statin in the past. Not opposed to restarting.   Lab Results  Component Value Date   CHOL 195 09/12/2019   HDL 48 09/12/2019   LDLCALC 118 (H) 09/12/2019   TRIG 164 (H) 09/12/2019   CHOLHDL 4.1 09/12/2019    PCOS:  patient is taking metformin.sees OBGYN- [facility]. No recent concerns.  Chronic Rhinitis: takes flonase PRN, Not taking antihistamine at this time.   H/o anemia: Had GI workup with scopes and no major concerns found per patient Most recent CBC good. Lab Results  Component Value Date   WBC 6.1 09/12/2019   HGB 14.1 09/12/2019   HCT 41.1 09/12/2019   MCV 85 09/12/2019   PLT 286 09/12/2019     Patient Active Problem List   Diagnosis Date Noted  . 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 Mild intermittent asthma without complication 67/67/2094  . Bariatric surgery status 05/07/2018  . Overweight (BMI 25.0-29.9) 02/01/2018  . Essential hypertension 08/07/2017  . Bilateral chronic knee pain 08/07/2017  . Diverticulosis of large intestine without diverticulitis   . Iron deficiency anemia 08/15/2016  . Hx of microcytic hypochromic anemia 08/10/2016  . Class 1 obesity due to  excess calories with serious comorbidity and body mass index (BMI) of 30.0 to 30.9 in adult 08/10/2016  . Hypothyroidism 07/03/2011  . High cholesterol 07/03/2011  . Type 2 diabetes mellitus ([place]) 07/03/2011  . GERD (gastroesophageal reflux disease) 07/03/2011  . PCOS (polycystic ovarian syndrome) 07/03/2011      Current Outpatient Medications:  .  albuterol (PROAIR HFA) 108 (90 Base) MCG/ACT inhaler, Inhale 2 puffs into the lungs every 4 (four) hours as needed for wheezing or shortness of breath., Disp: 1 Inhaler, Rfl: 0 .  calcium carbonate (TUMS - DOSED IN MG ELEMENTAL CALCIUM) 500 MG chewable tablet, Chew 1 tablet by mouth 3 (three) times daily. , Disp: , Rfl:  .  fluticasone (FLONASE) 50 MCG/ACT nasal spray, SHAKE LIQUID AND USE 2 SPRAYS IN EACH NOSTRIL DAILY, Disp: 48 g, Rfl: 0 .  fluticasone furoate-vilanterol (BREO ELLIPTA) 100-25 MCG/INH AEPB, Inhale 1 puff into the lungs daily., Disp: 28 each, Rfl: 6 .  levothyroxine (SYNTHROID) 50 MCG tablet, TAKE 1 TABLET(50 MCG) BY MOUTH DAILY BEFORE BREAKFAST, Disp: 90 tablet, Rfl: 3 .  metFORMIN (GLUCOPHAGE) 1000 MG tablet, TAKE 1 TABLET(1000 MG) BY MOUTH TWICE DAILY WITH A MEAL, Disp: 180 tablet, Rfl: 1 .  montelukast (SINGULAIR) 10 MG tablet, TAKE 1 TABLET(10 MG) BY MOUTH AT BEDTIME, Disp: 90 tablet, Rfl: 1 .  Multiple Vitamin (MULTIVITAMIN) tablet, Take 1 tablet by mouth 2 (two) times daily. (bariatric advantage), Disp: , Rfl:  .  ondansetron (ZOFRAN) 4 MG tablet, TAKE 1 TABLET(4 MG) BY MOUTH EVERY 8 HOURS AS NEEDED, Disp: 20 tablet, Rfl: 1 .  omeprazole (PRILOSEC) 20 MG capsule, Take 20 mg by mouth as needed.  (Patient not taking: Reported on 03/12/2020), Disp: , Rfl:    Allergies  Allergen Reactions  . Nsaids Other (See Comments)    Patient had gastric bypass surgery so she cannot tolerate NSAIDs  . Adhesive [Tape] Itching    Skin peels off      Past Surgical History:  Procedure Laterality Date  . BILATERAL CARPAL TUNNEL RELEASE   2007  . CESAREAN SECTION  10/09/2010   FTP after IOL then had a P[facility] at [facility]  . CHOLECYSTECTOMY  1999  . COLONOSCOPY WITH PROPOFOL N/A 09/05/2016   Procedure: COLONOSCOPY WITH PROPOFOL;  Surgeon: Jonathon Bellows, MD;  Location: [facility] ENDOSCOPY;  Service: Endoscopy;  Laterality: N/A;. Diverticulosis  . DILATION AND CURETTAGE OF UTERUS  08/18/2003   endometrial polyps and hyperplasia  . ESOPHAGOGASTRODUODENOSCOPY (EGD) WITH PROPOFOL N/A 09/05/2016   Procedure: ESOPHAGOGASTRODUODENOSCOPY (EGD) WITH PROPOFOL;  Surgeon: Jonathon Bellows, MD;  Location: [facility] ENDOSCOPY;  Service: Endoscopy;  Laterality: N/A;  . 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 ROUX-EN-Y GASTRIC BYPASS  08/07/2017   Dr. Greer Pickerel  . SESMOIDECTOMY Left    fractured sesamoid bone removed from foot     Family History  Problem Relation Age of Onset  . Diabetes Mother   . Hypertension Mother   . Heart attack Mother 16       died from MI  . Heart disease Sister 47  . Cancer Brother 69       leukemia  . Stroke Maternal Grandmother   . Hypertension Maternal Grandmother   . Diabetes Paternal Grandmother   . Hypertension Paternal Grandmother   . Stroke Paternal Grandmother        mini strokes  . Ulcers Maternal Grandfather   . Lymphoma Cousin   . Hypertension Maternal Uncle   . Kidney failure Maternal Uncle   . Thyroid disease Paternal Aunt   . Multiple sclerosis Paternal Aunt   . COPD Neg Hx   . Breast cancer Neg Hx      Social History   Tobacco Use  . Smoking status: Former Smoker    Packs/day: 1.50    Years: 20.00    Pack years: 30.00    Types: Cigarettes    Quit date: 09/10/2005    Years since quitting: 14.5  . Smokeless tobacco: Never Used  . Tobacco comment: quit 12 years ago   Substance Use Topics  . Alcohol use: Yes    Comment: rarely    With staff  assistance, above reviewed with the patient today.  ROS: As per HPI, otherwise no specific complaints on a limited and focused system review   No results found for this or  any previous visit (from the past 72 hour(s)).   PHQ2/9: Depression screen [facility] 2/9 03/12/2020 09/12/2019 05/26/2019 02/17/2019 12/30/2018  Decreased Interest 0 0 0 0 0  Down, Depressed, Hopeless 0 0 0 0 0  PHQ - 2 Score 0 0 0 0 0  Altered sleeping 0 1 0 0 0  Tired, decreased energy 0 0 0 0 0  Change in appetite 0 0 0 0 0  Feeling bad or failure about yourself  0 0 0 0 0  Trouble concentrating 0 0 0 0 0  Moving slowly or fidgety/restless 0 0 0 0 0  Suicidal thoughts 0 0 0 0 0  PHQ-9 Score 0 1 0 0 0  Difficult doing work/chores Not difficult at all Not difficult at all Not difficult at all Not difficult at all Not difficult at all  Some recent data might be hidden   PHQ-2/9 Result is neg  Fall Risk: Fall Risk  03/12/2020 09/12/2019 05/26/2019 02/17/2019 12/30/2018  Falls in the past year? 0 0 0 0 0  Number falls in past yr: 0 0 0 0 -  Injury with Fall? 0 0 0 0 -  Comment - - - - -  Follow up - - Falls evaluation completed - -      Objective:   Vitals:   03/12/20 0833  BP: 120/82  Pulse: 73  Resp: 16  Temp: 97.8 F (36.6 C)  TempSrc: Temporal  SpO2: 100%  Weight: 196 lb 14.4 oz (89.3 kg)  Height: 5\' 8"  (1.727 m)    Body mass index is 29.94 kg/m.  Physical Exam    NAD, masked, pleasant HEENT - sclera anicteric, PERRL, EOMI, conj - non-inj'ed,  pharynx clear Neck - supple, no adenopathy, no TM, carotids 2+ and = without bruits bilat Car - RRR without m/g/r Pulm- CTA without wheeze or rales Abd - soft, NT diffusely, ND,  Back - no CVA tenderness Ext - no LE edema,  Neuro - very pleasant, affect was not flat, appropriate with conversation             Grossly non-focal with good strength on testing, sensation intact to LT in distal extremities,  Speech normal   Results for orders placed or  performed in visit on 01/30/20  HM DIABETES EYE EXAM  Result Value Ref Range   HM Diabetic Eye Exam No Retinopathy No Retinopathy   Last labs reviewed. She noted she had a recent hemoglobin A1c for work and it was 6.4. An A1c obtained in the office today was 6.4.    Assessment & Plan:   1. Type 2 diabetes mellitus without complication, without long-term current use of insulin ([place]) Notes on fairly recent check, her A1c was 6.4 through work. Will recheck A1c in the office today, and was done and was 6.4. Continues on Metformin, both for PCOS and helps with her diabetes management. Also will check a urine for microalbumin, and she will do that through [place].  (I was informed do not have strips in the office to check in the office today) - POCT glycosylated hemoglobin (Hb A1C)  2. Essential hypertension Blood pressure remains well controlled off of any medications. Continue to monitor  3. Hyperlipidemia due to type 2 diabetes mellitus ([place]) Reviewed the last lipid panel, and her goal of an LDL less than 70 given she is diabetic. Discussed the benefits of the statin, and she did agree to restart the statin today. We will start Lipitor 10 mg daily, take  at nighttime recommended Dietary modifications and trying to get more physically active also strongly encouraged  - atorvastatin (LIPITOR) 10 MG tablet; Take 1 tablet (10 mg total) by mouth daily.  Dispense: 90 tablet; Refill: 3  4. Hypothyroidism due to acquired atrophy of thyroid Last TSH check was good, Continue the thyroid supplement.  5. Mild intermittent asthma without complication Has remained very stable in the recent past.  6. Gastroesophageal reflux disease without esophagitis Continues off the PPI, and has done well to date.  7. History of bariatric surgery She has a follow-up planned again next week with the surgeon. Has maintained a fairly stable weight in the recent past. She had a form to complete from work noting  that her BMI was above 30, and the form was completed noting that she joined weight watchers, and her BMI on this visit was less than 30 (it was just below 30 at 29.9)  8. Overweight As above, her BMI was just below 30 on this visit and continuing to monitor. She notes she wants to get nutrition involved again and likely will do so on her follow-up with the surgeon next week.  9. PCOS (polycystic ovarian syndrome) Has been doing well, has been followed with OB/GYN to help as well.  She has not yet scheduled the mammogram as was recommended by OB/GYN on her follow-up visit in March, and she stated today she would do so and strongly encouraged.  We will follow-up again in 6 months time, with her diabetes having been relatively stable and well controlled, and follow-up sooner as needed.  She had a form to complete for scouts camp,  and that was done today as well with a copy made for the chart.     Towanda Malkin, MD 03/12/20 8:44 AM

## 2020-03-12 ENCOUNTER — Encounter: Payer: Self-pay | Admitting: Internal Medicine

## 2020-03-12 ENCOUNTER — Ambulatory Visit: Payer: Managed Care, Other (non HMO) | Admitting: Internal Medicine

## 2020-03-12 ENCOUNTER — Other Ambulatory Visit: Payer: Self-pay

## 2020-03-12 VITALS — BP 120/82 | HR 73 | Temp 97.8°F | Resp 16 | Ht 68.0 in | Wt 196.9 lb

## 2020-03-12 DIAGNOSIS — E1169 Type 2 diabetes mellitus with other specified complication: Secondary | ICD-10-CM

## 2020-03-12 DIAGNOSIS — E663 Overweight: Secondary | ICD-10-CM

## 2020-03-12 DIAGNOSIS — I10 Essential (primary) hypertension: Secondary | ICD-10-CM | POA: Diagnosis not present

## 2020-03-12 DIAGNOSIS — E119 Type 2 diabetes mellitus without complications: Secondary | ICD-10-CM

## 2020-03-12 DIAGNOSIS — E785 Hyperlipidemia, unspecified: Secondary | ICD-10-CM

## 2020-03-12 DIAGNOSIS — J4520 Mild intermittent asthma, uncomplicated: Secondary | ICD-10-CM

## 2020-03-12 DIAGNOSIS — E034 Atrophy of thyroid (acquired): Secondary | ICD-10-CM

## 2020-03-12 DIAGNOSIS — K219 Gastro-esophageal reflux disease without esophagitis: Secondary | ICD-10-CM

## 2020-03-12 DIAGNOSIS — Z9884 Bariatric surgery status: Secondary | ICD-10-CM

## 2020-03-12 DIAGNOSIS — E282 Polycystic ovarian syndrome: Secondary | ICD-10-CM

## 2020-03-12 LAB — POCT GLYCOSYLATED HEMOGLOBIN (HGB A1C): Hemoglobin A1C: 6.4 % — AB (ref 4.0–5.6)

## 2020-03-12 MED ORDER — ATORVASTATIN CALCIUM 10 MG PO TABS: 10.0000 mg | ORAL_TABLET | Freq: Every day | ORAL | 3 refills | Status: AC

## 2020-03-12 NOTE — Patient Instructions (Signed)
Please schedule your mammogram!

## 2020-03-13 LAB — MICROALBUMIN, URINE: Microalbumin, Urine: 3.5 ug/mL

## 2020-05-31 ENCOUNTER — Other Ambulatory Visit: Payer: Self-pay

## 2020-05-31 DIAGNOSIS — J4520 Mild intermittent asthma, uncomplicated: Secondary | ICD-10-CM

## 2020-05-31 MED ORDER — BREO ELLIPTA 100-25 MCG/INH IN AEPB: 1.0000 | INHALATION_SPRAY | Freq: Every day | RESPIRATORY_TRACT | 1 refills | Status: DC

## 2020-05-31 NOTE — Telephone Encounter (Signed)
Pt insurance requiring 90 day supply

## 2020-06-15 ENCOUNTER — Other Ambulatory Visit: Payer: Self-pay | Admitting: Internal Medicine

## 2020-06-16 ENCOUNTER — Other Ambulatory Visit: Payer: Self-pay

## 2020-06-16 DIAGNOSIS — J4520 Mild intermittent asthma, uncomplicated: Secondary | ICD-10-CM

## 2020-06-16 MED ORDER — BREO ELLIPTA 100-25 MCG/INH IN AEPB: 1.0000 | INHALATION_SPRAY | Freq: Every day | RESPIRATORY_TRACT | 1 refills | Status: DC

## 2020-06-16 NOTE — Telephone Encounter (Signed)
Copied from [place] [id number]. Topic: Quick Communication - Rx Refill/Question >> Jun 16, 2020 10:43 AM Hinda Lenis D wrote: Medication: PT need a 3 months supple so her insurance will cover / please advise  fluticasone furoate-vilanterol (BREO ELLIPTA) 100-25 MCG/INH AEPB [[id number]]  Has the patient contacted their pharmacy? {yes  (Agent: If no, request that the patient contact the pharmacy for the refill.) yes  (Agent: If yes, when and what did the pharmacy advise?)  Preferred Pharmacy (with phone number or street name):  [id number] [address], [place] - [place] [address] Phone: [phone] Fax: [phone]    Agent: Please be advised that RX refills may take up to 3 business days. We ask that you follow-up with your pharmacy.

## 2020-06-22 ENCOUNTER — Other Ambulatory Visit: Payer: Self-pay | Admitting: Internal Medicine

## 2020-06-22 DIAGNOSIS — J4520 Mild intermittent asthma, uncomplicated: Secondary | ICD-10-CM

## 2020-06-22 MED ORDER — BREO ELLIPTA 100-25 MCG/INH IN AEPB: 1.0000 | INHALATION_SPRAY | Freq: Every day | RESPIRATORY_TRACT | 0 refills | Status: AC

## 2020-07-24 ENCOUNTER — Other Ambulatory Visit: Payer: Self-pay | Admitting: Internal Medicine

## 2020-07-24 DIAGNOSIS — J4520 Mild intermittent asthma, uncomplicated: Secondary | ICD-10-CM

## 2020-08-03 ENCOUNTER — Other Ambulatory Visit: Payer: Self-pay | Admitting: Internal Medicine

## 2020-08-03 DIAGNOSIS — E119 Type 2 diabetes mellitus without complications: Secondary | ICD-10-CM

## 2020-09-03 NOTE — Progress Notes (Deleted)
Patient is a 50 year old female Last visit with me was 03/12/2020 Follows up today Daughter is with her for this visit  She had her annual GYN exam in March 2021 with the assessment/plan as follows:             Assessment/ Plan:50 y.o.G1P1001 annual gyn exam Irregular bleeding/ night sweats -may be perimenopausal 1) Breast cancer screening - recommend monthly self breast exam and annual screening mammograms. Mammogram ordered-Patient to schedule and did not have done to date.   2) Colon cancer screening-done 08/2016  3) Cervical cancer screening - Pap was done. ASCCP guidelines and rational discussed. Patient opts for yearlyscreening interval  4) Contraception -none. Continue on vitamins. Aware Of increased risks of miscarriage, chromosomal abnormalities, worsening diabetes and risks to her health etc with a pregnancy  5) Routine healthcare maintenance including cholesterol screening managed by PCP  6) RTO in1 year and prn for annual. Anticipatory guidance regarding menopause. Advised to report menses <21 days apart or lasting >10 days, or heavy bleeding.   Hypertension:. Is no longer taking BP meds, off after losing weight successfully Diet to manage - trying to eat healthy to help keep weight controlled,doing well with this Denies CP, palpitations, SOB, increased HA's, increased LE swelling, vision changes BP Readings from Last 3 Encounters:  03/12/20 120/82  10/31/19 120/80  09/12/19 110/72     Hypothyroidism:  Medication - 55mcg of synthroid daily.  Denies palpitations,markedhair/skin/nail changes(noted nonew concerns as h/oPCOS) Lab Results  Component Value Date   TSH 1.540 09/12/2019    Asthma:  Medications - albuterol as needed,not needed at all in recent past,singulair, Breo daily and noteddoing well  GERD patientused to takeprilosec 20mg onlyprn- has not neededin past 6+ months No difficulty swallowing, abdominal pain, blood in  stool, dark or black stool, no nausea/vomiting  Diabetes 2 Medications -metformin 1000mg  bid, not missing doses (off insulin since gastric bypass 2018) Not check blood sugars presently  No urinary frequency, increased thirst, numbness or tingling in the extremities Lab Results  Component Value Date   HGBA1C 6.4 (A) 03/12/2020   HGBA1C 6.6 (H) 09/12/2019   HGBA1C 6.2 05/26/2019   Lab Results  Component Value Date   LDLCALC 118 (H) 09/12/2019   CREATININE 0.51 (L) 09/12/2019    Urine microalbumin checked last visit, Last eye exam - 01/30/20, no retinopathy concerns    Obesity/S/p Gastric Bypass:  Heaviest weight 275lbs, lowest weight was 175lbs.  .  Recent weight remains stable Wt Readings from Last 3 Encounters:  03/12/20 196 lb 14.4 oz (89.3 kg)  10/31/19 197 lb (89.4 kg)  09/12/19 193 lb 12.8 oz (87.9 kg)    Diet Trying to eat a healthy diet to keep weight controlled Exercise - was trying to be more active, failing at that now, no longer walking or biking,  encouraged more frequency to this again.   Sees Dr. Redmond Pulling for f/u at [facility] annually for B12/Vitamin D and other labs  Hyperlipidemia:  Medication - Lipitor 10 mg daily, started after the below labs obtained  Lab Results  Component Value Date   CHOL 195 09/12/2019   HDL 48 09/12/2019   LDLCALC 118 (H) 09/12/2019   TRIG 164 (H) 09/12/2019   CHOLHDL 4.1 09/12/2019     PCOS:  patient is taking metformin.sees OBGYN- [facility]. No recent concerns.  ChronicRhinitis: takes flonase PRN, Not taking antihistamine at this time.  H/o anemia:Had GI workup with scopes and no major concerns found per patient Most recent CBC  good. Lab Results  Component Value Date   WBC 6.1 09/12/2019   HGB 14.1 09/12/2019   HCT 41.1 09/12/2019   MCV 85 09/12/2019   PLT 286 09/12/2019   NAD,masked, pleasant HEENT - sclera anicteric, PERRL, EOMI, conj - non-inj'ed,  pharynx clear Neck - supple,  no adenopathy, no TM, carotids 2+ and = without bruits bilat Car - RRR without m/g/r Pulm- CTA without wheeze or rales Abd - soft, NT diffusely, ND,  Back - no CVA tenderness Ext - no LE edema,  Neuro -very pleasant,affect was not flat, appropriate with conversation Grossly non-focal with good strength on testing, sensation intact to LT in distal extremities,             Speech normal   1. Type 2 diabetes mellitus without complication, without long-term current use of insulin ([place]) Notes on fairly recent check, her A1c was 6.4 through work. Will recheck A1c in the office today, and was done and was 6.4. Continues on Metformin, both for PCOS and helps with her diabetes management. Also will check a urine for microalbumin, and she will do that through [place].  (I was informed do not have strips in the office to check in the office today) - POCT glycosylated hemoglobin (Hb A1C)  2. Essential hypertension Blood pressure remains well controlled off of any medications. Continue to monitor  3. Hyperlipidemia due to type 2 diabetes mellitus ([place]) Reviewed the last lipid panel, and her goal of an LDL less than 70 given she is diabetic. Discussed the benefits of the statin, and she did agree to restart the statin today. We will start Lipitor 10 mg daily, take at nighttime recommended Dietary modifications and trying to get more physically active also strongly encouraged  - atorvastatin (LIPITOR) 10 MG tablet; Take 1 tablet (10 mg total) by mouth daily.  Dispense: 90 tablet; Refill: 3  4. Hypothyroidism due to acquired atrophy of thyroid Last TSH check was good, Continue the thyroid supplement.  5. Mild intermittent asthma without complication Has remained very stable in the recent past.  6. Gastroesophageal reflux disease without esophagitis Continues off the PPI, and has done well to date.  7. History of bariatric surgery She has a follow-up planned again next  week with the surgeon. Has maintained a fairly stable weight in the recent past. She had a form to complete from work noting that her BMI was above 30, and the form was completed noting that she joined weight watchers, and her BMI on this visit was less than 30 (it was just below 30 at 29.9)  8. Overweight As above, her BMI was just below 30 on this visit and continuing to monitor. She notes she wants to get nutrition involved again and likely will do so on her follow-up with the surgeon next week.  9. PCOS (polycystic ovarian syndrome) Has been doing well, has been followed with OB/GYN to help as well.  She has not yet scheduled the mammogram as was recommended by OB/GYN on her follow-up visit in March, strongly encouraged to do so her last visit, although not yet obtained.  F/u 6 months

## 2020-09-07 ENCOUNTER — Ambulatory Visit: Payer: Managed Care, Other (non HMO) | Admitting: Internal Medicine

## 2020-09-14 ENCOUNTER — Ambulatory Visit: Payer: Managed Care, Other (non HMO) | Admitting: Internal Medicine

## 2020-10-06 ENCOUNTER — Other Ambulatory Visit: Payer: Self-pay | Admitting: Family Medicine

## 2020-10-06 DIAGNOSIS — Z1231 Encounter for screening mammogram for malignant neoplasm of breast: Secondary | ICD-10-CM

## 2020-10-19 ENCOUNTER — Encounter: Payer: Self-pay | Admitting: Internal Medicine

## 2020-10-21 ENCOUNTER — Ambulatory Visit
Admission: RE | Admit: 2020-10-21 | Discharge: 2020-10-21 | Disposition: A | Discharge status: Home / Self Care | Payer: Managed Care, Other (non HMO) | Source: Ambulatory Visit | Attending: Family Medicine | Admitting: Family Medicine

## 2020-10-21 ENCOUNTER — Other Ambulatory Visit: Payer: Self-pay

## 2020-10-21 DIAGNOSIS — Z1231 Encounter for screening mammogram for malignant neoplasm of breast: Secondary | ICD-10-CM | POA: Diagnosis not present

## 2020-11-03 ENCOUNTER — Ambulatory Visit: Payer: Managed Care, Other (non HMO) | Admitting: Advanced Practice Midwife

## 2020-11-09 ENCOUNTER — Ambulatory Visit: Payer: Managed Care, Other (non HMO) | Admitting: Advanced Practice Midwife

## 2020-11-18 ENCOUNTER — Ambulatory Visit (INDEPENDENT_AMBULATORY_CARE_PROVIDER_SITE_OTHER): Payer: Managed Care, Other (non HMO) | Admitting: Advanced Practice Midwife

## 2020-11-18 ENCOUNTER — Encounter: Payer: Self-pay | Admitting: Advanced Practice Midwife

## 2020-11-18 ENCOUNTER — Other Ambulatory Visit: Payer: Self-pay

## 2020-11-18 VITALS — BP 126/84 | Ht 68.0 in | Wt 198.0 lb

## 2020-11-18 DIAGNOSIS — Z01419 Encounter for gynecological examination (general) (routine) without abnormal findings: Secondary | ICD-10-CM | POA: Diagnosis not present

## 2020-11-18 DIAGNOSIS — Z124 Encounter for screening for malignant neoplasm of cervix: Secondary | ICD-10-CM | POA: Diagnosis not present

## 2020-11-18 NOTE — Patient Instructions (Signed)
American Heart Association (AHA) Exercise Recommendation  Being physically active is important to prevent heart disease and stroke, the nation's No. 1and No. 5killers. To improve overall cardiovascular health, we suggest at least 150 minutes per week of moderate exercise or 75 minutes per week of vigorous exercise (or a combination of moderate and vigorous activity). Thirty minutes a day, five times a week is an easy goal to remember. You will also experience benefits even if you divide your time into two or three segments of 10 to 15 minutes per day.  For people who would benefit from lowering their blood pressure or cholesterol, we recommend 40 minutes of aerobic exercise of moderate to vigorous intensity three to four times a week to lower the risk for heart attack and stroke.  Physical activity is anything that makes you move your body and burn calories.  This includes things like climbing stairs or playing sports. Aerobic exercises benefit your heart, and include walking, jogging, swimming or biking. Strength and stretching exercises are best for overall stamina and flexibility.  The simplest, positive change you can make to effectively improve your heart health is to start walking. It's enjoyable, free, easy, social and great exercise. A walking program is flexible and boasts high success rates because people can stick with it. It's easy for walking to become a regular and satisfying part of life.   For Overall Cardiovascular Health:  At least 30 minutes of moderate-intensity aerobic activity at least 5 days per week for a total of 150  OR   At least 25 minutes of vigorous aerobic activity at least 3 days per week for a total of 75 minutes; or a combination of moderate- and vigorous-intensity aerobic activity  AND   Moderate- to high-intensity muscle-strengthening activity at least 2 days per week for additional health benefits.  For Lowering Blood Pressure and Cholesterol  An  average 40 minutes of moderate- to vigorous-intensity aerobic activity 3 or 4 times per week  What if I can't make it to the time goal? Something is always better than nothing! And everyone has to start somewhere. Even if you've been sedentary for years, today is the day you can begin to make healthy changes in your life. If you don't think you'll make it for 30 or 40 minutes, set a reachable goal for today. You can work up toward your overall goal by increasing your time as you get stronger. Don't let all-or-nothing thinking rob you of doing what you can every day.  Source:http://www.heart.org   Health Maintenance, Female Adopting a healthy lifestyle and getting preventive care are important in promoting health and wellness. Ask your health care provider about:  The right schedule for you to have regular tests and exams.  Things you can do on your own to prevent diseases and keep yourself healthy. What should I know about diet, weight, and exercise? Eat a healthy diet  Eat a diet that includes plenty of vegetables, fruits, low-fat dairy products, and lean protein.  Do not eat a lot of foods that are high in solid fats, added sugars, or sodium.   Maintain a healthy weight Body mass index (BMI) is used to identify weight problems. It estimates body fat based on height and weight. Your health care provider can help determine your BMI and help you achieve or maintain a healthy weight. Get regular exercise Get regular exercise. This is one of the most important things you can do for your health. Most adults should:  Exercise for  at least 150 minutes each week. The exercise should increase your heart rate and make you sweat (moderate-intensity exercise).  Do strengthening exercises at least twice a week. This is in addition to the moderate-intensity exercise.  Spend less time sitting. Even light physical activity can be beneficial. Watch cholesterol and blood lipids Have your blood tested  for lipids and cholesterol at 50 years of age, then have this test every 5 years. Have your cholesterol levels checked more often if:  Your lipid or cholesterol levels are high.  You are older than 50 years of age.  You are at high risk for heart disease. What should I know about cancer screening? Depending on your health history and family history, you may need to have cancer screening at various ages. This may include screening for:  Breast cancer.  Cervical cancer.  Colorectal cancer.  Skin cancer.  Lung cancer. What should I know about heart disease, diabetes, and high blood pressure? Blood pressure and heart disease  High blood pressure causes heart disease and increases the risk of stroke. This is more likely to develop in people who have high blood pressure readings, are of African descent, or are overweight.  Have your blood pressure checked: ? Every 3-5 years if you are 49-23 years of age. ? Every year if you are 50 years old or older. Diabetes Have regular diabetes screenings. This checks your fasting blood sugar level. Have the screening done:  Once every three years after age 41 if you are at a normal weight and have a low risk for diabetes.  More often and at a younger age if you are overweight or have a high risk for diabetes. What should I know about preventing infection? Hepatitis B If you have a higher risk for hepatitis B, you should be screened for this virus. Talk with your health care provider to find out if you are at risk for hepatitis B infection. Hepatitis C Testing is recommended for:  Everyone born from [birth year] through 1965.  Anyone with known risk factors for hepatitis C. Sexually transmitted infections (STIs)  Get screened for STIs, including gonorrhea and chlamydia, if: ? You are sexually active and are younger than 50 years of age. ? You are older than 50 years of age and your health care provider tells you that you are at risk for this type  of infection. ? Your sexual activity has changed since you were last screened, and you are at increased risk for chlamydia or gonorrhea. Ask your health care provider if you are at risk.  Ask your health care provider about whether you are at high risk for HIV. Your health care provider may recommend a prescription medicine to help prevent HIV infection. If you choose to take medicine to prevent HIV, you should first get tested for HIV. You should then be tested every 3 months for as long as you are taking the medicine. Pregnancy  If you are about to stop having your period (premenopausal) and you may become pregnant, seek counseling before you get pregnant.  Take 400 to 800 micrograms (mcg) of folic acid every day if you become pregnant.  Ask for birth control (contraception) if you want to prevent pregnancy. Osteoporosis and menopause Osteoporosis is a disease in which the bones lose minerals and strength with aging. This can result in bone fractures. If you are 25 years old or older, or if you are at risk for osteoporosis and fractures, ask your health care provider if you  should:  Be screened for bone loss.  Take a calcium or vitamin D supplement to lower your risk of fractures.  Be given hormone replacement therapy (HRT) to treat symptoms of menopause. Follow these instructions at home: Lifestyle  Do not use any products that contain nicotine or tobacco, such as cigarettes, e-cigarettes, and chewing tobacco. If you need help quitting, ask your health care provider.  Do not use street drugs.  Do not share needles.  Ask your health care provider for help if you need support or information about quitting drugs. Alcohol use  Do not drink alcohol if: ? Your health care provider tells you not to drink. ? You are pregnant, may be pregnant, or are planning to become pregnant.  If you drink alcohol: ? Limit how much you use to 0-1 drink a day. ? Limit intake if you are  breastfeeding.  Be aware of how much alcohol is in your drink. In the [place] one drink equals one 12 oz bottle of beer (355 mL), one 5 oz glass of wine (148 mL), or one 1 oz glass of hard liquor (44 mL). General instructions  Schedule regular health, dental, and eye exams.  Stay current with your vaccines.  Tell your health care provider if: ? You often feel depressed. ? You have ever been abused or do not feel safe at home. Summary  Adopting a healthy lifestyle and getting preventive care are important in promoting health and wellness.  Follow your health care provider's instructions about healthy diet, exercising, and getting tested or screened for diseases.  Follow your health care provider's instructions on monitoring your cholesterol and blood pressure. This information is not intended to replace advice given to you by your health care provider. Make sure you discuss any questions you have with your health care provider. Document Revised: 07/31/2018 Document Reviewed: 07/31/2018 Elsevier Patient Education  2021 [employer].

## 2020-11-18 NOTE — Progress Notes (Addendum)
Gynecology Annual Exam  PCP: Bunnie Pion, FNP  Chief Complaint:  Chief Complaint  Patient presents with  . Annual Exam    History of Present Illness: Patient is a 50 y.o. G1P1001 presents for annual exam. The patient has no gyn complaints today. In the past 2 years she has missed 1 period per year. She had a normal mammogram 3 weeks ago. Her PCP manages labs and medications.   LMP: Patient's last menstrual period was 11/14/2020. Average Interval: regular, 28 days Duration of flow: 4 days Heavy Menses: no Clots: no Intermenstrual Bleeding: no Postcoital Bleeding: no Dysmenorrhea: no   The patient is sexually active. She currently uses none for contraception. She denies dyspareunia.  The patient does perform self breast exams.  There is no notable family history of breast or ovarian cancer in her family.  The patient wears seatbelts: yes.   The patient has regular exercise: she has limited exercise- mainly walking around inside the house, she admits a healthy diet, she drinks only unsweetened decaf tea, she usually has 7 hours of sleep.    The patient denies current symptoms of depression.    Review of Systems: Review of Systems  Constitutional: Negative for chills and fever.  HENT: Negative for congestion, ear discharge, ear pain, hearing loss, sinus pain and sore throat.   Eyes: Negative for blurred vision and double vision.  Respiratory: Negative for cough, shortness of breath and wheezing.   Cardiovascular: Negative for chest pain, palpitations and leg swelling.  Gastrointestinal: Negative for abdominal pain, blood in stool, constipation, diarrhea, heartburn, melena, nausea and vomiting.  Genitourinary: Negative for dysuria, flank pain, frequency, hematuria and urgency.       Positive for strong smell and bright color in urine (she thinks due to MV)  Musculoskeletal: Negative for back pain, joint pain and myalgias.  Skin: Negative for itching and rash.   Neurological: Negative for dizziness, tingling, tremors, sensory change, speech change, focal weakness, seizures, loss of consciousness, weakness and headaches.  Endo/Heme/Allergies: Negative for environmental allergies. Does not bruise/bleed easily.  Psychiatric/Behavioral: Negative for depression, hallucinations, memory loss, substance abuse and suicidal ideas. The patient is not nervous/anxious and does not have insomnia.     Past Medical History:  Patient Active Problem List   Diagnosis Date Noted  . Hyperlipidemia due to type 2 diabetes mellitus ([place]) 05/26/2019  . Mild intermittent asthma without complication 71/69/6789  . Bariatric surgery status 05/07/2018  . Overweight (BMI 25.0-29.9) 02/01/2018  . Essential hypertension 08/07/2017  . Diverticulosis of large intestine without diverticulitis   . Iron deficiency anemia 08/15/2016  . Hx of microcytic hypochromic anemia 08/10/2016  . Class 1 obesity due to excess calories with serious comorbidity and body mass index (BMI) of 30.0 to 30.9 in adult 08/10/2016  . Hypothyroidism 07/03/2011    Overview:  Diagnosed 2011  Overview:  Diagnosed 2011   . High cholesterol 07/03/2011  . Type 2 diabetes mellitus ([place]) 07/03/2011  . PCOS (polycystic ovarian syndrome) 07/03/2011    Past Surgical History:  Past Surgical History:  Procedure Laterality Date  . BILATERAL CARPAL TUNNEL RELEASE  2007  . CESAREAN SECTION  10/09/2010   FTP after IOL then had a [facility] at [facility]  . CHOLECYSTECTOMY  1999  . COLONOSCOPY WITH PROPOFOL N/A 09/05/2016   Procedure: COLONOSCOPY WITH PROPOFOL;  Surgeon: Jonathon Bellows, MD;  Location: [facility] ENDOSCOPY;  Service: Endoscopy;  Laterality: N/A;. Diverticulosis  . DILATION AND CURETTAGE OF UTERUS  08/18/2003   endometrial  polyps and hyperplasia  . ESOPHAGOGASTRODUODENOSCOPY (EGD) WITH PROPOFOL N/A 09/05/2016   Procedure: ESOPHAGOGASTRODUODENOSCOPY (EGD) WITH PROPOFOL;  Surgeon: Jonathon Bellows, MD;  Location: [facility] ENDOSCOPY;   Service: Endoscopy;  Laterality: N/A;  . 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 ROUX-EN-Y GASTRIC BYPASS  08/07/2017   Dr. Greer Pickerel  . SESMOIDECTOMY Left    fractured sesamoid bone removed from foot    Gynecologic History:  Patient's last menstrual period was 11/14/2020. Contraception: none Last Pap: 2021 Results were:  no abnormalities  Last mammogram: 3 weeks ago Results were: BI-RAD I  Obstetric History: G1P1001  Family History:  Family History  Problem Relation Age of Onset  . Diabetes Mother   . Hypertension Mother   . Heart attack Mother 66       died from MI  . Heart disease Sister 45  . Cancer Brother 49       leukemia  . Stroke Maternal Grandmother   . Hypertension Maternal Grandmother   . Diabetes Paternal Grandmother   . Hypertension Paternal Grandmother   . Stroke Paternal Grandmother        mini strokes  . Ulcers Maternal Grandfather   . Lymphoma Cousin   . Hypertension Maternal Uncle   . Kidney failure Maternal Uncle   . Thyroid disease Paternal Aunt   . Multiple sclerosis Paternal Aunt   . COPD Neg Hx   . Breast cancer Neg Hx     Social History:  Social History   Socioeconomic History  . Marital status: Married    Spouse name: Grayland Ormond  . Number of children: 1  . Years of education: 84  . Highest education level: Not on file  Occupational History  . Occupation: office work  Tobacco Use  . Smoking status: Former Smoker    Packs/day: 1.50    Years: 20.00    Pack years: 30.00    Types: Cigarettes    Quit date: 09/10/2005    Years since quitting: 15.2  . Smokeless tobacco: Never Used  . Tobacco comment: quit 12 years ago   Vaping Use  . Vaping Use: Never used  Substance and Sexual Activity  . Alcohol use: Yes    Comment: rarely  . Drug use: No  . Sexual activity: Yes    Partners: Male     Birth control/protection: None    Comment: Nuvaring  Other Topics Concern  . Not on file  Social History Narrative  . Not on file   Social Determinants of Health   Financial Resource Strain: Not on file  Food Insecurity: Not on file  Transportation Needs: Not on file  Physical Activity: Not on file  Stress: Not on file  Social Connections: Not on file  Intimate Partner Violence: Not on file    Allergies:  Allergies  Allergen Reactions  . Nsaids Other (See Comments)    Patient had gastric bypass surgery so she cannot tolerate NSAIDs  . Adhesive [Tape] Itching    Skin peels off     Medications: Prior to Admission medications   Medication Sig Start Date End Date Taking? Authorizing Provider  albuterol (PROAIR HFA) 108 (90 Base) MCG/ACT inhaler Inhale 2 puffs into the lungs every 4 (four) hours as needed for wheezing or shortness of breath. 06/05/17  Yes Hubbard Hartshorn, FNP  atorvastatin (LIPITOR) 10 MG tablet Take 1 tablet (  10 mg total) by mouth daily. 03/12/20  Yes Towanda Malkin, MD  calcium carbonate (TUMS - DOSED IN MG ELEMENTAL CALCIUM) 500 MG chewable tablet Chew 1 tablet by mouth 3 (three) times daily.   Yes [provider]  fluticasone (FLONASE) 50 MCG/ACT nasal spray SHAKE LIQUID AND USE 2 SPRAYS IN EACH NOSTRIL DAILY 01/12/20  Yes Sowles, Drue Stager, MD  levothyroxine (SYNTHROID) 50 MCG tablet TAKE 1 TABLET(50 MCG) BY MOUTH DAILY BEFORE BREAKFAST 06/15/20  Yes Lebron Conners D, MD  metFORMIN (GLUCOPHAGE) 1000 MG tablet TAKE 1 TABLET(1000 MG) BY MOUTH TWICE DAILY WITH A MEAL 08/03/20  Yes Lebron Conners D, MD  montelukast (SINGULAIR) 10 MG tablet TAKE 1 TABLET(10 MG) BY MOUTH AT BEDTIME 07/26/20  Yes Towanda Malkin, MD  Multiple Vitamin (MULTIVITAMIN) tablet Take 1 tablet by mouth 2 (two) times daily. (bariatric advantage) 08/20/17  Yes Lada, Satira Anis, MD  ondansetron (ZOFRAN) 4 MG tablet TAKE 1 TABLET(4 MG) BY MOUTH EVERY 8 HOURS AS  NEEDED 11/25/19  Yes Sowles, Drue Stager, MD  ADVAIR DISKUS 100-50 MCG/DOSE AEPB INHALE 1 PUFF BY MOUTH TWICE DAILY AS DIRECTED FOR ASTHMA. 10/07/20   [provider]  omeprazole (PRILOSEC) 20 MG capsule Take 20 mg by mouth as needed.  Patient not taking: No sig reported    [provider]    Physical Exam Vitals: Blood pressure 126/84, height 5\' 8"  (1.727 m), weight 198 lb (89.8 kg), last menstrual period 11/14/2020.  General: NAD HEENT: normocephalic, anicteric Thyroid: no enlargement, no palpable nodules Pulmonary: No increased work of breathing, CTAB Cardiovascular: RRR, distal pulses 2+ Breast: Breast symmetrical, no tenderness, no palpable nodules or masses, no skin or nipple retraction present, no nipple discharge.  No axillary or supraclavicular lymphadenopathy. Abdomen: NABS, soft, non-tender, non-distended.  Umbilicus without lesions.  No hepatomegaly, splenomegaly or masses palpable. No evidence of hernia  Genitourinary:  External: Normal external female genitalia.  Normal urethral meatus, normal Bartholin's and Skene's glands.    Vagina: Normal vaginal mucosa, no evidence of prolapse.    Cervix: Grossly normal in appearance, no bleeding  Uterus: Non-enlarged, mobile, normal contour.  No CMT  Adnexa: ovaries non-enlarged, no adnexal masses  Rectal: deferred  Lymphatic: no evidence of inguinal lymphadenopathy Extremities: no edema, erythema, or tenderness Neurologic: Grossly intact Psychiatric: mood appropriate, affect full   Assessment: 50 y.o. G1P1001 routine annual exam  Plan: Problem List Items Addressed This Visit   None   Visit Diagnoses    Well woman exam with routine gynecological exam    -  Primary   Relevant Orders   IGP, Aptima HPV   Screening for cervical cancer       Relevant Orders   IGP, Aptima HPV      1) Mammogram - recommend yearly screening mammogram.  Mammogram Is up to date  2) STI screening  was offered and declined  3) ASCCP  guidelines and rationale discussed.  Patient opts for yearly screening interval. PAP today  4) Contraception - the patient is currently using  none.  She is happy with her current form of contraception and plans to continue  5) Colonoscopy -- Screening recommended starting at age 41 for average risk individuals, age 35 for individuals deemed at increased risk (including African Americans) and recommended to continue until age 97.  For patient age 31-85 individualized approach is recommended.  Gold standard screening is via colonoscopy, Cologuard screening is an acceptable alternative for patient unwilling or unable to undergo colonoscopy.  "Colorectal cancer  screening for average?risk adults: 2018 guideline update from the American Cancer Society"CA: A Cancer Journal for Clinicians: Jan 17, 2017   6) Routine healthcare maintenance including cholesterol, diabetes screening discussed managed by PCP   7) Increase healthy lifestyle; exercise, hydration with h2o  8) Return in about 1 year (around 11/18/2021) for annual established gyn.   Rod Can, [facility] 11/18/2020, 10:03 AM

## 2020-11-23 LAB — IGP, APTIMA HPV: HPV Aptima: NEGATIVE

## 2021-01-04 ENCOUNTER — Ambulatory Visit (INDEPENDENT_AMBULATORY_CARE_PROVIDER_SITE_OTHER): Payer: Managed Care, Other (non HMO) | Admitting: Obstetrics and Gynecology

## 2021-01-04 ENCOUNTER — Other Ambulatory Visit: Payer: Self-pay

## 2021-01-04 ENCOUNTER — Encounter: Payer: Self-pay | Admitting: Obstetrics and Gynecology

## 2021-01-04 VITALS — BP 120/80 | Ht 68.0 in | Wt 200.0 lb

## 2021-01-04 DIAGNOSIS — R87618 Other abnormal cytological findings on specimens from cervix uteri: Secondary | ICD-10-CM

## 2021-01-04 NOTE — Progress Notes (Signed)
Endometrial Biopsy After discussion with the patient regarding her pap smear showing endometrial cells, I recommended that she proceed with an endometrial biopsy for further diagnosis. The risks, benefits, alternatives, and indications for an endometrial biopsy were discussed with the patient in detail. She understood the risks including infection, bleeding, cervical laceration and uterine perforation.  Verbal consent was obtained.   PROCEDURE NOTE:  [place] endometrial biopsy was performed using aseptic technique with iodine preparation.  The uterus was sounded to a length of 8 cm.  Adequate sampling was obtained with minimal blood loss.  The patient tolerated the procedure well.  Disposition will be pending pathology.  Prentice Docker, MD  [facility], [facility] 01/04/2021  9:27 AM

## 2021-01-06 LAB — ANATOMIC PATHOLOGY REPORT

## 2021-12-30 ENCOUNTER — Other Ambulatory Visit: Payer: Self-pay | Admitting: Family Medicine

## 2021-12-30 DIAGNOSIS — Z1231 Encounter for screening mammogram for malignant neoplasm of breast: Secondary | ICD-10-CM

## 2022-01-10 ENCOUNTER — Ambulatory Visit
Admission: RE | Admit: 2022-01-10 | Discharge: 2022-01-10 | Disposition: A | Discharge status: Home / Self Care | Payer: Managed Care, Other (non HMO) | Source: Ambulatory Visit | Attending: Family Medicine | Admitting: Family Medicine

## 2022-01-10 ENCOUNTER — Ambulatory Visit: Payer: Managed Care, Other (non HMO)

## 2022-01-10 DIAGNOSIS — Z1231 Encounter for screening mammogram for malignant neoplasm of breast: Secondary | ICD-10-CM | POA: Diagnosis present

## 2023-01-09 ENCOUNTER — Other Ambulatory Visit: Payer: Self-pay | Admitting: Family Medicine

## 2023-01-09 DIAGNOSIS — Z1231 Encounter for screening mammogram for malignant neoplasm of breast: Secondary | ICD-10-CM

## 2023-02-06 ENCOUNTER — Ambulatory Visit
Admission: RE | Admit: 2023-02-06 | Discharge: 2023-02-06 | Disposition: A | Discharge status: Home / Self Care | Payer: Managed Care, Other (non HMO) | Source: Ambulatory Visit | Attending: Family Medicine | Admitting: Family Medicine

## 2023-02-06 DIAGNOSIS — Z1231 Encounter for screening mammogram for malignant neoplasm of breast: Secondary | ICD-10-CM | POA: Insufficient documentation

## 2023-06-05 ENCOUNTER — Encounter: Payer: Self-pay | Admitting: Advanced Practice Midwife

## 2023-06-05 ENCOUNTER — Ambulatory Visit: Payer: Managed Care, Other (non HMO) | Admitting: Advanced Practice Midwife

## 2023-06-05 VITALS — BP 107/79 | HR 83 | Wt 185.7 lb

## 2023-06-05 DIAGNOSIS — Z124 Encounter for screening for malignant neoplasm of cervix: Secondary | ICD-10-CM

## 2023-06-05 DIAGNOSIS — Z01419 Encounter for gynecological examination (general) (routine) without abnormal findings: Secondary | ICD-10-CM

## 2023-06-05 NOTE — Progress Notes (Signed)
V

## 2023-06-05 NOTE — Progress Notes (Signed)
Gynecology Annual Exam  PCP: Lorn Junes, FNP  Chief Complaint: Annual Wellness Exam   History of Present Illness: Patient is a 52 y.o. G1P1001 presents for annual exam. She mentions her last period was in April of this year. She has hot flashes, however, frequency is decreasing. She also mentions some spots of blood after intercourse. Discussed likely due to cervical irritation.   LMP: April 2024  Dysmenorrhea: Right side pain lasting less than a day every couple of months   The patient is sexually active. She currently uses none for contraception. She admits to mild dyspareunia with penetration.  The patient does perform self breast exams.  There is no notable family history of breast or ovarian cancer in her family.  The patient wears seatbelts: yes.   The patient has regular exercise: she has limited exercise- she is more active during the summer. She reports dietary weaknesses of sugar and salt. She primarily drinks unsweetened decaf tea. Her sleep is sometimes interrupted with vasomotor symptoms.    The patient denies current symptoms of depression.    Review of Systems: Review of Systems  Constitutional:  Negative for chills and fever.  HENT:  Negative for congestion, ear discharge, ear pain, hearing loss, sinus pain and sore throat.   Eyes:  Negative for blurred vision and double vision.  Respiratory:  Negative for cough, shortness of breath and wheezing.   Cardiovascular:  Negative for chest pain, palpitations and leg swelling.  Gastrointestinal:  Negative for abdominal pain, blood in stool, constipation, diarrhea, heartburn, melena, nausea and vomiting.  Genitourinary:  Negative for dysuria, flank pain, frequency, hematuria and urgency.  Musculoskeletal:  Negative for back pain, joint pain and myalgias.  Skin:  Negative for itching and rash.  Neurological:  Negative for dizziness, tingling, tremors, sensory change, speech change, focal weakness, seizures, loss of  consciousness, weakness and headaches.  Endo/Heme/Allergies:  Negative for environmental allergies. Does not bruise/bleed easily.  Psychiatric/Behavioral:  Negative for depression, hallucinations, memory loss, substance abuse and suicidal ideas. The patient is not nervous/anxious and does not have insomnia.     Past Medical History:  Patient Active Problem List   Diagnosis Date Noted Date Diagnosed   Hyperlipidemia due to type 2 diabetes mellitus (HCC) 05/26/2019    Mild intermittent asthma without complication 05/26/2019    Bariatric surgery status 05/07/2018    Overweight (BMI 25.0-29.9) 02/01/2018    Essential hypertension 08/07/2017    Diverticulosis of large intestine without diverticulitis     Hx of microcytic hypochromic anemia 08/10/2016    Class 1 obesity due to excess calories with serious comorbidity and body mass index (BMI) of 30.0 to 30.9 in adult 08/10/2016    Hypothyroidism 07/03/2011     Overview:  Diagnosed 2011  Overview:  Diagnosed 2011    High cholesterol 07/03/2011    Type 2 diabetes mellitus (HCC) 07/03/2011    PCOS (polycystic ovarian syndrome) 07/03/2011     Past Surgical History:  Past Surgical History:  Procedure Laterality Date   BILATERAL CARPAL TUNNEL RELEASE  2007   CESAREAN SECTION  10/09/2010   FTP after IOL then had a PPH at [facility]   CHOLECYSTECTOMY  1999   COLONOSCOPY WITH PROPOFOL N/A 09/05/2016   Procedure: COLONOSCOPY WITH PROPOFOL;  Surgeon: Wyline Mood, MD;  Location: [facility] ENDOSCOPY;  Service: Endoscopy;  Laterality: N/A;. Diverticulosis   DILATION AND CURETTAGE OF UTERUS  08/18/2003   endometrial polyps and hyperplasia   ESOPHAGOGASTRODUODENOSCOPY (EGD) WITH PROPOFOL N/A 09/05/2016  Procedure: ESOPHAGOGASTRODUODENOSCOPY (EGD) WITH PROPOFOL;  Surgeon: Wyline Mood, MD;  Location: [facility] ENDOSCOPY;  Service: Endoscopy;  Laterality: N/A;   GASTRIC ROUX-EN-Y N/A 08/07/2017   Procedure: LAPAROSCOPIC ROUX-EN-Y GASTRIC BYPASS WITH UPPER ENDOSCOPY;   Surgeon: Gaynelle Adu, MD;  Location: [facility] ORS;  Service: General;  Laterality: N/A;   ROUX-EN-Y GASTRIC BYPASS  08/07/2017   Dr. Gaynelle Adu   SESMOIDECTOMY Left    fractured sesamoid bone removed from foot    Gynecologic History:  No LMP recorded.  Last Pap: 2022 Results were:  no abnormalities  Last mammogram: 02/06/2023 Results were: BI-RAD I  Obstetric History: G1P1001  Family History:  Family History  Problem Relation Age of Onset   Diabetes Mother    Hypertension Mother    Heart attack Mother 60       died from MI   Heart disease Sister 38   Cancer Brother 60       leukemia   Stroke Maternal Grandmother    Hypertension Maternal Grandmother    Diabetes Paternal Grandmother    Hypertension Paternal Grandmother    Stroke Paternal Grandmother        mini strokes   Ulcers Maternal Grandfather    Lymphoma Cousin    Hypertension Maternal Uncle    Kidney failure Maternal Uncle    Thyroid disease Paternal Aunt    Multiple sclerosis Paternal Aunt    COPD Neg Hx    Breast cancer Neg Hx     Social History:  Social History   Socioeconomic History   Marital status: Married    Spouse name: Bernette Redbird   Number of children: 1   Years of education: 14   Highest education level: Not on file  Occupational History   Occupation: office work  Tobacco Use   Smoking status: Former    Current packs/day: 0.00    Average packs/day: 1.5 packs/day for 20.0 years (30.0 ttl pk-yrs)    Types: Cigarettes    Start date: 09/10/1985    Quit date: 09/10/2005    Years since quitting: 17.7   Smokeless tobacco: Never   Tobacco comments:    quit 12 years ago   Vaping Use   Vaping status: Never Used  Substance and Sexual Activity   Alcohol use: Yes    Comment: rarely   Drug use: No   Sexual activity: Yes    Partners: Male    Birth control/protection: None    Comment: Nuvaring  Other Topics Concern   Not on file  Social History Narrative   Not on file   Social Determinants of Health    Financial Resource Strain: Low Risk  (10/03/2018)   Overall Financial Resource Strain (CARDIA)    Difficulty of Paying Living Expenses: Not hard at all  Food Insecurity: No Food Insecurity (10/03/2018)   Hunger Vital Sign    Worried About Running Out of Food in the Last Year: Never true    Ran Out of Food in the Last Year: Never true  Transportation Needs: No Transportation Needs (10/03/2018)   PRAPARE - Administrator, Civil Service (Medical): No    Lack of Transportation (Non-Medical): No  Physical Activity: Inactive (10/03/2018)   Exercise Vital Sign    Days of Exercise per Week: 0 days    Minutes of Exercise per Session: 0 min  Stress: Stress Concern Present (10/03/2018)   [employer] of Occupational Health - Occupational Stress Questionnaire    Feeling of Stress : Rather much  Social Connections:  Socially Integrated (10/03/2018)   Social Connection and Isolation Panel [NHANES]    Frequency of Communication with Friends and Family: More than three times a week    Frequency of Social Gatherings with Friends and Family: More than three times a week    Attends Religious Services: 1 to 4 times per year    Active Member of [employer] or Organizations: Yes    Attends Banker Meetings: More than 4 times per year    Marital Status: Married  Catering manager Violence: Not At Risk (10/03/2018)   Humiliation, Afraid, Rape, and Kick questionnaire    Fear of Current or Ex-Partner: No    Emotionally Abused: No    Physically Abused: No    Sexually Abused: No    Allergies:  Allergies  Allergen Reactions   Nsaids Other (See Comments)    Patient had gastric bypass surgery so she cannot tolerate NSAIDs   Adhesive [Tape] Itching    Skin peels off     Medications: Prior to Admission medications   Medication Sig Start Date End Date Taking? Authorizing Provider  albuterol (PROAIR HFA) 108 (90 Base) MCG/ACT inhaler Inhale 2 puffs into the lungs every 4 (four) hours as  needed for wheezing or shortness of breath. 06/05/17  Yes Doren Custard, FNP  atorvastatin (LIPITOR) 10 MG tablet Take 1 tablet (10 mg total) by mouth daily. 03/12/20  Yes Jamelle Haring, MD  calcium carbonate (TUMS - DOSED IN MG ELEMENTAL CALCIUM) 500 MG chewable tablet Chew 1 tablet by mouth 3 (three) times daily.   Yes [provider]  fluticasone (FLONASE) 50 MCG/ACT nasal spray SHAKE LIQUID AND USE 2 SPRAYS IN EACH NOSTRIL DAILY 01/12/20  Yes Sowles, Danna Hefty, MD  levothyroxine (SYNTHROID) 50 MCG tablet TAKE 1 TABLET(50 MCG) BY MOUTH DAILY BEFORE BREAKFAST 06/15/20  Yes Welford Roche D, MD  montelukast (SINGULAIR) 10 MG tablet TAKE 1 TABLET(10 MG) BY MOUTH AT BEDTIME 07/26/20  Yes Jamelle Haring, MD  Multiple Vitamin (MULTIVITAMIN) tablet Take 1 tablet by mouth 2 (two) times daily. (bariatric advantage) 08/20/17  Yes Lada, Janit Bern, MD  ondansetron (ZOFRAN) 4 MG tablet TAKE 1 TABLET(4 MG) BY MOUTH EVERY 8 HOURS AS NEEDED 11/25/19  Yes Sowles, Danna Hefty, MD  Semaglutide,0.25 or 0.5MG /DOS, (OZEMPIC, 0.25 OR 0.5 MG/DOSE,) 2 MG/1.5ML SOPN  12/15/20  Yes [provider]  SPIRIVA HANDIHALER 18 MCG inhalation capsule 1 capsule daily. 01/03/23  Yes [provider]    Physical Exam Vitals: Blood pressure 107/79, pulse 83, weight 185 lb 11.2 oz (84.2 kg).  General: NAD HEENT: normocephalic, anicteric Thyroid: no enlargement, no palpable nodules Pulmonary: No increased work of breathing, CTAB Cardiovascular: RRR, distal pulses 2+ Breast: Breast symmetrical, no tenderness, no palpable nodules or masses, no skin or nipple retraction present, no nipple discharge.  No axillary or supraclavicular lymphadenopathy. Abdomen: NABS, soft, non-tender, non-distended.  Umbilicus without lesions.  No hepatomegaly, splenomegaly or masses palpable. No evidence of hernia  Genitourinary:  External: Normal external female genitalia.  Normal urethral meatus, normal  Bartholin's and Skene's glands.    Vagina: Normal vaginal mucosa, no evidence of prolapse.    Cervix: Grossly normal in appearance, no bleeding Extremities: no edema, erythema, or tenderness Neurologic: Grossly intact Psychiatric: mood appropriate, affect full   Assessment: 52 y.o. G1P1001 routine annual exam  Plan: Problem List Items Addressed This Visit   None Visit Diagnoses     Well woman exam with routine gynecological exam    -  Primary  Relevant Orders   IGP, Aptima HPV   Cervical cancer screening       Relevant Orders   IGP, Aptima HPV       1) Mammogram - recommend yearly screening mammogram.  Mammogram  UTD through PCP  2) STI screening  was offered and declined  3) ASCCP guidelines and rationale discussed.  Patient opts for yearly screening interval- [facility] employee incentive program  4) Contraception - the patient is currently using  none.  She is happy with her current form of contraception and plans to continue  5) Colonoscopy -- Screening recommended starting at age 71 for average risk individuals, age 84 for individuals deemed at increased risk (including African Americans) and recommended to continue until age 28.  For patient age 11-85 individualized approach is recommended.  Gold standard screening is via colonoscopy, Cologuard screening is an acceptable alternative for patient unwilling or unable to undergo colonoscopy.  "Colorectal cancer screening for average?risk adults: 2018 guideline update from the American Cancer Society"[place]: A Cancer Journal for Clinicians: Jan 17, 2017   6) Routine healthcare maintenance including cholesterol, diabetes screening discussed managed by PCP  7) Return in about 1 year (around 06/04/2024) for annual.   Tresea Mall, CNM [facility] 06/05/2023 11:34 AM

## 2023-06-12 LAB — IGP, APTIMA HPV: HPV Aptima: NEGATIVE

## 2024-05-20 ENCOUNTER — Other Ambulatory Visit: Payer: Self-pay | Admitting: Family Medicine

## 2024-05-20 DIAGNOSIS — Z1231 Encounter for screening mammogram for malignant neoplasm of breast: Secondary | ICD-10-CM

## 2024-06-18 ENCOUNTER — Ambulatory Visit
Admission: RE | Admit: 2024-06-18 | Discharge: 2024-06-18 | Disposition: A | Discharge status: Home / Self Care | Source: Ambulatory Visit | Attending: Family Medicine | Admitting: Family Medicine

## 2024-06-18 DIAGNOSIS — Z1231 Encounter for screening mammogram for malignant neoplasm of breast: Secondary | ICD-10-CM | POA: Insufficient documentation
